# Patient Record
Sex: Female | Born: 1950
Health system: Southern US, Community
[De-identification: ages and names within clinical notes are randomized; demographics above are authoritative.]

## PROBLEM LIST (undated history)

## (undated) ENCOUNTER — Ambulatory Visit: Source: Home / Self Care

## (undated) DIAGNOSIS — E785 Hyperlipidemia, unspecified: Secondary | ICD-10-CM

## (undated) DIAGNOSIS — E119 Type 2 diabetes mellitus without complications: Secondary | ICD-10-CM

## (undated) DIAGNOSIS — Z8619 Personal history of other infectious and parasitic diseases: Secondary | ICD-10-CM

## (undated) DIAGNOSIS — M654 Radial styloid tenosynovitis [de Quervain]: Secondary | ICD-10-CM

## (undated) DIAGNOSIS — Z780 Asymptomatic menopausal state: Secondary | ICD-10-CM

## (undated) DIAGNOSIS — Z0000 Encounter for general adult medical examination without abnormal findings: Secondary | ICD-10-CM

## (undated) DIAGNOSIS — T8859XA Other complications of anesthesia, initial encounter: Secondary | ICD-10-CM

## (undated) DIAGNOSIS — R739 Hyperglycemia, unspecified: Secondary | ICD-10-CM

## (undated) DIAGNOSIS — Z9289 Personal history of other medical treatment: Secondary | ICD-10-CM

## (undated) DIAGNOSIS — M8580 Other specified disorders of bone density and structure, unspecified site: Secondary | ICD-10-CM

## (undated) DIAGNOSIS — C50919 Malignant neoplasm of unspecified site of unspecified female breast: Secondary | ICD-10-CM

## (undated) DIAGNOSIS — H6092 Unspecified otitis externa, left ear: Secondary | ICD-10-CM

## (undated) DIAGNOSIS — E8889 Other specified metabolic disorders: Secondary | ICD-10-CM

## (undated) HISTORY — DX: Personal history of other infectious and parasitic diseases: Z86.19

## (undated) HISTORY — DX: Personal history of other medical treatment: Z92.89

## (undated) HISTORY — DX: Other specified disorders of bone density and structure, unspecified site: M85.80

## (undated) HISTORY — DX: Encounter for general adult medical examination without abnormal findings: Z00.00

## (undated) HISTORY — DX: Unspecified otitis externa, left ear: H60.92

## (undated) HISTORY — DX: Hyperglycemia, unspecified: R73.9

## (undated) HISTORY — DX: Asymptomatic menopausal state: Z78.0

## (undated) HISTORY — DX: Hyperlipidemia, unspecified: E78.5

## (undated) HISTORY — DX: Radial styloid tenosynovitis (de quervain): M65.4

## (undated) HISTORY — DX: Other specified metabolic disorders: E88.89

## (undated) HISTORY — DX: Type 2 diabetes mellitus without complications: E11.9

## (undated) HISTORY — PX: SKIN SURGERY: SHX2413

## (undated) HISTORY — DX: Malignant neoplasm of unspecified site of unspecified female breast: C50.919

---

## 1970-11-01 HISTORY — PX: WISDOM TOOTH EXTRACTION: SHX21

## 2000-06-16 ENCOUNTER — Other Ambulatory Visit: Admission: RE | Admit: 2000-06-16 | Discharge: 2000-06-16 | Payer: Self-pay | Admitting: Obstetrics and Gynecology

## 2008-01-31 DIAGNOSIS — C50919 Malignant neoplasm of unspecified site of unspecified female breast: Secondary | ICD-10-CM

## 2008-01-31 DIAGNOSIS — Z853 Personal history of malignant neoplasm of breast: Secondary | ICD-10-CM | POA: Insufficient documentation

## 2008-01-31 HISTORY — DX: Malignant neoplasm of unspecified site of unspecified female breast: C50.919

## 2008-02-14 ENCOUNTER — Other Ambulatory Visit: Admission: RE | Admit: 2008-02-14 | Discharge: 2008-02-14 | Payer: Self-pay | Admitting: *Deleted

## 2008-03-01 HISTORY — PX: BREAST LUMPECTOMY: SHX2

## 2008-03-05 ENCOUNTER — Ambulatory Visit: Admission: RE | Admit: 2008-03-05 | Discharge: 2008-04-08 | Payer: Self-pay | Admitting: Radiation Oncology

## 2008-03-11 ENCOUNTER — Ambulatory Visit (HOSPITAL_COMMUNITY): Admission: RE | Admit: 2008-03-11 | Discharge: 2008-03-11 | Payer: Self-pay | Admitting: Surgery

## 2008-03-11 ENCOUNTER — Encounter (INDEPENDENT_AMBULATORY_CARE_PROVIDER_SITE_OTHER): Payer: Self-pay | Admitting: Surgery

## 2008-03-21 ENCOUNTER — Ambulatory Visit: Payer: Self-pay | Admitting: Oncology

## 2008-04-17 LAB — CBC WITH DIFFERENTIAL/PLATELET
BASO%: 0.2 % (ref 0.0–2.0)
EOS%: 2.9 % (ref 0.0–7.0)
HCT: 40.8 % (ref 34.8–46.6)
LYMPH%: 29.4 % (ref 14.0–48.0)
MCH: 29.1 pg (ref 26.0–34.0)
MCHC: 34.4 g/dL (ref 32.0–36.0)
NEUT%: 57.7 % (ref 39.6–76.8)
RBC: 4.82 10*6/uL (ref 3.70–5.32)
lymph#: 1.5 10*3/uL (ref 0.9–3.3)

## 2008-04-18 LAB — COMPREHENSIVE METABOLIC PANEL
ALT: 26 U/L (ref 0–35)
AST: 19 U/L (ref 0–37)
Chloride: 104 mEq/L (ref 96–112)
Creatinine, Ser: 0.88 mg/dL (ref 0.40–1.20)
Total Bilirubin: 0.6 mg/dL (ref 0.3–1.2)

## 2008-04-23 ENCOUNTER — Ambulatory Visit (HOSPITAL_COMMUNITY): Admission: RE | Admit: 2008-04-23 | Discharge: 2008-04-23 | Payer: Self-pay | Admitting: Oncology

## 2008-05-02 ENCOUNTER — Ambulatory Visit: Payer: Self-pay | Admitting: Oncology

## 2008-08-22 ENCOUNTER — Ambulatory Visit: Payer: Self-pay | Admitting: Oncology

## 2008-08-26 LAB — CBC WITH DIFFERENTIAL/PLATELET
Basophils Absolute: 0 10*3/uL (ref 0.0–0.1)
HCT: 39 % (ref 34.8–46.6)
HGB: 13.4 g/dL (ref 11.6–15.9)
LYMPH%: 34.8 % (ref 14.0–48.0)
MCHC: 34.4 g/dL (ref 32.0–36.0)
MONO#: 0.6 10*3/uL (ref 0.1–0.9)
NEUT%: 51 % (ref 39.6–76.8)
Platelets: 351 10*3/uL (ref 145–400)
WBC: 5.7 10*3/uL (ref 3.9–10.0)
lymph#: 2 10*3/uL (ref 0.9–3.3)

## 2008-08-27 LAB — COMPREHENSIVE METABOLIC PANEL
ALT: 24 U/L (ref 0–35)
AST: 17 U/L (ref 0–37)
Albumin: 4.9 g/dL (ref 3.5–5.2)
Alkaline Phosphatase: 65 U/L (ref 39–117)
Potassium: 4 mEq/L (ref 3.5–5.3)
Total Protein: 7.4 g/dL (ref 6.0–8.3)

## 2008-09-21 ENCOUNTER — Encounter: Admission: RE | Admit: 2008-09-21 | Discharge: 2008-09-21 | Payer: Self-pay | Admitting: Family Medicine

## 2009-01-22 ENCOUNTER — Ambulatory Visit: Payer: Self-pay | Admitting: Oncology

## 2009-01-24 LAB — CBC WITH DIFFERENTIAL/PLATELET
Basophils Absolute: 0 10*3/uL (ref 0.0–0.1)
EOS%: 2.4 % (ref 0.0–7.0)
HCT: 39 % (ref 34.8–46.6)
HGB: 13.2 g/dL (ref 11.6–15.9)
MCH: 29 pg (ref 25.1–34.0)
MCV: 85.8 fL (ref 79.5–101.0)
MONO%: 10.5 % (ref 0.0–14.0)
NEUT%: 47.5 % (ref 38.4–76.8)

## 2009-01-27 LAB — COMPREHENSIVE METABOLIC PANEL
AST: 19 U/L (ref 0–37)
Alkaline Phosphatase: 68 U/L (ref 39–117)
BUN: 22 mg/dL (ref 6–23)
Calcium: 10.1 mg/dL (ref 8.4–10.5)
Chloride: 104 mEq/L (ref 96–112)
Creatinine, Ser: 1 mg/dL (ref 0.40–1.20)

## 2009-08-06 ENCOUNTER — Ambulatory Visit: Payer: Self-pay | Admitting: Oncology

## 2009-08-11 LAB — CBC WITH DIFFERENTIAL/PLATELET
Eosinophils Absolute: 0.1 10*3/uL (ref 0.0–0.5)
HCT: 39.2 % (ref 34.8–46.6)
HGB: 13.4 g/dL (ref 11.6–15.9)
LYMPH%: 33 % (ref 14.0–49.7)
MONO#: 0.5 10*3/uL (ref 0.1–0.9)
NEUT#: 3.5 10*3/uL (ref 1.5–6.5)
NEUT%: 55.9 % (ref 38.4–76.8)
Platelets: 250 10*3/uL (ref 145–400)
WBC: 6.3 10*3/uL (ref 3.9–10.3)

## 2009-08-12 LAB — COMPREHENSIVE METABOLIC PANEL
CO2: 23 mEq/L (ref 19–32)
Calcium: 10.1 mg/dL (ref 8.4–10.5)
Chloride: 104 mEq/L (ref 96–112)
Creatinine, Ser: 0.87 mg/dL (ref 0.40–1.20)
Glucose, Bld: 106 mg/dL — ABNORMAL HIGH (ref 70–99)
Sodium: 139 mEq/L (ref 135–145)
Total Bilirubin: 0.7 mg/dL (ref 0.3–1.2)
Total Protein: 7.1 g/dL (ref 6.0–8.3)

## 2009-08-12 LAB — LACTATE DEHYDROGENASE: LDH: 172 U/L (ref 94–250)

## 2010-02-16 ENCOUNTER — Ambulatory Visit: Payer: Self-pay | Admitting: Oncology

## 2010-02-18 LAB — LACTATE DEHYDROGENASE: LDH: 156 U/L (ref 94–250)

## 2010-02-18 LAB — COMPREHENSIVE METABOLIC PANEL
ALT: 28 U/L (ref 0–35)
Albumin: 4.6 g/dL (ref 3.5–5.2)
BUN: 18 mg/dL (ref 6–23)
CO2: 25 mEq/L (ref 19–32)
Calcium: 9.6 mg/dL (ref 8.4–10.5)
Chloride: 102 mEq/L (ref 96–112)
Glucose, Bld: 124 mg/dL — ABNORMAL HIGH (ref 70–99)
Potassium: 4.2 mEq/L (ref 3.5–5.3)
Total Bilirubin: 0.8 mg/dL (ref 0.3–1.2)
Total Protein: 7.1 g/dL (ref 6.0–8.3)

## 2010-02-18 LAB — CBC WITH DIFFERENTIAL/PLATELET
HCT: 40.9 % (ref 34.8–46.6)
LYMPH%: 30.7 % (ref 14.0–49.7)
MCH: 29.4 pg (ref 25.1–34.0)
MONO%: 9.6 % (ref 0.0–14.0)
NEUT#: 3.6 10*3/uL (ref 1.5–6.5)
NEUT%: 54.6 % (ref 38.4–76.8)
WBC: 6.5 10*3/uL (ref 3.9–10.3)
lymph#: 2 10*3/uL (ref 0.9–3.3)

## 2010-02-18 LAB — VITAMIN D 25 HYDROXY (VIT D DEFICIENCY, FRACTURES): Vit D, 25-Hydroxy: 50 ng/mL (ref 30–89)

## 2010-02-18 LAB — CANCER ANTIGEN 27.29: CA 27.29: 21 U/mL (ref 0–39)

## 2010-08-25 ENCOUNTER — Ambulatory Visit: Payer: Self-pay | Admitting: Oncology

## 2010-08-27 LAB — CBC WITH DIFFERENTIAL/PLATELET
BASO%: 0.6 % (ref 0.0–2.0)
Eosinophils Absolute: 0.2 10*3/uL (ref 0.0–0.5)
HCT: 40.2 % (ref 34.8–46.6)
MCH: 30 pg (ref 25.1–34.0)
MCHC: 34.5 g/dL (ref 31.5–36.0)
MONO%: 10.3 % (ref 0.0–14.0)
NEUT#: 2.9 10*3/uL (ref 1.5–6.5)
RDW: 12.8 % (ref 11.2–14.5)
WBC: 5.5 10*3/uL (ref 3.9–10.3)

## 2010-08-28 LAB — COMPREHENSIVE METABOLIC PANEL
ALT: 20 U/L (ref 0–35)
AST: 20 U/L (ref 0–37)
Albumin: 4.7 g/dL (ref 3.5–5.2)
Alkaline Phosphatase: 69 U/L (ref 39–117)
CO2: 25 mEq/L (ref 19–32)
Calcium: 10.8 mg/dL — ABNORMAL HIGH (ref 8.4–10.5)
Chloride: 101 mEq/L (ref 96–112)
Creatinine, Ser: 0.96 mg/dL (ref 0.40–1.20)
Potassium: 4.1 mEq/L (ref 3.5–5.3)
Total Protein: 7.2 g/dL (ref 6.0–8.3)

## 2010-11-05 ENCOUNTER — Ambulatory Visit: Payer: Self-pay | Admitting: Oncology

## 2011-03-11 ENCOUNTER — Ambulatory Visit (INDEPENDENT_AMBULATORY_CARE_PROVIDER_SITE_OTHER): Payer: BC Managed Care – PPO | Admitting: Internal Medicine

## 2011-03-11 DIAGNOSIS — N951 Menopausal and female climacteric states: Secondary | ICD-10-CM

## 2011-03-11 DIAGNOSIS — E789 Disorder of lipoprotein metabolism, unspecified: Secondary | ICD-10-CM

## 2011-03-11 DIAGNOSIS — M899 Disorder of bone, unspecified: Secondary | ICD-10-CM

## 2011-03-15 ENCOUNTER — Encounter: Payer: Self-pay | Admitting: Internal Medicine

## 2011-03-16 NOTE — Op Note (Signed)
NAMEROSALINE, EZEKIEL                  ACCOUNT NO.:  [id number]   MEDICAL RECORD NO.:  [id number]          PATIENT TYPE:  AMB   LOCATION:  SDS                          FACILITY:  [facility]   PHYSICIAN:  Wilmon Arms. Corliss Skains, M.D. DATE OF BIRTH:  1951/03/04   DATE OF PROCEDURE:  03/11/2008  DATE OF DISCHARGE:  03/11/2008                               OPERATIVE REPORT   PREOPERATIVE DIAGNOSIS:  Right breast cancer.   POSTOPERATIVE DIAGNOSIS:  Right breast cancer.   PROCEDURE PERFORMED:  1. Blue dye injection.  2. Right axillary sentinel lymph node biopsy.  3. Right partial mastectomy.  4. MammoSite cavity evaluation device placement.  5. Intraoperative ultrasound.   SURGEON:  Wynona Luna, MD   ASSISTANT:  Kelle Darting. Rennis Harding, NP and Dr. Cicero Duck assisted with the  placement of a MammoSite balloon.   ANESTHESIA:  General endotracheal.   INDICATIONS:  The patient is a 60 year old female who presented with a  palpable mass in right breast.  She underwent extensive diagnostic  workup with several attempts at needle biopsy.  She finally had a  stereotactic core biopsy which returned a diagnosis of invasive mammary  cancer.  A breast-specific gamma imaging at Dr. Shirlee More office showed  only a single lesion with no other areas of activity.  She presents  today for partial mastectomy and sentinel lymph node biopsy MammoSite  balloon placement.  She was given preoperative antibiotics with Ancef.  She also had an injection of the radionuclide tracer in the holding  area.   DESCRIPTION OF PROCEDURE:  The patient was brought to the operating  room, placed in the supine position on operating table.  After an  adequate level of general anesthesia was obtained, the patient's right  breast was prepped with ChloraPrep.  A 5 mL of methylene blue were  injected subdermally under the nipple.  The breast was then massaged for  5 minutes.  The breast, axilla, and upper arm were then prepped with  Betadine and draped in a sterile fashion.  A time-out was taken to  assure proper patient and proper procedure.  The gamma probe was then  used to interrogate the axilla.  We made an incision over the area of  the most activity.  Dissection was carried down into the axillary  fascia.  Deep in the axilla, we encountered a blue dye with activity of  1450.  This was carefully excised.  This was marked as sentinel lymph  node #1.  We then interrogated the axilla again with the gamma probe.  There seemed to be more activity.  We spent some time searching for  another hot blue node.  However, we could not visualize another blue  node.  We did locate another lymph node and this had activity up to 981.  This was marked as sentinel lymph node #2.  Please note that this did  not seem to contain blue dye.  We then irrigated the axilla and packed  it with sponge.  We turned our attention to the partial mastectomy.  We  made a transverse incision  over the palpable mass.  Skin flaps were  raised in all directions.  We left about 1 centimeter of subcutaneous  tissue and breast tissue under the dermis.  We then went out laterally  in all directions from around the palpable mass.  We took this down to  just above the pectoralis fascia.  The specimen was removed in its  entirety.  The blue dye from the previous subareolar injection had  stained the inferior margin of the specimen.  The long suture was then  used to mark the lateral margin and the short suture superior.  The  specimen was then sent for pathologic examination.  At this point, we  received a verbal report that both sentinel lymph nodes were negative.  The wound was then thoroughly irrigated.  Hemostasis was obtained with  cautery.  We inserted the MammoSite cavity evaluation device through a  lateral stab incision into the wound.  We inflated to 35 cm and this  seemed to fill the wound fairly well.  There was a little undermining  laterally  which was closed with a single 3-0 Vicryl suture.  The balloon  was then deflated and the wound was closed with a deep layer of 3-0  Vicryl, maintaining about a 1-cm tissue margin anteriorly.  A 4-0  Monocryl was used to close the skin incisions.  Intraoperative  ultrasound was then used to interrogate the balloon.  There seemed to be  a 11-mm of tissue margin anteriorly.  There seemed to be several  millimeters of posterior margin on the chest wall.  The balloon had been  filled with 35 mL of saline mixed with radiopaque dye.  The axillary  incision was then closed with a deep layer of 3-0 Vicryl and  subcuticular 4-0 Monocryl.  Steri-Strips and clean dressings were  applied.  The patient was then extubated and brought to recovery room in  stable condition.  All sponge, instrument, and needle counts were  correct.      Wilmon Arms. Tsuei, M.D.  Electronically Signed     MKT/MEDQ  D:  03/11/2008  T:  03/12/2008  Job:  [id number]

## 2011-03-23 ENCOUNTER — Ambulatory Visit (INDEPENDENT_AMBULATORY_CARE_PROVIDER_SITE_OTHER): Payer: BC Managed Care – PPO | Admitting: Internal Medicine

## 2011-03-23 DIAGNOSIS — E785 Hyperlipidemia, unspecified: Secondary | ICD-10-CM

## 2011-03-23 DIAGNOSIS — N951 Menopausal and female climacteric states: Secondary | ICD-10-CM

## 2011-03-23 DIAGNOSIS — M899 Disorder of bone, unspecified: Secondary | ICD-10-CM

## 2011-03-23 DIAGNOSIS — R7309 Other abnormal glucose: Secondary | ICD-10-CM

## 2011-04-08 ENCOUNTER — Encounter (INDEPENDENT_AMBULATORY_CARE_PROVIDER_SITE_OTHER): Payer: Self-pay | Admitting: Surgery

## 2011-04-26 ENCOUNTER — Ambulatory Visit: Payer: BC Managed Care – PPO | Admitting: *Deleted

## 2011-05-04 ENCOUNTER — Encounter: Payer: BC Managed Care – PPO | Attending: Internal Medicine | Admitting: *Deleted

## 2011-05-04 DIAGNOSIS — E785 Hyperlipidemia, unspecified: Secondary | ICD-10-CM | POA: Insufficient documentation

## 2011-05-04 DIAGNOSIS — Z713 Dietary counseling and surveillance: Secondary | ICD-10-CM | POA: Insufficient documentation

## 2011-05-04 DIAGNOSIS — R7309 Other abnormal glucose: Secondary | ICD-10-CM | POA: Insufficient documentation

## 2011-05-14 ENCOUNTER — Encounter (HOSPITAL_BASED_OUTPATIENT_CLINIC_OR_DEPARTMENT_OTHER): Payer: BC Managed Care – PPO | Admitting: Oncology

## 2011-05-14 ENCOUNTER — Other Ambulatory Visit: Payer: Self-pay | Admitting: Oncology

## 2011-05-14 DIAGNOSIS — Z170 Estrogen receptor positive status [ER+]: Secondary | ICD-10-CM

## 2011-05-14 DIAGNOSIS — C50419 Malignant neoplasm of upper-outer quadrant of unspecified female breast: Secondary | ICD-10-CM

## 2011-05-14 LAB — COMPREHENSIVE METABOLIC PANEL
ALT: 35 U/L (ref 0–35)
AST: 30 U/L (ref 0–37)
Albumin: 4.9 g/dL (ref 3.5–5.2)
BUN: 18 mg/dL (ref 6–23)
Chloride: 102 mEq/L (ref 96–112)
Creatinine, Ser: 0.93 mg/dL (ref 0.50–1.10)
Glucose, Bld: 123 mg/dL — ABNORMAL HIGH (ref 70–99)
Sodium: 140 mEq/L (ref 135–145)
Total Protein: 7.2 g/dL (ref 6.0–8.3)

## 2011-05-14 LAB — CBC WITH DIFFERENTIAL/PLATELET
Basophils Absolute: 0 10*3/uL (ref 0.0–0.1)
Eosinophils Absolute: 0.2 10*3/uL (ref 0.0–0.5)
HCT: 40.1 % (ref 34.8–46.6)
MCH: 29.8 pg (ref 25.1–34.0)
NEUT#: 5 10*3/uL (ref 1.5–6.5)
NEUT%: 61.9 % (ref 38.4–76.8)
Platelets: 254 10*3/uL (ref 145–400)

## 2011-05-24 ENCOUNTER — Encounter (INDEPENDENT_AMBULATORY_CARE_PROVIDER_SITE_OTHER): Payer: Self-pay | Admitting: Surgery

## 2011-05-26 ENCOUNTER — Encounter (INDEPENDENT_AMBULATORY_CARE_PROVIDER_SITE_OTHER): Payer: Self-pay | Admitting: Surgery

## 2011-05-26 ENCOUNTER — Ambulatory Visit (INDEPENDENT_AMBULATORY_CARE_PROVIDER_SITE_OTHER): Payer: BC Managed Care – PPO | Admitting: Surgery

## 2011-05-26 ENCOUNTER — Ambulatory Visit: Payer: BC Managed Care – PPO | Admitting: Internal Medicine

## 2011-05-26 VITALS — BP 134/86 | HR 84 | Temp 97.4°F | Ht 62.0 in | Wt 171.8 lb

## 2011-05-26 DIAGNOSIS — Z853 Personal history of malignant neoplasm of breast: Secondary | ICD-10-CM | POA: Insufficient documentation

## 2011-05-26 NOTE — Patient Instructions (Addendum)
Follow-up with Dr. Donnie Coffin as instructed.

## 2011-05-26 NOTE — Progress Notes (Signed)
Followup of her right breast cancer, T1 C., N0, ER/PR positive, status post lumpectomy and MammoSite. She is in year 3 of Femara. Her last mammogram in May of this year was unremarkable. She continues to have firm scar tissue at her right lumpectomy site which seems to be slightly smaller in size. This is felt to be residual effect of the MammoSite treatment.  On examination her right lumpectomy incision is well healed. She has approximately 3-4 cm firm scar tissue under her incision. No other palpable masses in either breast. No lymphadenopathy. She has slight nipple retraction on the right due to the scar tissue but this is unchanged.  The patient follows with Dr. Donnie Coffin every 6 months. We will see her back on a p.r.n. basis.

## 2011-06-01 ENCOUNTER — Encounter (HOSPITAL_BASED_OUTPATIENT_CLINIC_OR_DEPARTMENT_OTHER): Payer: BC Managed Care – PPO | Admitting: Oncology

## 2011-06-03 ENCOUNTER — Ambulatory Visit: Payer: BC Managed Care – PPO | Admitting: Internal Medicine

## 2011-06-10 ENCOUNTER — Encounter: Payer: Self-pay | Admitting: Emergency Medicine

## 2011-06-10 ENCOUNTER — Other Ambulatory Visit (INDEPENDENT_AMBULATORY_CARE_PROVIDER_SITE_OTHER): Payer: BC Managed Care – PPO

## 2011-06-10 DIAGNOSIS — E785 Hyperlipidemia, unspecified: Secondary | ICD-10-CM

## 2011-06-10 DIAGNOSIS — R739 Hyperglycemia, unspecified: Secondary | ICD-10-CM

## 2011-06-10 DIAGNOSIS — N951 Menopausal and female climacteric states: Secondary | ICD-10-CM

## 2011-06-10 DIAGNOSIS — R7309 Other abnormal glucose: Secondary | ICD-10-CM

## 2011-06-10 LAB — CBC WITH DIFFERENTIAL/PLATELET
Basophils Absolute: 0.1 10*3/uL (ref 0.0–0.1)
Basophils Relative: 1 % (ref 0–1)
Eosinophils Absolute: 0.2 10*3/uL (ref 0.0–0.7)
MCH: 29.4 pg (ref 26.0–34.0)
MCHC: 33.3 g/dL (ref 30.0–36.0)
Neutro Abs: 2.6 10*3/uL (ref 1.7–7.7)
Neutrophils Relative %: 52 % (ref 43–77)
Platelets: 286 10*3/uL (ref 150–400)
RDW: 12.9 % (ref 11.5–15.5)

## 2011-06-10 LAB — LIPID PANEL
HDL: 52 mg/dL (ref >39)
LDL Cholesterol: 110 mg/dL — ABNORMAL HIGH (ref 0–99)
Total CHOL/HDL Ratio: 3.6 Ratio
Triglycerides: 134 mg/dL (ref <150)

## 2011-06-10 LAB — COMPREHENSIVE METABOLIC PANEL
ALT: 26 U/L (ref 0–35)
AST: 21 U/L (ref 0–37)
Alkaline Phosphatase: 67 U/L (ref 39–117)
Glucose, Bld: 118 mg/dL — ABNORMAL HIGH (ref 70–99)
Sodium: 138 mEq/L (ref 135–145)
Total Bilirubin: 1.2 mg/dL (ref 0.3–1.2)
Total Protein: 7.4 g/dL (ref 6.0–8.3)

## 2011-06-10 NOTE — Progress Notes (Signed)
This encounter was created in error - please disregard.  This encounter was created in error - please disregard.

## 2011-06-11 LAB — HEMOGLOBIN A1C: Mean Plasma Glucose: 151 mg/dL — ABNORMAL HIGH (ref <117)

## 2011-06-17 ENCOUNTER — Ambulatory Visit (INDEPENDENT_AMBULATORY_CARE_PROVIDER_SITE_OTHER): Payer: BC Managed Care – PPO | Admitting: Internal Medicine

## 2011-06-17 ENCOUNTER — Encounter: Payer: Self-pay | Admitting: Internal Medicine

## 2011-06-17 DIAGNOSIS — Z1272 Encounter for screening for malignant neoplasm of vagina: Secondary | ICD-10-CM

## 2011-06-17 DIAGNOSIS — Z01419 Encounter for gynecological examination (general) (routine) without abnormal findings: Secondary | ICD-10-CM

## 2011-06-17 DIAGNOSIS — R6882 Decreased libido: Secondary | ICD-10-CM

## 2011-06-17 DIAGNOSIS — M8580 Other specified disorders of bone density and structure, unspecified site: Secondary | ICD-10-CM

## 2011-06-17 DIAGNOSIS — E785 Hyperlipidemia, unspecified: Secondary | ICD-10-CM

## 2011-06-17 DIAGNOSIS — Z113 Encounter for screening for infections with a predominantly sexual mode of transmission: Secondary | ICD-10-CM

## 2011-06-17 DIAGNOSIS — R7309 Other abnormal glucose: Secondary | ICD-10-CM

## 2011-06-17 DIAGNOSIS — N951 Menopausal and female climacteric states: Secondary | ICD-10-CM

## 2011-06-17 DIAGNOSIS — M949 Disorder of cartilage, unspecified: Secondary | ICD-10-CM

## 2011-06-17 DIAGNOSIS — Z780 Asymptomatic menopausal state: Secondary | ICD-10-CM | POA: Insufficient documentation

## 2011-06-17 DIAGNOSIS — R7302 Impaired glucose tolerance (oral): Secondary | ICD-10-CM

## 2011-06-17 DIAGNOSIS — M654 Radial styloid tenosynovitis [de Quervain]: Secondary | ICD-10-CM | POA: Insufficient documentation

## 2011-06-17 DIAGNOSIS — E119 Type 2 diabetes mellitus without complications: Secondary | ICD-10-CM | POA: Insufficient documentation

## 2011-06-17 NOTE — Progress Notes (Signed)
Subjective:    Patient ID: Priscilla Butler, female    DOB: 05/07/1951, 60 y.o.   MRN: [id number]  HPI  Priscilla Butler is here for comprehensive evaluation.  Overall doing welll, she is exercising but admits to dietary indiscretion over the last 3 months.  She states FBS at home mostly in 120's occasionally 130's at home.  See AIC  6.9%.  She has no symptoms of polyuria, polyphagia, polydipsia.  Not sure if father had diabetes, no other pos. FH of diabetes.  She has low libido and would like testosterone checked.  No c/o's excessive vaginal dryness.  She discussed a drug holiday with Dr. Donnie Coffin for her osteopenia and he is in agreement  No Known Allergies Past Medical History  Diagnosis Date  . Hyperglycemia   . Hyperlipidemia   . Menopause   . Osteopenia   . DeQuervain's disease (tenosynovitis)   . Breast cancer 01/2008    Stage I, right breast   Past Surgical History  Procedure Date  . Wisdom tooth extraction 1972  . Breast lumpectomy 03/2008    Right breast   History   Social History  . Marital Status: Married    Spouse Name: Claris Gladden    Number of Children: 1  . Years of Education: BA   Occupational History  . Retired    Social History Main Topics  . Smoking status: Former Smoker    Quit date: 11/02/1999  . Smokeless tobacco: Never Used  . Alcohol Use: 1.8 oz/week    3 Cans of beer per week  . Drug Use: No  . Sexually Active: Yes -- Female partner(s)    Birth Control/ Protection: Post-menopausal   Other Topics Concern  . Not on file   Social History Narrative  . No narrative on file   Family History  Problem Relation Age of Onset  . Breast cancer Maternal Grandmother   . Cancer Maternal Grandfather     lung   Patient Active Problem List  Diagnoses  . HX: breast cancer   Current Outpatient Prescriptions on File Prior to Visit  Medication Sig Dispense Refill  . alendronate (FOSAMAX) 70 MG tablet Take 70 mg by mouth every 7 (seven) days. Take with a full glass of water on  an empty stomach.       Marland Kitchen aspirin 81 MG tablet Take 81 mg by mouth daily.        . calcium-vitamin D (OSCAL WITH D) 500-200 MG-UNIT per tablet Take 1 tablet by mouth daily.        . Cholecalciferol (VITAMIN D3) 1000 UNITS CAPS Take 1 capsule by mouth daily.        Marland Kitchen letrozole (FEMARA) 2.5 MG tablet Take 2.5 mg by mouth daily.        . Multiple Vitamin (MULTIVITAMIN) tablet Take 1 tablet by mouth daily.        . Omega-3 Fatty Acids (FISH OIL) 1000 MG CAPS Take 1 capsule by mouth 2 (two) times daily.        . simvastatin (ZOCOR) 40 MG tablet Take 40 mg by mouth at bedtime.             Review of Systems No Chest pain no SOB, no LE edema    Objective:   Physical Exam Physical Exam  Vital signs and nursing note reviewed  Constitutional: She is oriented to person, place, and time. She appears well-developed and well-nourished. She is cooperative.  HENT:  Head: Normocephalic and atraumatic.  Right Ear:  Tympanic membrane normal.  Left Ear: Tympanic membrane normal.  Nose: Nose normal.  Mouth/Throat: Oropharynx is clear and moist and mucous membranes are normal. No oropharyngeal exudate or posterior oropharyngeal erythema.  Eyes: Conjunctivae and EOM are normal. Pupils are equal, round, and reactive to light.  Neck: Neck supple. No JVD present. Carotid bruit is not present. No mass and no thyromegaly present.  Cardiovascular: Regular rhythm, normal heart sounds, intact distal pulses and normal pulses.  Exam reveals no gallop and no friction rub.   No murmur heard. Pulses:      Dorsalis pedis pulses are 2+ on the right side, and 2+ on the left side.  Pulmonary/Chest: Breath sounds normal. She has no wheezes. She has no rhonchi. She has no rales. Right breast exhibits no mass, no nipple discharge and no skin change. Left breast exhibits no mass, no nipple discharge and no skin change.  Abdominal: Soft. Bowel sounds are normal. She exhibits no distension and no mass. There is no  hepatosplenomegaly. There is no tenderness. There is no CVA tenderness.  Genitourinary: Rectum normal, vagina normal and uterus normal. Rectal exam shows no mass. Guaiac negative stool. No labial fusion. There is no lesion on the right labia. There is no lesion on the left labia. Cervix exhibits no motion tenderness. Right adnexum displays no mass, no tenderness and no fullness. Left adnexum displays no mass, no tenderness and no fullness. No erythema around the vagina.  Musculoskeletal:       No active synovitis to any joint.    Lymphadenopathy:       Right cervical: No superficial cervical adenopathy present.      Left cervical: No superficial cervical adenopathy present.       Right axillary: No pectoral and no lateral adenopathy present.       Left axillary: No pectoral and no lateral adenopathy present.      Right: No inguinal adenopathy present.       Left: No inguinal adenopathy present.  Neurological: She is alert and oriented to person, place, and time. She has normal strength and normal reflexes. No cranial nerve deficit or sensory deficit. She displays a negative Romberg sign. Coordination and gait normal.  Skin: Skin is warm and dry. No abrasion, no bruising, no ecchymosis and no rash noted. No cyanosis. Nails show no clubbing.  Psychiatric: She has a normal mood and affect. Her speech is normal and behavior is normal.          Assessment & Plan:   1)  HM See scanned HM sheet.  Bone density due in October.  OK to stop allendronate for now after having been on for 6 years.  UTD on Tdap and advised flu vaccine in fall.  UTD on mammogram, due for colonoscopy next year.  Pap today 2)  Impaired glucose with elevated AIC.  Will refer to Dr. Talmage Nap for possible GTT 3) Hyperlipidemis  Adequate control 4)  Stage I breast CA  Last mammogram neg.  Followed by Dr. Donnie Coffin 5)  Low libido  Will check testosterone today 6)  Menopause        Assessment & Plan:

## 2011-06-17 NOTE — Patient Instructions (Signed)
Call office for doctor order when due for bone density  Will refer to Dr. Talmage Nap for evaluation of  Impaired glucose tolerance  Take medicine as prescribed.  Office visit in 3-6 months

## 2011-06-22 ENCOUNTER — Encounter: Payer: Self-pay | Admitting: Emergency Medicine

## 2011-06-24 ENCOUNTER — Encounter: Payer: Self-pay | Admitting: Emergency Medicine

## 2011-08-06 ENCOUNTER — Inpatient Hospital Stay (INDEPENDENT_AMBULATORY_CARE_PROVIDER_SITE_OTHER)
Admission: RE | Admit: 2011-08-06 | Discharge: 2011-08-06 | Disposition: A | Discharge status: Home / Self Care | Payer: BC Managed Care – PPO | Source: Ambulatory Visit | Attending: Emergency Medicine | Admitting: Emergency Medicine

## 2011-08-06 DIAGNOSIS — N390 Urinary tract infection, site not specified: Secondary | ICD-10-CM

## 2011-08-06 LAB — POCT URINALYSIS DIP (DEVICE)
Bilirubin Urine: NEGATIVE
Glucose, UA: NEGATIVE mg/dL
Specific Gravity, Urine: 1.01 (ref 1.005–1.030)

## 2011-08-26 ENCOUNTER — Telehealth: Payer: Self-pay | Admitting: Internal Medicine

## 2011-08-26 NOTE — Telephone Encounter (Signed)
Please call pt and let her know that her bone density still shows osteopenia (thinning) but has improved from last check.  Continue calciuma nd vit d

## 2011-08-27 NOTE — Telephone Encounter (Signed)
Pt aware of results, will continue calcium and vitamin D supplement

## 2011-09-06 ENCOUNTER — Encounter: Payer: Self-pay | Admitting: Internal Medicine

## 2011-10-04 ENCOUNTER — Encounter: Payer: Self-pay | Admitting: Internal Medicine

## 2011-10-04 ENCOUNTER — Ambulatory Visit (INDEPENDENT_AMBULATORY_CARE_PROVIDER_SITE_OTHER): Payer: BC Managed Care – PPO | Admitting: Internal Medicine

## 2011-10-04 VITALS — BP 153/78 | HR 103 | Temp 98.5°F | Ht 62.5 in | Wt 172.0 lb

## 2011-10-04 DIAGNOSIS — Z23 Encounter for immunization: Secondary | ICD-10-CM

## 2011-10-04 DIAGNOSIS — IMO0001 Reserved for inherently not codable concepts without codable children: Secondary | ICD-10-CM

## 2011-10-04 DIAGNOSIS — T148XXA Other injury of unspecified body region, initial encounter: Secondary | ICD-10-CM

## 2011-10-04 DIAGNOSIS — IMO0002 Reserved for concepts with insufficient information to code with codable children: Secondary | ICD-10-CM

## 2011-10-04 MED ORDER — AMOXICILLIN-POT CLAVULANATE 500-125 MG PO TABS: 1.0000 | ORAL_TABLET | Freq: Three times a day (TID) | ORAL | Status: AC

## 2011-10-04 NOTE — Progress Notes (Signed)
Subjective:    Patient ID: Priscilla Butler, female    DOB: 1951-09-28, 60 y.o.   MRN: [id number]  HPI  Priscilla Butler is here for acute visit.  She reports she was bitten by her housecat yesteday.  She has a new dog and was picking up cat while dog was trying to chase the cat and cat panicked and scratched and bit her in R forearm.  Cat last rabies vaccine was 03/09/2010.  She cleaned wound with peroxide and OTC antibiotic creme.  Last tetanus 5 years ago - she reports she had Tdap  No Known Allergies Past Medical History  Diagnosis Date  . Hyperglycemia   . Menopause   . Osteopenia   . DeQuervain's disease (tenosynovitis)   . Breast cancer 01/2008    Stage I, right breast  . Hyperlipidemia    Past Surgical History  Procedure Date  . Wisdom tooth extraction 1972  . Breast lumpectomy 03/2008    Right breast   History   Social History  . Marital Status: Married    Spouse Name: Claris Gladden    Number of Children: 1  . Years of Education: BA   Occupational History  . Retired    Social History Main Topics  . Smoking status: Former Smoker    Quit date: 11/02/1999  . Smokeless tobacco: Never Used  . Alcohol Use: 1.8 oz/week    3 Cans of beer per week  . Drug Use: No  . Sexually Active: Yes -- Female partner(s)    Birth Control/ Protection: Post-menopausal   Other Topics Concern  . Not on file   Social History Narrative  . No narrative on file   Family History  Problem Relation Age of Onset  . Breast cancer Maternal Grandmother   . Cancer Maternal Grandfather     lung   Patient Active Problem List  Diagnoses  . HX: breast cancer  . Breast cancer  . Hyperlipidemia  . Hyperglycemia  . DeQuervain's disease (tenosynovitis)  . Menopause  . Osteopenia   Current Outpatient Prescriptions on File Prior to Visit  Medication Sig Dispense Refill  . aspirin 81 MG tablet Take 81 mg by mouth daily.        . calcium-vitamin D (OSCAL WITH D) 500-200 MG-UNIT per tablet Take 1 tablet by mouth daily.         . Cholecalciferol (VITAMIN D3) 1000 UNITS CAPS Take 1 capsule by mouth daily.        Marland Kitchen letrozole (FEMARA) 2.5 MG tablet Take 2.5 mg by mouth daily.        . Multiple Vitamin (MULTIVITAMIN) tablet Take 1 tablet by mouth daily.        . Omega-3 Fatty Acids (FISH OIL) 1000 MG CAPS Take 1 capsule by mouth 2 (two) times daily.        . simvastatin (ZOCOR) 40 MG tablet Take 40 mg by mouth at bedtime.        Marland Kitchen alendronate (FOSAMAX) 70 MG tablet Take 70 mg by mouth every 7 (seven) days. Take with a full glass of water on an empty stomach.           Review of Systems See HPI    Objective:   Physical Exam Physical Exam  Nursing note and vitals reviewed.  Constitutional: She is oriented to person, place, and time. She appears well-developed and well-nourished.  HENT:  Head: Normocephalic and atraumatic.  Cardiovascular: Normal rate and regular rhythm. Exam reveals no gallop and no friction  rub.  No murmur heard.  Pulmonary/Chest: Breath sounds normal. She has no wheezes. She has no rales.  Neurological: She is alert and oriented to person, place, and time.  Skin: Skin is warm and dry.   R forearm  Linear excoriations from cat scratches .  Two puncture wounds near lateral wrist.  Surrounding cellulitis from puncture wounds.  N-V status in intact .  Tendon testing of median, radial and ulnar nerve intact  Movements.  Good thumb apposition to all finger Psychiatric: She has a normal mood and affect. Her behavior is normal.        Assessment & Plan:  1) Cat bite:  Will give Augmentin 500/125 tid for 10 days.  Tetanus today  OTC neosporin for next 3 days.  Pt counseled if any increasing redness or pain to calloffice as we will need to get Xray looking for bony involement.  She voices understanding

## 2011-10-04 NOTE — Patient Instructions (Signed)
Call office  in one week and report to nurse   If any worsening redness or pain in area of cat bite,  Make appt to be seen

## 2011-10-11 ENCOUNTER — Telehealth: Payer: Self-pay | Admitting: Emergency Medicine

## 2011-10-11 NOTE — Telephone Encounter (Signed)
Priscilla Butler called, left message on voice mail at 315pm today.  She states that she has no redness at the site of her cat bite, healing well.  She states you can barely see where the bite was at all.

## 2011-10-13 ENCOUNTER — Other Ambulatory Visit: Payer: Self-pay | Admitting: *Deleted

## 2011-10-13 DIAGNOSIS — C50419 Malignant neoplasm of upper-outer quadrant of unspecified female breast: Secondary | ICD-10-CM

## 2011-10-13 MED ORDER — LETROZOLE 2.5 MG PO TABS: 2.5000 mg | ORAL_TABLET | Freq: Every day | ORAL | Status: DC

## 2011-11-02 HISTORY — PX: MOHS SURGERY: SUR867

## 2011-11-26 ENCOUNTER — Other Ambulatory Visit: Payer: Self-pay | Admitting: Physician Assistant

## 2011-11-26 ENCOUNTER — Other Ambulatory Visit: Payer: BC Managed Care – PPO | Admitting: Lab

## 2011-11-26 ENCOUNTER — Ambulatory Visit: Payer: BC Managed Care – PPO

## 2011-11-26 ENCOUNTER — Other Ambulatory Visit (HOSPITAL_BASED_OUTPATIENT_CLINIC_OR_DEPARTMENT_OTHER): Payer: BC Managed Care – PPO | Admitting: Lab

## 2011-11-26 DIAGNOSIS — C50919 Malignant neoplasm of unspecified site of unspecified female breast: Secondary | ICD-10-CM

## 2011-11-26 LAB — CBC WITH DIFFERENTIAL/PLATELET
Basophils Absolute: 0 10*3/uL (ref 0.0–0.1)
EOS%: 3.3 % (ref 0.0–7.0)
Eosinophils Absolute: 0.2 10*3/uL (ref 0.0–0.5)
HCT: 40.9 % (ref 34.8–46.6)
HGB: 13.9 g/dL (ref 11.6–15.9)
LYMPH%: 31.6 % (ref 14.0–49.7)
MCH: 29.8 pg (ref 25.1–34.0)
MCV: 87.2 fL (ref 79.5–101.0)
MONO%: 9.8 % (ref 0.0–14.0)
NEUT#: 3.5 10*3/uL (ref 1.5–6.5)
NEUT%: 54.7 % (ref 38.4–76.8)
Platelets: 272 10*3/uL (ref 145–400)

## 2011-11-26 LAB — COMPREHENSIVE METABOLIC PANEL
ALT: 21 U/L (ref 0–35)
AST: 21 U/L (ref 0–37)
CO2: 28 mEq/L (ref 19–32)
Creatinine, Ser: 0.89 mg/dL (ref 0.50–1.10)
Sodium: 141 mEq/L (ref 135–145)
Total Bilirubin: 0.8 mg/dL (ref 0.3–1.2)
Total Protein: 7 g/dL (ref 6.0–8.3)

## 2011-12-03 ENCOUNTER — Ambulatory Visit: Payer: BC Managed Care – PPO | Admitting: Oncology

## 2011-12-08 ENCOUNTER — Telehealth: Payer: Self-pay | Admitting: Oncology

## 2011-12-08 ENCOUNTER — Ambulatory Visit (HOSPITAL_BASED_OUTPATIENT_CLINIC_OR_DEPARTMENT_OTHER): Payer: BC Managed Care – PPO | Admitting: Oncology

## 2011-12-08 DIAGNOSIS — C50919 Malignant neoplasm of unspecified site of unspecified female breast: Secondary | ICD-10-CM

## 2011-12-08 DIAGNOSIS — E559 Vitamin D deficiency, unspecified: Secondary | ICD-10-CM

## 2011-12-08 NOTE — Progress Notes (Signed)
Hematology and Oncology Follow Up Visit  Priscilla Butler [id number] 1951-10-15 61 y.o. 12/08/2011 2:28 PM PCP Levon Hedger Principle Diagnosis: T1 C. N0 ER/PR positive breast cancer status post lumpectomy 03/11/2008, status post radiation therapy completed 03/23/2008, status post MammoSite therapy currently on Femara.  Interim History:  There have been no intercurrent illness, hospitalizations or medication changes. She has discontinued Fosamax Medications: I have reviewed the patient's current medications.  Allergies: No Known Allergies  Past Medical History, Surgical history, Social history, and Family History were reviewed and updated.  Review of Systems: Constitutional:  Negative for fever, chills, night sweats, anorexia, weight loss, pain. Cardiovascular: no chest pain or dyspnea on exertion Respiratory: no cough, shortness of breath, or wheezing Neurological: no TIA or stroke symptoms Dermatological: negative ENT: negative Skin Gastrointestinal: no abdominal pain, change in bowel habits, or black or bloody stools Genito-Urinary: negative Hematological and Lymphatic: negative Breast: negative Musculoskeletal: negative Remaining ROS negative.  Physical Exam: Blood pressure 136/79, pulse 102, temperature 98.9 F (37.2 C), temperature source Oral, height 5' 2.5" (1.588 m), weight 172 lb (78.019 kg). ECOG: 0 General appearance: alert, cooperative and appears stated age Head: Normocephalic, without obvious abnormality, atraumatic Neck: no adenopathy, no carotid bruit, no JVD, supple, symmetrical, trachea midline and thyroid not enlarged, symmetric, no tenderness/mass/nodules Lymph nodes: Cervical, supraclavicular, and axillary nodes normal. Cardiac : regular rate and rhythm Pulmonary:clear to auscultation bilaterally and normal percussion bilaterally Breasts: inspection negative, no nipple discharge or bleeding, no masses or nodularity palpable, that is post lumpectomy right  breast with associated seroma secondary to MammoSite catheter. Abdomen:soft, non-tender; bowel sounds normal; no masses,  no organomegaly Extremities negative Neuro: alert, oriented, normal speech, no focal findings or movement disorder noted  Lab Results: Lab Results  Component Value Date   WBC 6.5 11/26/2011   HGB 13.9 11/26/2011   HCT 40.9 11/26/2011   MCV 87.2 11/26/2011   PLT 272 11/26/2011     Chemistry      Component Value Date/Time   NA 141 11/26/2011 1026   K 3.9 11/26/2011 1026   CL 103 11/26/2011 1026   CO2 28 11/26/2011 1026   BUN 22 11/26/2011 1026   CREATININE 0.89 11/26/2011 1026   CREATININE 0.91 06/10/2011 0905      Component Value Date/Time   CALCIUM 10.4 11/26/2011 1026   ALKPHOS 80 11/26/2011 1026   AST 21 11/26/2011 1026   ALT 21 11/26/2011 1026   BILITOT 0.8 11/26/2011 1026      .pathology. Radiological Studies: chest X-ray NA Mammogram Due in May 2013 Bone density Last study within normal limits  Impression and Plan: Melanee Left is doing well. I will see her in 6 months with appropriate imaging studies. She will continue Femara which she tolerates well. She is expecting a grandchild which will be adopted from [place].  More than 50% of the visit was spent in patient-related counselling   Pierce Crane, MD 2/6/20132:28 PM

## 2011-12-08 NOTE — Telephone Encounter (Signed)
gve the pt her aug 2013 appts

## 2012-01-04 ENCOUNTER — Other Ambulatory Visit: Payer: Self-pay | Admitting: Oncology

## 2012-01-04 DIAGNOSIS — C50919 Malignant neoplasm of unspecified site of unspecified female breast: Secondary | ICD-10-CM

## 2012-03-28 ENCOUNTER — Other Ambulatory Visit: Payer: Self-pay | Admitting: Internal Medicine

## 2012-03-30 ENCOUNTER — Encounter: Payer: Self-pay | Admitting: *Deleted

## 2012-06-07 ENCOUNTER — Other Ambulatory Visit (HOSPITAL_BASED_OUTPATIENT_CLINIC_OR_DEPARTMENT_OTHER): Payer: BC Managed Care – PPO

## 2012-06-07 DIAGNOSIS — C50919 Malignant neoplasm of unspecified site of unspecified female breast: Secondary | ICD-10-CM

## 2012-06-07 DIAGNOSIS — C50419 Malignant neoplasm of upper-outer quadrant of unspecified female breast: Secondary | ICD-10-CM

## 2012-06-07 DIAGNOSIS — E559 Vitamin D deficiency, unspecified: Secondary | ICD-10-CM

## 2012-06-07 LAB — CBC WITH DIFFERENTIAL/PLATELET
EOS%: 2.2 % (ref 0.0–7.0)
Eosinophils Absolute: 0.2 10*3/uL (ref 0.0–0.5)
LYMPH%: 27.6 % (ref 14.0–49.7)
MCH: 29.1 pg (ref 25.1–34.0)
MCHC: 33.2 g/dL (ref 31.5–36.0)
MCV: 87.6 fL (ref 79.5–101.0)
MONO%: 7.1 % (ref 0.0–14.0)
NEUT#: 4.6 10*3/uL (ref 1.5–6.5)
Platelets: 245 10*3/uL (ref 145–400)
RBC: 4.7 10*6/uL (ref 3.70–5.45)

## 2012-06-08 LAB — COMPREHENSIVE METABOLIC PANEL
AST: 24 U/L (ref 0–37)
Alkaline Phosphatase: 77 U/L (ref 39–117)
Glucose, Bld: 120 mg/dL — ABNORMAL HIGH (ref 70–99)
Sodium: 138 mEq/L (ref 135–145)
Total Bilirubin: 1.1 mg/dL (ref 0.3–1.2)
Total Protein: 6.9 g/dL (ref 6.0–8.3)

## 2012-06-15 ENCOUNTER — Telehealth: Payer: Self-pay | Admitting: *Deleted

## 2012-06-15 ENCOUNTER — Encounter: Payer: Self-pay | Admitting: Family

## 2012-06-15 ENCOUNTER — Ambulatory Visit (HOSPITAL_BASED_OUTPATIENT_CLINIC_OR_DEPARTMENT_OTHER): Payer: BC Managed Care – PPO | Admitting: Family

## 2012-06-15 VITALS — BP 124/76 | HR 89 | Temp 98.6°F | Resp 20 | Ht 62.5 in | Wt 165.0 lb

## 2012-06-15 DIAGNOSIS — M899 Disorder of bone, unspecified: Secondary | ICD-10-CM

## 2012-06-15 DIAGNOSIS — C50419 Malignant neoplasm of upper-outer quadrant of unspecified female breast: Secondary | ICD-10-CM

## 2012-06-15 DIAGNOSIS — M949 Disorder of cartilage, unspecified: Secondary | ICD-10-CM

## 2012-06-15 DIAGNOSIS — Z853 Personal history of malignant neoplasm of breast: Secondary | ICD-10-CM

## 2012-06-15 NOTE — Progress Notes (Signed)
Hematology and Oncology Follow Up Visit  Priscilla Butler [id number] 1951-08-08 61 y.o. 06/15/2012 3:17 PM PCP Levon Hedger Principle Diagnosis: T1 C. N0 ER/PR positive breast cancer status post lumpectomy 03/11/2008, status post radiation therapy completed 03/23/2008, status post MammoSite therapy currently on Femara.  Interim History:  There have been no intercurrent illness, hospitalizations or medication changes. She has discontinued Fosamax. Last bone density showed osteopenia. Has occasional hot flash on Femara, no arthralgias, no vaginal dryness.  No headache or blurred vision. No cough or shortness of breath. No abdominal pain or new bone pain. Bowel and bladder function are normal. Appetite is good, with adequate fluid intake.No self detected breast problems or complaints.  Remainder of the 10 point  review of systems is negative.  Medications: I have reviewed the patient's current medications.  Allergies: No Known Allergies  Past Medical History, Surgical history, Social history, and Family History were reviewed and updated.  Physical Exam: Blood pressure 124/76, pulse 89, temperature 98.6 F (37 C), temperature source Oral, resp. rate 20, height 5' 2.5" (1.588 m), weight 165 lb (74.844 kg). ECOG: 0 General appearance: alert, cooperative and appears stated age Head: Normocephalic, without obvious abnormality, atraumatic Neck: no adenopathy, no carotid bruit, no JVD, supple, symmetrical, trachea midline and thyroid not enlarged, symmetric, no tenderness/mass/nodules Lymph nodes: Cervical, supraclavicular, and axillary nodes normal. Cardiac : regular rate and rhythm Pulmonary:clear to auscultation bilaterally and normal percussion bilaterally Abdomen:soft, non-tender; bowel sounds normal; no masses,  no organomegaly Extremities negative Neuro: alert, oriented, normal speech, no focal findings or movement disorder noted BREAST EXAM: In the supine position, with the right arm over  the head, the right nipple is everted. No periareolar edema or nipple discharge. No mass in any quadrant or subareolar region. No redness of the skin. No right axillary adenopathy. With the left arm over the head, the left nipple is flattened and slightly raised upward. At the 10-2 o'clock position, a well healed circumareolar scar. No periareolar edema or nipple discharge. No mass in any quadrant or subareolar region. No redness of the skin. No left axillary adenopathy.   Lab Results: Lab Results  Component Value Date   WBC 7.3 06/07/2012   HGB 13.7 06/07/2012   HCT 41.2 06/07/2012   MCV 87.6 06/07/2012   PLT 245 06/07/2012     Chemistry      Component Value Date/Time   NA 138 06/07/2012 1225   K 3.9 06/07/2012 1225   CL 103 06/07/2012 1225   CO2 29 06/07/2012 1225   BUN 23 06/07/2012 1225   CREATININE 0.88 06/07/2012 1225   CREATININE 0.91 06/10/2011 0905      Component Value Date/Time   CALCIUM 10.3 06/07/2012 1225   ALKPHOS 77 06/07/2012 1225   AST 24 06/07/2012 1225   ALT 30 06/07/2012 1225   BILITOT 1.1 06/07/2012 1225       Impression:  1. History of right breast cancer, no evidence of recurrence mammographically or clinically.  2. On Femara with good tolerance.  3. Mammo 03/21/12, no evidence of malignancy.  4. Bone density Oct. 2012, osteopenia.   Plan: 1. Remain on Femara, 5 years planned.  2. Return in 6 months to see Dr. Donnie Coffin.    Colman Cater, FNP 8/15/20133:17 PM

## 2012-06-15 NOTE — Patient Instructions (Addendum)
Return in 6 months with Dr. Rubin 

## 2012-06-15 NOTE — Telephone Encounter (Signed)
Gave patient appointment for six months with dr.rubin no labs needed per the orders from 06-15-2012

## 2012-06-27 ENCOUNTER — Other Ambulatory Visit: Payer: Self-pay | Admitting: Oncology

## 2012-11-27 ENCOUNTER — Telehealth: Payer: Self-pay | Admitting: Internal Medicine

## 2012-11-27 DIAGNOSIS — R739 Hyperglycemia, unspecified: Secondary | ICD-10-CM

## 2012-11-27 DIAGNOSIS — E785 Hyperlipidemia, unspecified: Secondary | ICD-10-CM

## 2012-11-27 DIAGNOSIS — C50919 Malignant neoplasm of unspecified site of unspecified female breast: Secondary | ICD-10-CM

## 2012-11-27 DIAGNOSIS — Z780 Asymptomatic menopausal state: Secondary | ICD-10-CM

## 2012-11-27 NOTE — Telephone Encounter (Signed)
Pt states she would like to have her blood work done before her physical done on 02.03.14... Ad please call her at [phone]

## 2012-11-28 NOTE — Telephone Encounter (Signed)
[name]  Call pt and let her know that she can come in for fasting labs.  She is to see you for requisition at the front desk before going upstairs  Thanks

## 2012-11-30 ENCOUNTER — Encounter: Payer: Self-pay | Admitting: *Deleted

## 2012-11-30 NOTE — Progress Notes (Signed)
Called and spoke with patient about rescheduling her appt. Confirmed appt for 12/08/12 at 1100 with Magda Bernheim, NP. Then will become Dr. Darnelle Catalan.

## 2012-12-01 ENCOUNTER — Encounter: Payer: Self-pay | Admitting: Oncology

## 2012-12-04 ENCOUNTER — Ambulatory Visit (HOSPITAL_BASED_OUTPATIENT_CLINIC_OR_DEPARTMENT_OTHER)
Admission: RE | Admit: 2012-12-04 | Discharge: 2012-12-04 | Disposition: A | Discharge status: Home / Self Care | Payer: BC Managed Care – PPO | Source: Ambulatory Visit | Attending: Internal Medicine | Admitting: Internal Medicine

## 2012-12-04 ENCOUNTER — Encounter: Payer: Self-pay | Admitting: Internal Medicine

## 2012-12-04 ENCOUNTER — Ambulatory Visit (INDEPENDENT_AMBULATORY_CARE_PROVIDER_SITE_OTHER): Payer: BC Managed Care – PPO | Admitting: Internal Medicine

## 2012-12-04 VITALS — BP 129/85 | HR 86 | Temp 97.1°F | Resp 18 | Ht 62.0 in | Wt 164.0 lb

## 2012-12-04 DIAGNOSIS — E785 Hyperlipidemia, unspecified: Secondary | ICD-10-CM

## 2012-12-04 DIAGNOSIS — M899 Disorder of bone, unspecified: Secondary | ICD-10-CM

## 2012-12-04 DIAGNOSIS — C4491 Basal cell carcinoma of skin, unspecified: Secondary | ICD-10-CM | POA: Insufficient documentation

## 2012-12-04 DIAGNOSIS — M8580 Other specified disorders of bone density and structure, unspecified site: Secondary | ICD-10-CM

## 2012-12-04 DIAGNOSIS — Z0000 Encounter for general adult medical examination without abnormal findings: Secondary | ICD-10-CM

## 2012-12-04 DIAGNOSIS — R739 Hyperglycemia, unspecified: Secondary | ICD-10-CM

## 2012-12-04 DIAGNOSIS — Z1389 Encounter for screening for other disorder: Secondary | ICD-10-CM | POA: Insufficient documentation

## 2012-12-04 DIAGNOSIS — C50919 Malignant neoplasm of unspecified site of unspecified female breast: Secondary | ICD-10-CM

## 2012-12-04 DIAGNOSIS — Z139 Encounter for screening, unspecified: Secondary | ICD-10-CM

## 2012-12-04 DIAGNOSIS — R7309 Other abnormal glucose: Secondary | ICD-10-CM

## 2012-12-04 DIAGNOSIS — M79609 Pain in unspecified limb: Secondary | ICD-10-CM | POA: Insufficient documentation

## 2012-12-04 DIAGNOSIS — Z23 Encounter for immunization: Secondary | ICD-10-CM

## 2012-12-04 LAB — CBC WITH DIFFERENTIAL/PLATELET
Eosinophils Relative: 2 % (ref 0–5)
HCT: 42.6 % (ref 36.0–46.0)
Lymphocytes Relative: 39 % (ref 12–46)
Lymphs Abs: 2.3 10*3/uL (ref 0.7–4.0)
MCV: 84.5 fL (ref 78.0–100.0)
Monocytes Absolute: 0.6 10*3/uL (ref 0.1–1.0)
RBC: 5.04 MIL/uL (ref 3.87–5.11)
RDW: 13.6 % (ref 11.5–15.5)
WBC: 5.9 10*3/uL (ref 4.0–10.5)

## 2012-12-04 LAB — COMPREHENSIVE METABOLIC PANEL
BUN: 19 mg/dL (ref 6–23)
CO2: 28 mEq/L (ref 19–32)
Calcium: 9.9 mg/dL (ref 8.4–10.5)
Chloride: 102 mEq/L (ref 96–112)
Creat: 0.79 mg/dL (ref 0.50–1.10)
Glucose, Bld: 128 mg/dL — ABNORMAL HIGH (ref 70–99)

## 2012-12-04 LAB — POCT URINALYSIS DIPSTICK
Bilirubin, UA: NEGATIVE
Blood, UA: NEGATIVE
Glucose, UA: NEGATIVE
Nitrite, UA: NEGATIVE
Spec Grav, UA: <=1.005

## 2012-12-04 LAB — LIPID PANEL
Cholesterol: 197 mg/dL (ref 0–200)
HDL: 58 mg/dL (ref >39)
Total CHOL/HDL Ratio: 3.4 Ratio

## 2012-12-04 MED ORDER — GLUCOSE BLOOD VI STRP: ORAL_STRIP | Status: DC

## 2012-12-04 NOTE — Progress Notes (Signed)
Subjective:    Patient ID: Priscilla Butler, female    DOB: [birth year], 62 y.o.   MRN: [id number]  HPI Priscilla Butler is here for CPE    Hyperglycemia  At home she states sugars running 120's.  She is very happy that she has been exercising regularly and has lost 8 lbs since last visit.  She is also watching diet.    She has pain in L foot .  Injury several months ago when she kicked a piece of granite and when she works out she has pain along bottom and lateral side of L foot.    Stage I breast cancer.  She is on her 5th year of Femara.  She will make appt with Dr. Darnelle Catalan soon  She has also noted a separation in the bones of her scalp.  NOt sure how long this has been there. No injury or trauma  Recent basal cell skin cancer removed from face upper lip.    Rosary states she will be going on  A trek to [place] with her husband in April   Review of Systems  Musculoskeletal: Negative for joint swelling.  All other systems reviewed and are negative.       Objective:   Physical Exam Physical Exam  Nursing note and vitals reviewed.  Constitutional: She is oriented to person, place, and time. She appears well-developed and well-nourished.  HENT:  Head: Normocephalic and atraumatic.  She does appear to have a separation of ??? Midline anastomsis of skull on exam Right Ear: Tympanic membrane and ear canal normal. No drainage. Tympanic membrane is not injected and not erythematous.  Left Ear: Tympanic membrane and ear canal normal. No drainage. Tympanic membrane is not injected and not erythematous.  Nose: Nose normal. Right sinus exhibits no maxillary sinus tenderness and no frontal sinus tenderness. Left sinus exhibits no maxillary sinus tenderness and no frontal sinus tenderness.  Mouth/Throat: Oropharynx is clear and moist. No oral lesions. No oropharyngeal exudate.  Eyes: Conjunctivae and EOM are normal. Pupils are equal, round, and reactive to light.  Neck: Normal range of motion. Neck supple. No  JVD present. Carotid bruit is not present. No mass and no thyromegaly present.  Cardiovascular: Normal rate, regular rhythm, S1 normal, S2 normal and intact distal pulses. Exam reveals no gallop and no friction rub.  No murmur heard.  Pulses:  Carotid pulses are 2+ on the right side, and 2+ on the left side.  Dorsalis pedis pulses are 2+ on the right side, and 2+ on the left side.  No carotid bruit. No LE edema  Pulmonary/Chest: Breath sounds normal. She has no wheezes. She has no rales. She exhibits no tenderness.  Breast exam.  R breast thickening above R lumpectomy scar.  No discrete masses no nipple discharge no axillary adenopathy.  L breeast  No discrete masses no nipple discharge no axillary adenopathy Abdominal: Soft. Bowel sounds are normal. She exhibits no distension and no mass. There is no hepatosplenomegaly. There is no tenderness. There is no CVA tenderness.   Guaiac neg Musculoskeletal: Normal range of motion.  No active synovitis to joints.  Lymphadenopathy:  She has no cervical adenopathy.  She has no axillary adenopathy.  Right: No inguinal and no supraclavicular adenopathy present.  Left: No inguinal and no supraclavicular adenopathy present.  Neurological: She is alert and oriented to person, place, and time. She has normal strength and normal reflexes. She displays no tremor. No cranial nerve deficit or sensory deficit. Coordination and gait  normal.  Skin: Skin is warm and dry. No rash noted. No cyanosis. Nails show no clubbing.  Psychiatric: She has a normal mood and affect. Her speech is normal and behavior is normal. Cognition and memory are normal.           Assessment & Plan:  Heatlh Maintenance:  UTD with vaccines  Will give Pneumovax today.   MM due in May,  Dexa in October  Hyperglycemia :  Will check today with HGB aic  Hyperlipidemia   Check today  L foot pain  Will get xray today  ?? Skeletal defect of skull will xray today  Osteopenia  Due for Dexa  in October.  Off Actonel now  Stage I breast CA  Will make appt with Dr. Darnelle Catalan soon  Basal cell skin CA  Advised to check with [facility] Travel clinic for appropriate vaccine prior to trip to [place]

## 2012-12-04 NOTE — Patient Instructions (Signed)
Check with [employer] about "low dose radiation screening CT of chest"  For 40 year smoking  Labs will be mailed to you

## 2012-12-05 ENCOUNTER — Encounter: Payer: Self-pay | Admitting: *Deleted

## 2012-12-05 ENCOUNTER — Telehealth: Payer: Self-pay | Admitting: Internal Medicine

## 2012-12-05 DIAGNOSIS — M79673 Pain in unspecified foot: Secondary | ICD-10-CM

## 2012-12-05 LAB — HEMOGLOBIN A1C: Mean Plasma Glucose: 137 mg/dL — ABNORMAL HIGH (ref <117)

## 2012-12-05 LAB — VITAMIN D 25 HYDROXY (VIT D DEFICIENCY, FRACTURES): Vit D, 25-Hydroxy: 71 ng/mL (ref 30–89)

## 2012-12-05 NOTE — Telephone Encounter (Signed)
Spoke with pt and informed of foot and skull xray results  Will refer to orthopedics Dr. Charlett Blake.  Pt voices understanding

## 2012-12-06 ENCOUNTER — Encounter: Payer: Self-pay | Admitting: *Deleted

## 2012-12-08 ENCOUNTER — Ambulatory Visit (HOSPITAL_BASED_OUTPATIENT_CLINIC_OR_DEPARTMENT_OTHER): Payer: BC Managed Care – PPO | Admitting: Family

## 2012-12-08 ENCOUNTER — Telehealth: Payer: Self-pay | Admitting: Oncology

## 2012-12-08 ENCOUNTER — Encounter: Payer: Self-pay | Admitting: Family

## 2012-12-08 VITALS — BP 130/86 | HR 86 | Temp 98.4°F | Resp 20 | Ht 62.0 in | Wt 166.0 lb

## 2012-12-08 DIAGNOSIS — C50419 Malignant neoplasm of upper-outer quadrant of unspecified female breast: Secondary | ICD-10-CM

## 2012-12-08 DIAGNOSIS — Z853 Personal history of malignant neoplasm of breast: Secondary | ICD-10-CM

## 2012-12-08 DIAGNOSIS — Z170 Estrogen receptor positive status [ER+]: Secondary | ICD-10-CM

## 2012-12-08 DIAGNOSIS — C50919 Malignant neoplasm of unspecified site of unspecified female breast: Secondary | ICD-10-CM

## 2012-12-08 MED ORDER — LETROZOLE 2.5 MG PO TABS: 2.5000 mg | ORAL_TABLET | Freq: Every day | ORAL | Status: DC

## 2012-12-08 NOTE — Patient Instructions (Addendum)
Please contact us at [phone] if you have any questions or concerns. 

## 2012-12-08 NOTE — Progress Notes (Signed)
ID: Priscilla Butler   DOB: [birth year]  MR#: [id number]  JXB#:[id number]  PCP: Levon Hedger, MD GYN:  SUWilmon Arms. Corliss Skains, MD OTHER MD:  Maryln Gottron, MD   HISTORY OF PRESENT ILLNESS: From Dr. Renelda Loma 04/17/2008 note: "Ms. Nishida has been in good health.  Her husband actually detected a mass in the right breast and she was subsequently seen and sent for a mammogram at [facility].  A mammogram performed at that time showed potential abnormality in the right breast.  Further views were recommended.  Diagnostic right mammogram and right breast ultrasound performed 02/06/08 showed a 3.3 x 1.7 x 2.8 cm mass confirmed by ultrasound.  This measured 1.2 x 1 x 1. 3 cm.  A BSGI scan performed on 02/13/08 showed centrally located intense activity measuring about 9 mm.  Biopsy 02/20/08 showed intermediate grade ductal cancer, lymphovascular invasion was not identified.  This was ER and PR positive at 100% and 52% respectively, proliferative index was 20%, HER-2 was 2+, negative by FISH.  A preoperative MRI scan showed the abnormality in the right breast.  No other abnormalities seen in the left breast.  The mass measured 1.7 cm.  The patient underwent a lumpectomy and sentinel lymph node evaluation 03/11/08.  Final pathology showed a 1.8 cm grade 1 of 3 invasive ductal cancer.  Surgical margins were clear.  Three sentinel lymph nodes were negative for tumor.  Patient has had an unremarkable postoperative course.  She has been seen by Dr. Kathrynn Running and in fact had completed a course of radiation via MammoSite 03/23/08.  This was unremarkable with no real complications."  Her subsequent history is detailed below.  INTERVAL HISTORY: Dr. Darnelle Catalan and I saw Priscilla Butler for follow up of well-differentiated invasive ductal carcinoma of the right breast. Since her last office visit on 06/15/2012 with Dr. Donnie Coffin, the patient had a basal cell carcinoma removed from her right upper lip one week ago. She is establishing  herself with Dr. Darrall Dears service today.    REVIEW OF SYSTEMS: A 10 point review of systems was completed and is negative except for occasional hot flashes and left foot pain that is followed by an orthopedist.   PAST MEDICAL HISTORY: Past Medical History  Diagnosis Date  . Hyperglycemia   . Menopause   . Osteopenia   . DeQuervain's disease (tenosynovitis)   . Breast cancer 01/2008    Stage I, right breast  . Hyperlipidemia     PAST SURGICAL HISTORY: Past Surgical History  Procedure Date  . [place] tooth extraction 1972  . Breast lumpectomy 03/2008    Right breast  . Skin surgery     facial  skin cancer    FAMILY HISTORY Family History  Problem Relation Age of Onset  . Breast cancer Maternal Grandmother   . Cancer Maternal Grandfather     lung    GYNECOLOGIC HISTORY: Gravida 1, para 1. Menarche age 52,. Age 36, menopause age 75.  She used birth control pills for 2 years.  SOCIAL HISTORY: Priscilla Butler is a 62 year old somewhat for a woman who is married and resides with her husband. She is originally from [place]. She is retired and enjoys spending her time exercising and traveling.  She has one adult son who is a Teacher, early years/pre.   ADVANCED DIRECTIVES: Not on file.  HEALTH MAINTENANCE: History  Substance Use Topics  . Smoking status: Former Smoker    Quit date: 11/02/1999  . Smokeless tobacco: Never Used  . Alcohol Use: 1.8  oz/week    3 Cans of beer per week    Colonoscopy:  2004 - she is due for another colonoscopy this year. PAP:  2012 Bone density: 08/06/11  T-score -1.6  (osteopenia) Lipid panel: 12/04/2012  No Known Allergies  Current Outpatient Prescriptions  Medication Sig Dispense Refill  . aspirin 81 MG tablet Take 81 mg by mouth daily.        . calcium-vitamin D (OSCAL WITH D) 500-200 MG-UNIT per tablet Take 1 tablet by mouth daily.        . Cholecalciferol (VITAMIN D3) 1000 UNITS CAPS Take 1 capsule by mouth daily.        Marland Kitchen glucose blood test  strip Use as instructed  100 each  12  . letrozole (FEMARA) 2.5 MG tablet TAKE 1 TABLET (2.5 MG TOTAL) BY MOUTH DAILY.  30 tablet  5  . Multiple Vitamin (MULTIVITAMIN) tablet Take 1 tablet by mouth daily.        . Omega-3 Fatty Acids (FISH OIL) 1000 MG CAPS Take 1 capsule by mouth 2 (two) times daily.        . simvastatin (ZOCOR) 40 MG tablet TAKE 1 TABLET BY MOUTH ONCE DAILY  90 tablet  3    OBJECTIVE: Filed Vitals:   12/08/12 1126  BP: 130/86  Pulse: 86  Temp: 98.4 F (36.9 C)  Resp: 20     Body mass index is 30.36 kg/(m^2).      ECOG FS:  Grade 0 - Fully active  General appearance: Alert, cooperative, well nourished, no apparent distress Head: Normocephalic, without obvious abnormality, atraumatic, hyperpigmentation on lips Eyes: Conjunctivae/corneas clear, PERRLA, EOMI Nose: Nares, septum and mucosa are normal, no drainage or sinus tenderness Neck: No adenopathy, supple, symmetrical, trachea midline, thyroid not enlarged, no tenderness Resp: Clear to auscultation bilaterally Cardio: Regular rate and rhythm, S1, S2 normal, no murmur, click, rub or gallop Breasts: Soft bilaterally, well-healed surgical scar on right breast, firmness by scar area, no lymphadenopathy, no nipple inversion, no axilla fullness, benign breast exam GI: Soft, distended, non-tender, hypoactive bowel sounds, no organomegaly Extremities: Extremities normal, atraumatic, no cyanosis or edema Lymph nodes: Cervical, supraclavicular, and axillary nodes normal Neurologic: Grossly normal    LAB RESULTS: Lab Results  Component Value Date   WBC 5.9 12/04/2012   NEUTROABS 2.8 12/04/2012   HGB 14.6 12/04/2012   HCT 42.6 12/04/2012   MCV 84.5 12/04/2012   PLT 285 12/04/2012      Chemistry      Component Value Date/Time   NA 139 12/04/2012 0854   K 4.3 12/04/2012 0854   CL 102 12/04/2012 0854   CO2 28 12/04/2012 0854   BUN 19 12/04/2012 0854   CREATININE 0.79 12/04/2012 0854   CREATININE 0.88 06/07/2012 1225      Component  Value Date/Time   CALCIUM 9.9 12/04/2012 0854   ALKPHOS 75 12/04/2012 0854   AST 23 12/04/2012 0854   ALT 25 12/04/2012 0854   BILITOT 0.8 12/04/2012 0854       Lab Results  Component Value Date   LABCA2 26 11/26/2011    Urinalysis    Component Value Date/Time   LABSPEC 1.010 08/06/2011 1428   PHURINE 5.5 08/06/2011 1428   GLUCOSEU NEGATIVE 08/06/2011 1428   HGBUR MODERATE* 08/06/2011 1428   BILIRUBINUR neg 12/04/2012 1150   BILIRUBINUR NEGATIVE 08/06/2011 1428   KETONESUR NEGATIVE 08/06/2011 1428   PROTEINUR NEGATIVE 08/06/2011 1428   UROBILINOGEN negative 12/04/2012 1150   UROBILINOGEN 0.2 08/06/2011  1428   NITRITE neg 12/04/2012 1150   NITRITE POSITIVE* 08/06/2011 1428   LEUKOCYTESUR Trace 12/04/2012 1150    STUDIES: Dg Skull Complete 12/04/2012  *RADIOLOGY REPORT*  Clinical Data: Patient feels a dent in the left side of her skull  SKULL - COMPLETE 4 + VIEW  Comparison: None  Findings: Osseous mineralization grossly normal. Visualized paranasal sinuses and mastoid air cells clear. No calvarial fracture or bone destruction identified. Sutures unremarkable. Minimal hyperostosis frontalis interna. Sella turcica grossly normal appearance.  IMPRESSION: Unremarkable skull.   Original Report Authenticated By: Ulyses Southward, M.D.     Dg Foot Complete Left 12/04/2012  *RADIOLOGY REPORT*  Clinical Data: Lateral foot pain, injury 8 months ago  LEFT FOOT - COMPLETE 3+ VIEW  Comparison: None  Findings: Osseous demineralization. Mild lucency and articular irregularity at the lateral aspect, head of proximal phalanx left fifth toe, could represent sequela of a remote injury. No additional fracture, dislocation or bone destruction. Mild calcaneal spurring at Achilles tendon insertion.  IMPRESSION: Irregularity and lucency at the lateral aspect of the head of the proximal phalanx of the left 5th toe, question sequela of prior injury. No additional focal bony abnormalities identified.   Original Report Authenticated By: Ulyses Southward, M.D.     ASSESSMENT: 62 y.o. Priscilla Butler woman:  1.  Status post lumpectomy with right axillary sentinel node biopsy on 03/11/2008 for a T1c N0 well-differentiated invasive ductal carcinoma of the right breast, grade 1, ER 100%, PR 52%, Ki-67 20%, HER-2 neu 2+ equivocal, FISH showed no amplification, 1.68 ratio.  2. She received high-dose brachytherapy with MammoSite completed on 03/23/2008.  3. Oncotype DX score of 16 with average rate of recurrence of 10%.  4. She started therapy with Femara in 05/2008.  5. Last mammogram was 03/21/2012 at [facility] which showed scattered parenchymal densities are present. The breast parenchymal pattern is stable with no new or worrisome findings in either breast. Surgical changes are noted on the right. Benign findings.  PLAN: Mrs. Rashia Mckesson is doing well from a breast cancer point of view. We plan to see her again in 4 months at which time we will check a CBC and a CMP. A digital diagnostic bilateral mammogram has been ordered for 03/2013. The patient was also given a prescription for Femara #90 with 1 refill as she is approaching 5 years of therapy in 05/2013.  All questions were answered.  The patient was encouraged to contact us in the interim with any problems, questions or concerns.    Larina Bras, NP-C 12/08/2012, 11:27 AM

## 2012-12-14 ENCOUNTER — Ambulatory Visit: Payer: BC Managed Care – PPO | Admitting: Oncology

## 2012-12-29 ENCOUNTER — Ambulatory Visit (INDEPENDENT_AMBULATORY_CARE_PROVIDER_SITE_OTHER): Payer: BC Managed Care – PPO | Admitting: Internal Medicine

## 2012-12-29 DIAGNOSIS — Z789 Other specified health status: Secondary | ICD-10-CM

## 2012-12-29 DIAGNOSIS — Z0000 Encounter for general adult medical examination without abnormal findings: Secondary | ICD-10-CM

## 2012-12-29 MED ORDER — TYPHOID VACCINE PO CPDR: 1.0000 | DELAYED_RELEASE_CAPSULE | ORAL | Status: DC

## 2012-12-29 MED ORDER — AZITHROMYCIN 500 MG PO TABS: 500.0000 mg | ORAL_TABLET | Freq: Every day | ORAL | Status: DC

## 2012-12-29 NOTE — Progress Notes (Signed)
[facility] TRAVEL [facility]IC NOTE  RFV: going to [place] for 10 day trip including 5 day [place] hike Subjective:    Patient ID: Priscilla Butler, female    DOB: [birth year], 62 y.o.   MRN: [id number]  HPI  Going to [place] for 10 day trip, leaving on April 22nd, with her husband going on 5 day trek to [place]. She is originally from [place] No difficulty with altitude. Their itinerary suggests 3 day of resting near [place]  Pmhx: breast ca 2009 Previous travel hx: [place], [place], [place], [place], [place], [place] via cruise  Previous vax: flu in 2013, pneumococcal 2/14, tdap 12/12  Meds: letrozole, simvastatin, mvi, cal-vit d, asa, fish oil   All: NKMA Review of Systems     Objective:   Physical Exam        Assessment & Plan:   pre travel vaccination = will offer hep A #1, and also give rx for oral typhoid vac  Traveler's diarrhea = gave rx for azithromycin and tips sheet  Malaria proph = not needed for this itinerary; gave precautions and recs for insect repellant

## 2013-03-29 ENCOUNTER — Ambulatory Visit (INDEPENDENT_AMBULATORY_CARE_PROVIDER_SITE_OTHER): Payer: BC Managed Care – PPO | Admitting: Internal Medicine

## 2013-03-29 ENCOUNTER — Encounter: Payer: Self-pay | Admitting: Internal Medicine

## 2013-03-29 VITALS — BP 124/75 | HR 90 | Temp 98.5°F | Resp 16 | Wt 164.0 lb

## 2013-03-29 DIAGNOSIS — H6120 Impacted cerumen, unspecified ear: Secondary | ICD-10-CM

## 2013-03-29 DIAGNOSIS — J309 Allergic rhinitis, unspecified: Secondary | ICD-10-CM

## 2013-03-29 DIAGNOSIS — R05 Cough: Secondary | ICD-10-CM

## 2013-03-29 DIAGNOSIS — J329 Chronic sinusitis, unspecified: Secondary | ICD-10-CM

## 2013-03-29 DIAGNOSIS — H6122 Impacted cerumen, left ear: Secondary | ICD-10-CM

## 2013-03-29 MED ORDER — AZITHROMYCIN 250 MG PO TABS: ORAL_TABLET | ORAL | Status: DC

## 2013-03-29 MED ORDER — METHYLPREDNISOLONE ACETATE 80 MG/ML IJ SUSP: 120.0000 mg | Freq: Once | INTRAMUSCULAR | Status: DC

## 2013-03-29 NOTE — Patient Instructions (Addendum)
See me in 10 days  

## 2013-03-29 NOTE — Progress Notes (Signed)
Subjective:    Patient ID: Priscilla Butler, female    DOB: 21-Mar-1951, 62 y.o.   MRN: [id number]  HPI  [name] is here for acute visit.  She has returned 3 weeks ago from a hiking trip to [place].  She report she has had a dry cough and sore throat since return.  Her throat has improved but is having fullness and discomfort in her L ear  She has been taking a codeine cough syrup but cough persists  No fever,  Nonproductive cough,   No chest pain No SOb  No Known Allergies Past Medical History  Diagnosis Date  . Hyperglycemia   . Menopause   . Osteopenia   . DeQuervain's disease (tenosynovitis)   . Breast cancer 01/2008    Stage I, right breast  . Hyperlipidemia    Past Surgical History  Procedure Laterality Date  . Wisdom tooth extraction  1972  . Breast lumpectomy  03/2008    Right breast  . Skin surgery      facial  skin cancer   History   Social History  . Marital Status: Married    Spouse Name: Claris Gladden    Number of Children: 1  . Years of Education: BA   Occupational History  . Retired    Social History Main Topics  . Smoking status: Former Smoker    Quit date: 11/02/1999  . Smokeless tobacco: Never Used  . Alcohol Use: 1.8 oz/week    3 Cans of beer per week  . Drug Use: No  . Sexually Active: Yes -- Female partner(s)    Birth Control/ Protection: Post-menopausal   Other Topics Concern  . Not on file   Social History Narrative  . No narrative on file   Family History  Problem Relation Age of Onset  . Breast cancer Maternal Grandmother   . Cancer Maternal Grandfather     lung  . Hypertension Mother   . Hyperlipidemia Mother    Patient Active Problem List   Diagnosis Date Noted  . Skin cancer, basal cell 12/04/2012  . Hyperlipidemia   . Hyperglycemia   . DeQuervain's disease (tenosynovitis)   . Menopause   . Osteopenia   . HX: breast cancer 05/26/2011  . Breast cancer 01/31/2008   Current Outpatient Prescriptions on File Prior to Visit  Medication  Sig Dispense Refill  . aspirin 81 MG tablet Take 81 mg by mouth daily.        . calcium-vitamin D (OSCAL WITH D) 500-200 MG-UNIT per tablet Take 1 tablet by mouth daily.        . Cholecalciferol (VITAMIN D3) 1000 UNITS CAPS Take 1 capsule by mouth daily.        Marland Kitchen glucose blood test strip Use as instructed  100 each  12  . letrozole (FEMARA) 2.5 MG tablet Take 1 tablet (2.5 mg total) by mouth daily.  90 tablet  1  . Multiple Vitamin (MULTIVITAMIN) tablet Take 1 tablet by mouth daily.        . Omega-3 Fatty Acids (FISH OIL) 1000 MG CAPS Take 1 capsule by mouth 2 (two) times daily.        . simvastatin (ZOCOR) 40 MG tablet TAKE 1 TABLET BY MOUTH ONCE DAILY  90 tablet  3   No current facility-administered medications on file prior to visit.      Review of Systems See HPI    Objective:   Physical Exam Physical Exam  Nursing note and vitals reviewed.  Constitutional: She is oriented to person, place, and time. She appears well-developed and well-nourished.  HENT: Tm's  L cerumen impaction,  R serous effusion O/P  Non injected no exudates No cervical adenopathy Head: Normocephalic and atraumatic.  Cardiovascular: Normal rate and regular rhythm. Exam reveals no gallop and no friction rub.  No murmur heard.  Pulmonary/Chest: Breath sounds normal. She has no wheezes. She has no rales.  Neurological: She is alert and oriented to person, place, and time.  Skin: Skin is warm and dry.  Psychiatric: She has a normal mood and affect. Her behavior is normal.             Assessment & Plan:  Sinsusitis  Will give Z-pak  Cough May be allergy related.  Will give Depomedrol 120 mg Im in office.   OK to continue codeiene cough syrup  L cerumen impaction:   Will irrigate in office today  Allergic  Rhinitis:  OTC Zyrtec D  See me in 10 days  See me if not better

## 2013-03-29 NOTE — Addendum Note (Signed)
Addended by: Mathews Robinsons on: 03/29/2013 03:53 PM   Modules accepted: Orders

## 2013-04-09 ENCOUNTER — Ambulatory Visit: Payer: BC Managed Care – PPO | Admitting: Internal Medicine

## 2013-04-10 ENCOUNTER — Ambulatory Visit (HOSPITAL_BASED_OUTPATIENT_CLINIC_OR_DEPARTMENT_OTHER): Payer: BC Managed Care – PPO | Admitting: Oncology

## 2013-04-10 ENCOUNTER — Other Ambulatory Visit (HOSPITAL_BASED_OUTPATIENT_CLINIC_OR_DEPARTMENT_OTHER): Payer: BC Managed Care – PPO | Admitting: Lab

## 2013-04-10 VITALS — BP 132/87 | HR 87 | Temp 98.4°F | Resp 20 | Ht 62.0 in | Wt 163.0 lb

## 2013-04-10 DIAGNOSIS — Z853 Personal history of malignant neoplasm of breast: Secondary | ICD-10-CM

## 2013-04-10 DIAGNOSIS — C50919 Malignant neoplasm of unspecified site of unspecified female breast: Secondary | ICD-10-CM

## 2013-04-10 LAB — CBC WITH DIFFERENTIAL/PLATELET
BASO%: 0.7 % (ref 0.0–2.0)
EOS%: 3 % (ref 0.0–7.0)
MCH: 29.4 pg (ref 25.1–34.0)
MCHC: 34 g/dL (ref 31.5–36.0)
NEUT%: 54.7 % (ref 38.4–76.8)
RBC: 4.52 10*6/uL (ref 3.70–5.45)
RDW: 13.4 % (ref 11.2–14.5)
lymph#: 2.6 10*3/uL (ref 0.9–3.3)

## 2013-04-10 LAB — COMPREHENSIVE METABOLIC PANEL (CC13)
ALT: 21 U/L (ref 0–55)
AST: 16 U/L (ref 5–34)
Calcium: 9.4 mg/dL (ref 8.4–10.4)
Chloride: 105 mEq/L (ref 98–107)
Creatinine: 0.9 mg/dL (ref 0.6–1.1)
Potassium: 3.7 mEq/L (ref 3.5–5.1)
Sodium: 141 mEq/L (ref 136–145)

## 2013-04-10 NOTE — Progress Notes (Signed)
ID: Priscilla Butler   DOB: 1951-06-18  MR#: [id number]  WUJ#:[id number]  PCP: Levon Hedger, MD GYN:  SUWilmon Arms. Corliss Skains, MD OTHER MD:  Maryln Gottron, MD   HISTORY OF PRESENT ILLNESS: From Dr. Renelda Loma 04/17/2008 note: "Priscilla Butler has been in good health.  Her husband actually detected a mass in the right breast and she was subsequently seen and sent for a mammogram at [facility].  A mammogram performed at that time showed potential abnormality in the right breast.  Further views were recommended.  Diagnostic right mammogram and right breast ultrasound performed 02/06/08 showed a 3.3 x 1.7 x 2.8 cm mass confirmed by ultrasound.  This measured 1.2 x 1 x 1. 3 cm.  A BSGI scan performed on 02/13/08 showed centrally located intense activity measuring about 9 mm.  Biopsy 02/20/08 showed intermediate grade ductal cancer, lymphovascular invasion was not identified.  This was ER and PR positive at 100% and 52% respectively, proliferative index was 20%, HER-2 was 2+, negative by FISH.  A preoperative MRI scan showed the abnormality in the right breast.  No other abnormalities seen in the left breast.  The mass measured 1.7 cm.  The patient underwent a lumpectomy and sentinel lymph node evaluation 03/11/08.  Final pathology showed a 1.8 cm grade 1 of 3 invasive ductal cancer.  Surgical margins were clear.  Three sentinel lymph nodes were negative for tumor.  Patient has had an unremarkable postoperative course.  She has been seen by Dr. Kathrynn Running and in fact had completed a course of radiation via MammoSite 03/23/08.  This was unremarkable with no real complications."  Her subsequent history is detailed below.  INTERVAL HISTORY: Dr. Darnelle Catalan and I saw Priscilla Butler for follow up of well-differentiated invasive ductal carcinoma of the right breast. Since her last office visit on 06/15/2012 with Dr. Donnie Coffin, the patient had a basal cell carcinoma removed from her right upper lip one week ago. She is establishing  herself with Dr. Darrall Dears service today.    REVIEW OF SYSTEMS: A 10 point review of systems was completed and is negative except for occasional hot flashes and left foot pain that is followed by an orthopedist.   PAST MEDICAL HISTORY: Past Medical History  Diagnosis Date  . Hyperglycemia   . Menopause   . Osteopenia   . DeQuervain's disease (tenosynovitis)   . Breast cancer 01/2008    Stage I, right breast  . Hyperlipidemia     PAST SURGICAL HISTORY: Past Surgical History  Procedure Laterality Date  . Wisdom tooth extraction  1972  . Breast lumpectomy  03/2008    Right breast  . Skin surgery      facial  skin cancer    FAMILY HISTORY Family History  Problem Relation Age of Onset  . Breast cancer Maternal Grandmother   . Cancer Maternal Grandfather     lung  . Hypertension Mother   . Hyperlipidemia Mother     GYNECOLOGIC HISTORY: Gravida 1, para 1. Menarche age 20,. Age 37, menopause age 89.  She used birth control pills for 2 years.  SOCIAL HISTORY: Priscilla Butler is a 62 year old  woman who is married and resides with her husband. She is originally from [place]. She is retired and enjoys spending her time exercising and traveling.  She has one adult son who is a Teacher, early years/pre.   ADVANCED DIRECTIVES: Not on file.  HEALTH MAINTENANCE: History  Substance Use Topics  . Smoking status: Former Smoker    Quit date:  11/02/1999  . Smokeless tobacco: Never Used  . Alcohol Use: 1.8 oz/week    3 Cans of beer per week    Colonoscopy:  2004 - she is due for another colonoscopy this year. PAP:  2012 Bone density: 08/06/11  T-score -1.6  (osteopenia) Lipid panel: 12/04/2012  No Known Allergies  Current Outpatient Prescriptions  Medication Sig Dispense Refill  . aspirin 81 MG tablet Take 81 mg by mouth daily.        Marland Kitchen azithromycin (ZITHROMAX) 250 MG tablet Take as directed  6 tablet  0  . calcium-vitamin D (OSCAL WITH D) 500-200 MG-UNIT per tablet Take 1 tablet by mouth  daily.        . Cholecalciferol (VITAMIN D3) 1000 UNITS CAPS Take 1 capsule by mouth daily.        Marland Kitchen glucose blood test strip Use as instructed  100 each  12  . letrozole (FEMARA) 2.5 MG tablet Take 1 tablet (2.5 mg total) by mouth daily.  90 tablet  1  . Multiple Vitamin (MULTIVITAMIN) tablet Take 1 tablet by mouth daily.        . Omega-3 Fatty Acids (FISH OIL) 1000 MG CAPS Take 1 capsule by mouth 2 (two) times daily.        . simvastatin (ZOCOR) 40 MG tablet TAKE 1 TABLET BY MOUTH ONCE DAILY  90 tablet  3   No current facility-administered medications for this visit.    OBJECTIVE: Filed Vitals:   04/10/13 1554  BP: 132/87  Pulse: 87  Temp: 98.4 F (36.9 C)  Resp: 20     Body mass index is 29.81 kg/(m^2).      ECOG FS:  Grade 0 - Fully active  General appearance: Alert, cooperative, well nourished, no apparent distress Head: Normocephalic, without obvious abnormality, atraumatic, hyperpigmentation on lips Eyes: Conjunctivae/corneas clear, PERRLA, EOMI Nose: Nares, septum and mucosa are normal, no drainage or sinus tenderness Neck: No adenopathy, supple, symmetrical, trachea midline, thyroid not enlarged, no tenderness Resp: Clear to auscultation bilaterally Cardio: Regular rate and rhythm, S1, S2 normal, no murmur, click, rub or gallop Breasts: Soft bilaterally, well-healed surgical scar on right breast, firmness by scar area, no lymphadenopathy, no nipple inversion, no axilla fullness, benign breast exam GI: Soft, distended, non-tender, hypoactive bowel sounds, no organomegaly Extremities: Extremities normal, atraumatic, no cyanosis or edema Lymph nodes: Cervical, supraclavicular, and axillary nodes normal Neurologic: Grossly normal    LAB RESULTS: Lab Results  Component Value Date   WBC 8.1 04/10/2013   NEUTROABS 4.4 04/10/2013   HGB 13.3 04/10/2013   HCT 39.1 04/10/2013   MCV 86.5 04/10/2013   PLT 252 04/10/2013      Chemistry      Component Value Date/Time   NA 141  04/10/2013 1529   NA 139 12/04/2012 0854   K 3.7 04/10/2013 1529   K 4.3 12/04/2012 0854   CL 105 04/10/2013 1529   CL 102 12/04/2012 0854   CO2 28 04/10/2013 1529   CO2 28 12/04/2012 0854   BUN 25.7 04/10/2013 1529   BUN 19 12/04/2012 0854   CREATININE 0.9 04/10/2013 1529   CREATININE 0.79 12/04/2012 0854   CREATININE 0.88 06/07/2012 1225      Component Value Date/Time   CALCIUM 9.4 04/10/2013 1529   CALCIUM 9.9 12/04/2012 0854   ALKPHOS 79 04/10/2013 1529   ALKPHOS 75 12/04/2012 0854   AST 16 04/10/2013 1529   AST 23 12/04/2012 0854   ALT 21 04/10/2013 1529  ALT 25 12/04/2012 0854   BILITOT 0.81 04/10/2013 1529   BILITOT 0.8 12/04/2012 0854       Lab Results  Component Value Date   LABCA2 26 11/26/2011    Urinalysis    Component Value Date/Time   LABSPEC 1.010 08/06/2011 1428   PHURINE 5.5 08/06/2011 1428   GLUCOSEU NEGATIVE 08/06/2011 1428   HGBUR MODERATE* 08/06/2011 1428   BILIRUBINUR neg 12/04/2012 1150   BILIRUBINUR NEGATIVE 08/06/2011 1428   KETONESUR NEGATIVE 08/06/2011 1428   PROTEINUR NEGATIVE 08/06/2011 1428   UROBILINOGEN negative 12/04/2012 1150   UROBILINOGEN 0.2 08/06/2011 1428   NITRITE neg 12/04/2012 1150   NITRITE POSITIVE* 08/06/2011 1428   LEUKOCYTESUR Trace 12/04/2012 1150    STUDIES: Last mammogram was 03/21/2012 at [facility] which showed scattered parenchymal densities  present. The breast parenchymal pattern is stable with no new or worrisome findings in either breast. Surgical changes are noted on the right. Benign findings.   Dg Skull Complete 12/04/2012  *RADIOLOGY REPORT*  Clinical Data: Patient feels a dent in the left side of her skull  SKULL - COMPLETE 4 + VIEW  Comparison: None  Findings: Osseous mineralization grossly normal. Visualized paranasal sinuses and mastoid air cells clear. No calvarial fracture or bone destruction identified. Sutures unremarkable. Minimal hyperostosis frontalis interna. Sella turcica grossly normal appearance.  IMPRESSION: Unremarkable skull.    Original Report Authenticated By: Ulyses Southward, M.D.     Dg Foot Complete Left 12/04/2012  *RADIOLOGY REPORT*  Clinical Data: Lateral foot pain, injury 8 months ago  LEFT FOOT - COMPLETE 3+ VIEW  Comparison: None  Findings: Osseous demineralization. Mild lucency and articular irregularity at the lateral aspect, head of proximal phalanx left fifth toe, could represent sequela of a remote injury. No additional fracture, dislocation or bone destruction. Mild calcaneal spurring at Achilles tendon insertion.  IMPRESSION: Irregularity and lucency at the lateral aspect of the head of the proximal phalanx of the left 5th toe, question sequela of prior injury. No additional focal bony abnormalities identified.   Original Report Authenticated By: Ulyses Southward, M.D.     ASSESSMENT: 62 y.o. [place] woman:  1.  Status post right lumpectomy with right axillary sentinel node biopsy on 03/11/2008 for a T1c N0 well-differentiated invasive ductal carcinoma of the right breast, grade 1, ER 100%, PR 52%, Ki-67 20%, HER-2 neu 2+ equivocal, FISH showed no amplification, 1.68 ratio.  2. She received high-dose brachytherapy with MammoSite completed on 03/23/2008.  3. Oncotype DX score of 16 with average rate of recurrence of 10%.  4. She receeived letrozole/ Femara from July 2009 to July 2014.   PLAN: Dr. Darnelle Catalan went over the patient's history in detail and addressed or questions, as noted below.  Larina Bras, NP-C 04/10/2013, 4:11 PM   ADDENDUM: I gave Ms. Butler a written account of her breast cancer history and discuss the fact that we do not have data for continuing aromatase inhibitors beyond 5 years. Perhaps it would be helpful to continue about perhaps it would be harmful and perhaps it would make no difference. I am practice in this circumstance is to discontinue the therapy at the five-year mark and I generally release the patient back to their primary care physician at that time as well.  Ms.  Butler is very comfortable with this plan. She understands that we will keep her records here an additional 10 years and that during that time for any  questions she has or any problem that requires a visit we  will be available. As of now ever no further appointments have been made for her. A letter will be sent to her primary care physician discussing appropriate followup.  I personally saw this patient and performed a substantive portion of this encounter with the listed APP documented above.   Lowella Dell, MD

## 2013-04-12 ENCOUNTER — Telehealth: Payer: Self-pay | Admitting: Oncology

## 2013-04-12 NOTE — Telephone Encounter (Signed)
Letter sent to Dr. Constance Goltz office from Dr. Darnelle Catalan

## 2013-05-01 ENCOUNTER — Other Ambulatory Visit: Payer: Self-pay | Admitting: Internal Medicine

## 2013-05-02 NOTE — Telephone Encounter (Signed)
Refill request

## 2013-05-03 ENCOUNTER — Telehealth: Payer: Self-pay | Admitting: Internal Medicine

## 2013-05-03 NOTE — Telephone Encounter (Signed)
Priscilla Butler  Call pt and tell her that I noted her glucose was a little high when she saw Dr. Darnelle Catalan in June.    Have her come in fasting and get a fasting BMP and a Hgb A1C

## 2013-05-03 NOTE — Telephone Encounter (Signed)
LVM for pt to return call

## 2013-05-15 ENCOUNTER — Telehealth: Payer: Self-pay | Admitting: *Deleted

## 2013-05-15 NOTE — Telephone Encounter (Signed)
Notified pt of need for fasting labs will come in this week for labs

## 2013-05-23 ENCOUNTER — Other Ambulatory Visit: Payer: Self-pay | Admitting: *Deleted

## 2013-05-23 DIAGNOSIS — R739 Hyperglycemia, unspecified: Secondary | ICD-10-CM

## 2013-05-23 LAB — BASIC METABOLIC PANEL
CO2: 27 mEq/L (ref 19–32)
Calcium: 9.4 mg/dL (ref 8.4–10.5)
Creat: 0.73 mg/dL (ref 0.50–1.10)
Glucose, Bld: 110 mg/dL — ABNORMAL HIGH (ref 70–99)

## 2013-05-24 ENCOUNTER — Telehealth: Payer: Self-pay | Admitting: *Deleted

## 2013-05-24 NOTE — Telephone Encounter (Signed)
Message copied by Mathews Robinsons on Thu May 24, 2013  1:11 PM ------      Message from: Raechel Chute D      Created: Thu May 24, 2013 11:33 AM       Karen Kitchens            Call pt and let her know that her glucose is better but she is on the borderline of diabetes.  She really needs to watch the sweets and sugar in her diet.  ASk her if she want a nutrition referral to help with diet            Message back with response ------

## 2013-05-24 NOTE — Telephone Encounter (Signed)
Notified pt of lab results she does not wish to have  Nutrition referral at this time

## 2013-07-25 ENCOUNTER — Encounter: Payer: Self-pay | Admitting: Internal Medicine

## 2013-07-25 ENCOUNTER — Ambulatory Visit (INDEPENDENT_AMBULATORY_CARE_PROVIDER_SITE_OTHER): Payer: BC Managed Care – PPO | Admitting: Internal Medicine

## 2013-07-25 VITALS — BP 136/80 | HR 88 | Temp 97.2°F | Resp 18 | Wt 163.0 lb

## 2013-07-25 DIAGNOSIS — R739 Hyperglycemia, unspecified: Secondary | ICD-10-CM

## 2013-07-25 DIAGNOSIS — H9202 Otalgia, left ear: Secondary | ICD-10-CM

## 2013-07-25 DIAGNOSIS — R7309 Other abnormal glucose: Secondary | ICD-10-CM

## 2013-07-25 DIAGNOSIS — Z23 Encounter for immunization: Secondary | ICD-10-CM

## 2013-07-25 DIAGNOSIS — H9209 Otalgia, unspecified ear: Secondary | ICD-10-CM

## 2013-07-25 NOTE — Patient Instructions (Addendum)
Will set up referral to ENT with Dr. Pollyann Kennedy  Flu vaccine today and second Hepatitis A vaccine today

## 2013-07-25 NOTE — Progress Notes (Signed)
Subjective:    Patient ID: Priscilla Butler, female    DOB: 1951/06/04, 62 y.o.   MRN: [id number]  HPI  Priscilla Butler is here for acute visit.  She has been having drainage from her L ear .  Some discomfort at tragus of Left ear.  No pain during night  No fever   No Known Allergies Past Medical History  Diagnosis Date  . Hyperglycemia   . Menopause   . Osteopenia   . DeQuervain's disease (tenosynovitis)   . Breast cancer 01/2008    Stage I, right breast  . Hyperlipidemia    Past Surgical History  Procedure Laterality Date  . Wisdom tooth extraction  1972  . Breast lumpectomy  03/2008    Right breast  . Skin surgery      facial  skin cancer   History   Social History  . Marital Status: Married    Spouse Name: Claris Gladden    Number of Children: 1  . Years of Education: BA   Occupational History  . Retired    Social History Main Topics  . Smoking status: Former Smoker    Quit date: 11/02/1999  . Smokeless tobacco: Never Used  . Alcohol Use: 1.8 oz/week    3 Cans of beer per week  . Drug Use: No  . Sexual Activity: Yes    Partners: Male    Birth Control/ Protection: Post-menopausal   Other Topics Concern  . Not on file   Social History Narrative  . No narrative on file   Family History  Problem Relation Age of Onset  . Breast cancer Maternal Grandmother   . Cancer Maternal Grandfather     lung  . Hypertension Mother   . Hyperlipidemia Mother    Patient Active Problem List   Diagnosis Date Noted  . Skin cancer, basal cell 12/04/2012  . Hyperlipidemia   . Hyperglycemia   . DeQuervain's disease (tenosynovitis)   . Menopause   . Osteopenia   . HX: breast cancer 05/26/2011  . Breast cancer 01/31/2008   Current Outpatient Prescriptions on File Prior to Visit  Medication Sig Dispense Refill  . aspirin 81 MG tablet Take 81 mg by mouth daily.        . calcium-vitamin D (OSCAL WITH D) 500-200 MG-UNIT per tablet Take 1 tablet by mouth daily.        . Cholecalciferol (VITAMIN  D3) 1000 UNITS CAPS Take 1 capsule by mouth daily.        . Multiple Vitamin (MULTIVITAMIN) tablet Take 1 tablet by mouth daily.        . Omega-3 Fatty Acids (FISH OIL) 1000 MG CAPS Take 1 capsule by mouth 2 (two) times daily.        . simvastatin (ZOCOR) 40 MG tablet TAKE 1 TABLET BY MOUTH ONCE DAILY  90 tablet  3  . glucose blood test strip Use as instructed  100 each  12   No current facility-administered medications on file prior to visit.      Review of Systems    see HPI Objective:   Physical Exam Physical Exam  Constitutional: She is oriented to person, place, and time. She appears well-developed and well-nourished. She is cooperative.  HENT:  Head: Normocephalic and atraumatic.  Right Ear: A middle ear effusion is present.  Left Ear:  Unable  To visualize drum due to cerumen.  No mastoid pain Nose: Mucosal edema present. Right sinus exhibits maxillary sinus tenderness. Left sinus exhibits maxillary sinus  tenderness.  Mouth/Throat: Posterior oropharyngeal erythema present.  Serous effusion bilaterally  Eyes: Conjunctivae and EOM are normal. Pupils are equal, round, and reactive to light.  Neck: Neck supple. Carotid bruit is not present. No mass present.  Cardiovascular: Regular rhythm, normal heart sounds, intact distal pulses and normal pulses. Exam reveals no gallop and no friction rub.  No murmur heard.  Pulmonary/Chest: Breath sounds normal. She has no wheezes. She has no rhonchi. She has no rales.  Neurological: She is alert and oriented to person, place, and time.  Skin: Skin is warm and dry. No abrasion, no bruising, no ecchymosis and no rash noted. No cyanosis. Nails show no clubbing.  Psychiatric: She has a normal mood and affect. Her speech is normal and behavior is normal.             Assessment & Plan:  L  Ear drainage   Since I cannot see drum  I will have pt evaluated by ENT who can visuallize better  Will give influenza and second Hepatitis A today

## 2013-07-30 ENCOUNTER — Telehealth: Payer: Self-pay | Admitting: *Deleted

## 2013-07-30 DIAGNOSIS — M8580 Other specified disorders of bone density and structure, unspecified site: Secondary | ICD-10-CM

## 2013-07-30 NOTE — Telephone Encounter (Signed)
[facility] needs an order faxed for Priscilla Butler  to [phone] for a dexa scan. Her last dexa was 08/06/11, and it was recommended she have follow up in 2 years.

## 2013-08-19 ENCOUNTER — Encounter: Payer: Self-pay | Admitting: Internal Medicine

## 2013-08-19 ENCOUNTER — Telehealth: Payer: Self-pay | Admitting: Internal Medicine

## 2013-08-19 NOTE — Telephone Encounter (Signed)
Priscilla Butler  Call pt and let her know that I have seen her bone density test.  She still has osteopenia  No osteoporosis  Advise her to continue her calcium, vitamin D and exercise

## 2013-08-20 NOTE — Telephone Encounter (Signed)
Notified pt of bone density results  

## 2013-08-24 ENCOUNTER — Encounter: Payer: Self-pay | Admitting: *Deleted

## 2013-12-11 ENCOUNTER — Other Ambulatory Visit: Payer: Self-pay | Admitting: Internal Medicine

## 2014-01-16 ENCOUNTER — Ambulatory Visit (INDEPENDENT_AMBULATORY_CARE_PROVIDER_SITE_OTHER): Payer: 59 | Admitting: Internal Medicine

## 2014-01-16 ENCOUNTER — Encounter: Payer: Self-pay | Admitting: Internal Medicine

## 2014-01-16 VITALS — BP 145/78 | HR 87 | Temp 98.1°F | Resp 18 | Wt 169.0 lb

## 2014-01-16 DIAGNOSIS — R399 Unspecified symptoms and signs involving the genitourinary system: Secondary | ICD-10-CM

## 2014-01-16 DIAGNOSIS — N390 Urinary tract infection, site not specified: Secondary | ICD-10-CM

## 2014-01-16 DIAGNOSIS — R3989 Other symptoms and signs involving the genitourinary system: Secondary | ICD-10-CM

## 2014-01-16 LAB — POCT URINALYSIS DIPSTICK
BILIRUBIN UA: NEGATIVE
GLUCOSE UA: NEGATIVE
KETONES UA: NEGATIVE
NITRITE UA: POSITIVE
Protein, UA: NEGATIVE
SPEC GRAV UA: 1.01
Urobilinogen, UA: NEGATIVE
pH, UA: 6.5

## 2014-01-16 MED ORDER — CIPROFLOXACIN HCL 250 MG PO TABS: 250.0000 mg | ORAL_TABLET | Freq: Two times a day (BID) | ORAL | Status: DC

## 2014-01-16 NOTE — Patient Instructions (Signed)
Call office MOnday

## 2014-01-16 NOTE — Addendum Note (Signed)
Addended by: Conley Rolls on: 01/16/2014 03:02 PM   Modules accepted: Orders

## 2014-01-16 NOTE — Progress Notes (Signed)
Subjective:    Patient ID: Priscilla Butler, female    DOB: [birth year], 63 y.o.   MRN: [id number]  HPI  Several days of malodorous urine and intermittant dysuria  No flank pain no fever, no N/V  No Known Allergies Past Medical History  Diagnosis Date  . Hyperglycemia   . Menopause   . Osteopenia   . DeQuervain's disease (tenosynovitis)   . Breast cancer 01/2008    Stage I, right breast  . Hyperlipidemia    Past Surgical History  Procedure Laterality Date  . Wisdom tooth extraction  1972  . Breast lumpectomy  03/2008    Right breast  . Skin surgery      facial  skin cancer   History   Social History  . Marital Status: Married    Spouse Name: [name]    Number of Children: 1  . Years of Education: BA   Occupational History  . Retired    Social History Main Topics  . Smoking status: Former Smoker    Quit date: 11/02/1999  . Smokeless tobacco: Never Used  . Alcohol Use: 1.8 oz/week    3 Cans of beer per week  . Drug Use: No  . Sexual Activity: Yes    Partners: Male    Birth Control/ Protection: Post-menopausal   Other Topics Concern  . Not on file   Social History Narrative  . No narrative on file   Family History  Problem Relation Age of Onset  . Breast cancer Maternal Grandmother   . Cancer Maternal Grandfather     lung  . Hypertension Mother   . Hyperlipidemia Mother    Patient Active Problem List   Diagnosis Date Noted  . Skin cancer, basal cell 12/04/2012  . Hyperlipidemia   . Hyperglycemia   . DeQuervain's disease (tenosynovitis)   . Menopause   . Osteopenia   . HX: breast cancer 05/26/2011  . Breast cancer 01/31/2008   Current Outpatient Prescriptions on File Prior to Visit  Medication Sig Dispense Refill  . calcium-vitamin D (OSCAL WITH D) 500-200 MG-UNIT per tablet Take 1 tablet by mouth daily.        . Cholecalciferol (VITAMIN D3) 1000 UNITS CAPS Take 1 capsule by mouth daily.        . Multiple Vitamin (MULTIVITAMIN) tablet Take 1 tablet by  mouth daily.        . Omega-3 Fatty Acids (FISH OIL) 1000 MG CAPS Take 1 capsule by mouth 2 (two) times daily.        . simvastatin (ZOCOR) 40 MG tablet TAKE 1 TABLET BY MOUTH ONCE DAILY  90 tablet  3  . aspirin 81 MG tablet Take 81 mg by mouth daily.        . ONE TOUCH ULTRA TEST test strip TEST BLOOD SUGAR TWICE A DAY AS DIRECTED  100 each  12   No current facility-administered medications on file prior to visit.     Review of Systems    see HPI Objective:   Physical Exam Physical Exam  Nursing note and vitals reviewed.  Constitutional: She is oriented to person, place, and time. She appears well-developed and well-nourished.  HENT:  Head: Normocephalic and atraumatic.  Cardiovascular: Normal rate and regular rhythm. Exam reveals no gallop and no friction rub.  No murmur heard.  Pulmonary/Chest: Breath sounds normal. She has no wheezes. She has no rales. Abd:  No CVA tenderness . Pos suprapubic tenderness  Neurological: She is alert and oriented  to person, place, and time.  Skin: Skin is warm and dry.  Psychiatric: She has a normal mood and affect. Her behavior is normal.         Assessment & Plan:  UTI  U/A pos  Le and traace blood  Will give Cipro 250 mg bid 5 days  Send culture

## 2014-01-19 ENCOUNTER — Encounter: Payer: Self-pay | Admitting: Internal Medicine

## 2014-01-19 LAB — URINE CULTURE

## 2014-04-17 ENCOUNTER — Telehealth: Payer: Self-pay

## 2014-04-17 NOTE — Telephone Encounter (Signed)
Order faxed to [facility] for mammogram.  Sent to scan.   

## 2014-05-02 ENCOUNTER — Encounter: Payer: Self-pay | Admitting: *Deleted

## 2014-05-21 ENCOUNTER — Other Ambulatory Visit: Payer: Self-pay | Admitting: Internal Medicine

## 2014-05-21 NOTE — Telephone Encounter (Signed)
Refill request

## 2014-05-21 NOTE — Telephone Encounter (Signed)
Called pt to adv need labs drawn for further refills of simvastatin - pt will make appt

## 2014-06-05 ENCOUNTER — Encounter: Payer: Self-pay | Admitting: Internal Medicine

## 2014-06-05 ENCOUNTER — Ambulatory Visit (INDEPENDENT_AMBULATORY_CARE_PROVIDER_SITE_OTHER): Payer: 59 | Admitting: Internal Medicine

## 2014-06-05 VITALS — BP 121/75 | HR 81 | Temp 97.9°F | Resp 16 | Ht 62.0 in | Wt 158.0 lb

## 2014-06-05 DIAGNOSIS — E785 Hyperlipidemia, unspecified: Secondary | ICD-10-CM

## 2014-06-05 DIAGNOSIS — H6120 Impacted cerumen, unspecified ear: Secondary | ICD-10-CM

## 2014-06-05 DIAGNOSIS — H6122 Impacted cerumen, left ear: Secondary | ICD-10-CM

## 2014-06-05 DIAGNOSIS — R739 Hyperglycemia, unspecified: Secondary | ICD-10-CM

## 2014-06-05 DIAGNOSIS — R7309 Other abnormal glucose: Secondary | ICD-10-CM

## 2014-06-05 LAB — COMPREHENSIVE METABOLIC PANEL
ALT: 33 U/L (ref 0–35)
AST: 26 U/L (ref 0–37)
Albumin: 4.6 g/dL (ref 3.5–5.2)
Alkaline Phosphatase: 66 U/L (ref 39–117)
BUN: 19 mg/dL (ref 6–23)
CO2: 27 mEq/L (ref 19–32)
CREATININE: 0.8 mg/dL (ref 0.50–1.10)
Calcium: 9.4 mg/dL (ref 8.4–10.5)
Chloride: 104 mEq/L (ref 96–112)
GLUCOSE: 124 mg/dL — AB (ref 70–99)
Potassium: 4.5 mEq/L (ref 3.5–5.3)
Sodium: 140 mEq/L (ref 135–145)
Total Bilirubin: 1.3 mg/dL — ABNORMAL HIGH (ref 0.2–1.2)
Total Protein: 7 g/dL (ref 6.0–8.3)

## 2014-06-05 LAB — LIPID PANEL
CHOL/HDL RATIO: 3.4 ratio
CHOLESTEROL: 211 mg/dL — AB (ref 0–200)
HDL: 62 mg/dL (ref >39)
LDL Cholesterol: 129 mg/dL — ABNORMAL HIGH (ref 0–99)
TRIGLYCERIDES: 102 mg/dL (ref <150)
VLDL: 20 mg/dL (ref 0–40)

## 2014-06-05 LAB — CBC WITH DIFFERENTIAL/PLATELET
Basophils Absolute: 0.1 10*3/uL (ref 0.0–0.1)
Basophils Relative: 1 % (ref 0–1)
Eosinophils Absolute: 0.2 10*3/uL (ref 0.0–0.7)
Eosinophils Relative: 3 % (ref 0–5)
HCT: 41.5 % (ref 36.0–46.0)
HEMOGLOBIN: 14.5 g/dL (ref 12.0–15.0)
LYMPHS PCT: 38 % (ref 12–46)
Lymphs Abs: 1.9 10*3/uL (ref 0.7–4.0)
MCH: 29.5 pg (ref 26.0–34.0)
MCHC: 34.9 g/dL (ref 30.0–36.0)
MCV: 84.3 fL (ref 78.0–100.0)
MONOS PCT: 11 % (ref 3–12)
Monocytes Absolute: 0.6 10*3/uL (ref 0.1–1.0)
NEUTROS ABS: 2.4 10*3/uL (ref 1.7–7.7)
Neutrophils Relative %: 47 % (ref 43–77)
Platelets: 277 10*3/uL (ref 150–400)
RBC: 4.92 MIL/uL (ref 3.87–5.11)
RDW: 13.9 % (ref 11.5–15.5)
WBC: 5 10*3/uL (ref 4.0–10.5)

## 2014-06-05 LAB — HEMOGLOBIN A1C
Hgb A1c MFr Bld: 6.4 % — ABNORMAL HIGH (ref <5.7)
Mean Plasma Glucose: 137 mg/dL — ABNORMAL HIGH (ref <117)

## 2014-06-05 LAB — TSH: TSH: 1.638 u[IU]/mL (ref 0.350–4.500)

## 2014-06-05 NOTE — Progress Notes (Signed)
Subjective:    Patient ID: Priscilla Butler, female    DOB: January 11, 1951, 63 y.o.   MRN: [id number]  HPI Priscilla Butler is here for follow up  Doing well . She has lost 11 lbs since October intentionally to help with her blood sugar.     Her mother has moved here from [facility] and Priscilla Butler is having a good summer with her 3 grand-children  Tolerating  Zocor well no myalgias.   She has decreased hearing in left ear.  No Known Allergies Past Medical History  Diagnosis Date  . Hyperglycemia   . Menopause   . Osteopenia   . DeQuervain's disease (tenosynovitis)   . Breast cancer 01/2008    Stage I, right breast  . Hyperlipidemia    Past Surgical History  Procedure Laterality Date  . Wisdom tooth extraction  1972  . Breast lumpectomy  03/2008    Right breast  . Skin surgery      facial  skin cancer   History   Social History  . Marital Status: Married    Spouse Name: [name]    Number of Children: 1  . Years of Education: BA   Occupational History  . Retired    Social History Main Topics  . Smoking status: Former Smoker    Quit date: 11/02/1999  . Smokeless tobacco: Never Used  . Alcohol Use: 1.8 oz/week    3 Cans of beer per week  . Drug Use: No  . Sexual Activity: Yes    Partners: Male    Birth Control/ Protection: Post-menopausal   Other Topics Concern  . Not on file   Social History Narrative  . No narrative on file   Family History  Problem Relation Age of Onset  . Breast cancer Maternal Grandmother   . Cancer Maternal Grandfather     lung  . Hypertension Mother   . Hyperlipidemia Mother    Patient Active Problem List   Diagnosis Date Noted  . Skin cancer, basal cell 12/04/2012  . Hyperlipidemia   . Hyperglycemia   . DeQuervain's disease (tenosynovitis)   . Menopause   . Osteopenia   . HX: breast cancer 05/26/2011  . Breast cancer 01/31/2008   Current Outpatient Prescriptions on File Prior to Visit  Medication Sig Dispense Refill  . aspirin 81 MG tablet Take  81 mg by mouth daily.        . calcium-vitamin D (OSCAL WITH D) 500-200 MG-UNIT per tablet Take 1 tablet by mouth daily.        . Cholecalciferol (VITAMIN D3) 1000 UNITS CAPS Take 1 capsule by mouth daily.        . Multiple Vitamin (MULTIVITAMIN) tablet Take 1 tablet by mouth daily.        . Omega-3 Fatty Acids (FISH OIL) 1000 MG CAPS Take 1 capsule by mouth 2 (two) times daily.        . ONE TOUCH ULTRA TEST test strip TEST BLOOD SUGAR TWICE A DAY AS DIRECTED  100 each  12  . simvastatin (ZOCOR) 40 MG tablet TAKE 1 TABLET BY MOUTH ONCE DAILY  90 tablet  3   No current facility-administered medications on file prior to visit.        Review of Systems See HPI    Objective:   Physical Exam  Physical Exam  Nursing note and vitals reviewed.  Constitutional: She is oriented to person, place, and time. She appears well-developed and well-nourished.  HENT:  Head: Normocephalic and  atraumatic.  TM:  Left cerumen impaction  Right  TM normal Cardiovascular: Normal rate and regular rhythm. Exam reveals no gallop and no friction rub.  No murmur heard.  Pulmonary/Chest: Breath sounds normal. She has no wheezes. She has no rales.  Neurological: She is alert and oriented to person, place, and time.  Skin: Skin is warm and dry.  Psychiatric: She has a normal mood and affect. Her behavior is normal.            Assessment & Plan:  Hyperlipidemia  Continue Zocor  Will check all labs today.    Cerumen plug left ear  Successful irrigation today    Hyperglycemia  With intentional wt loss likely will help with glucoses.  Check today with [facility]  Schedule CPE

## 2014-06-05 NOTE — Patient Instructions (Signed)
To lab today      Schedule CPE

## 2014-06-06 ENCOUNTER — Encounter: Payer: Self-pay | Admitting: Internal Medicine

## 2014-06-06 LAB — VITAMIN D 25 HYDROXY (VIT D DEFICIENCY, FRACTURES): Vit D, 25-Hydroxy: 57 ng/mL (ref 30–89)

## 2014-06-19 ENCOUNTER — Other Ambulatory Visit: Payer: Self-pay | Admitting: Internal Medicine

## 2014-06-19 NOTE — Telephone Encounter (Signed)
Requested Medications     Medication name:  Name from pharmacy:  simvastatin (ZOCOR) 40 MG tablet  SIMVASTATIN 40 MG TABLET 40 MG TAB    Sig: TAKE 1 TABLET BY MOUTH DAILY    Dispense: 30 tablet Refills: 0 Start: 06/19/2014  Class: Normal    Requested on: 05/21/2014    Originally ordered on: 03/15/2011 Last refill: 05/21/2014 Order History and Details

## 2014-08-26 ENCOUNTER — Ambulatory Visit (INDEPENDENT_AMBULATORY_CARE_PROVIDER_SITE_OTHER): Payer: 59 | Admitting: Internal Medicine

## 2014-08-26 ENCOUNTER — Encounter: Payer: Self-pay | Admitting: Internal Medicine

## 2014-08-26 ENCOUNTER — Ambulatory Visit (HOSPITAL_BASED_OUTPATIENT_CLINIC_OR_DEPARTMENT_OTHER)
Admission: RE | Admit: 2014-08-26 | Discharge: 2014-08-26 | Disposition: A | Discharge status: Home / Self Care | Payer: 59 | Source: Ambulatory Visit | Attending: Diagnostic Radiology | Admitting: Diagnostic Radiology

## 2014-08-26 VITALS — BP 135/80 | HR 69 | Temp 100.5°F | Resp 20 | Ht 62.0 in | Wt 161.0 lb

## 2014-08-26 DIAGNOSIS — Z87891 Personal history of nicotine dependence: Secondary | ICD-10-CM | POA: Insufficient documentation

## 2014-08-26 DIAGNOSIS — R062 Wheezing: Secondary | ICD-10-CM

## 2014-08-26 DIAGNOSIS — R5081 Fever presenting with conditions classified elsewhere: Secondary | ICD-10-CM | POA: Diagnosis not present

## 2014-08-26 DIAGNOSIS — R05 Cough: Secondary | ICD-10-CM | POA: Diagnosis not present

## 2014-08-26 DIAGNOSIS — R509 Fever, unspecified: Secondary | ICD-10-CM | POA: Diagnosis not present

## 2014-08-26 DIAGNOSIS — Z853 Personal history of malignant neoplasm of breast: Secondary | ICD-10-CM | POA: Diagnosis not present

## 2014-08-26 DIAGNOSIS — R059 Cough, unspecified: Secondary | ICD-10-CM

## 2014-08-26 DIAGNOSIS — R6889 Other general symptoms and signs: Secondary | ICD-10-CM

## 2014-08-26 DIAGNOSIS — J189 Pneumonia, unspecified organism: Secondary | ICD-10-CM | POA: Insufficient documentation

## 2014-08-26 DIAGNOSIS — J40 Bronchitis, not specified as acute or chronic: Secondary | ICD-10-CM | POA: Diagnosis not present

## 2014-08-26 DIAGNOSIS — Z23 Encounter for immunization: Secondary | ICD-10-CM

## 2014-08-26 LAB — POCT INFLUENZA A/B
INFLUENZA A, POC: NEGATIVE
INFLUENZA B, POC: NEGATIVE

## 2014-08-26 MED ORDER — HYDROCOD POLST-CHLORPHEN POLST 10-8 MG/5ML PO LQCR: 5.0000 mL | Freq: Two times a day (BID) | ORAL | Status: DC | PRN

## 2014-08-26 MED ORDER — CEFTRIAXONE SODIUM 1 G IJ SOLR
1.0000 g | Freq: Once | INTRAMUSCULAR | Status: AC
  Administered 2014-08-26: 1 g via INTRAMUSCULAR

## 2014-08-26 MED ORDER — ALBUTEROL SULFATE HFA 108 (90 BASE) MCG/ACT IN AERS: INHALATION_SPRAY | RESPIRATORY_TRACT | Status: DC

## 2014-08-26 MED ORDER — ALBUTEROL SULFATE (2.5 MG/3ML) 0.083% IN NEBU
2.5000 mg | INHALATION_SOLUTION | Freq: Once | RESPIRATORY_TRACT | Status: AC
  Administered 2014-08-26: 2.5 mg via RESPIRATORY_TRACT

## 2014-08-26 MED ORDER — LEVOFLOXACIN 500 MG PO TABS: 500.0000 mg | ORAL_TABLET | Freq: Every day | ORAL | Status: DC

## 2014-08-26 NOTE — Progress Notes (Signed)
Subjective:    Patient ID: Priscilla Butler, female    DOB: 17-Nov-1950, 63 y.o.   MRN: [id number]  HPI Priscilla Butler is here for acute visit.     Began 9-10 days ago with productive  Cough of Stanko sputim, sore throat and now with fever.  NO chest pain   No Known Allergies Past Medical History  Diagnosis Date  . Hyperglycemia   . Menopause   . Osteopenia   . DeQuervain's disease (tenosynovitis)   . Breast cancer 01/2008    Stage I, right breast  . Hyperlipidemia    Past Surgical History  Procedure Laterality Date  . Wisdom tooth extraction  1972  . Breast lumpectomy  03/2008    Right breast  . Skin surgery      facial  skin cancer   History   Social History  . Marital Status: Married    Spouse Name: Priscilla Butler    Number of Children: 1  . Years of Education: BA   Occupational History  . Retired    Social History Main Topics  . Smoking status: Former Smoker    Quit date: 11/02/1999  . Smokeless tobacco: Never Used  . Alcohol Use: 1.8 oz/week    3 Cans of beer per week  . Drug Use: No  . Sexual Activity: Yes    Partners: Male    Birth Control/ Protection: Post-menopausal   Other Topics Concern  . Not on file   Social History Narrative  . No narrative on file   Family History  Problem Relation Age of Onset  . Breast cancer Maternal Grandmother   . Cancer Maternal Grandfather     lung  . Hypertension Mother   . Hyperlipidemia Mother    Patient Active Problem List   Diagnosis Date Noted  . Skin cancer, basal cell 12/04/2012  . Hyperlipidemia   . Hyperglycemia   . DeQuervain's disease (tenosynovitis)   . Menopause   . Osteopenia   . HX: breast cancer 05/26/2011  . Breast cancer 01/31/2008   Current Outpatient Prescriptions on File Prior to Visit  Medication Sig Dispense Refill  . aspirin 81 MG tablet Take 81 mg by mouth daily.        . calcium-vitamin D (OSCAL WITH D) 500-200 MG-UNIT per tablet Take 1 tablet by mouth daily.        . Cholecalciferol (VITAMIN D3)  1000 UNITS CAPS Take 1 capsule by mouth daily.        . Multiple Vitamin (MULTIVITAMIN) tablet Take 1 tablet by mouth daily.        . Omega-3 Fatty Acids (FISH OIL) 1000 MG CAPS Take 1 capsule by mouth 2 (two) times daily.        . ONE TOUCH ULTRA TEST test strip TEST BLOOD SUGAR TWICE A DAY AS DIRECTED  100 each  12  . simvastatin (ZOCOR) 40 MG tablet TAKE 1 TABLET BY MOUTH DAILY  90 tablet  1   No current facility-administered medications on file prior to visit.       Review of Systems See HPI    Objective:   Physical Exam  Physical Exam  Nursing note and vitals reviewed.  Constitutional: She is oriented to person, place, and time. She appears well-developed and well-nourished. She is cooperative.  HENT:  Head: Normocephalic and atraumatic.  Nose: Mucosal edema present.  Eyes: Conjunctivae and EOM are normal. Pupils are equal, round, and reactive to light.  Neck: Neck supple.  Cardiovascular: Regular rhythm,  normal heart sounds, intact distal pulses and normal pulses. Exam reveals no gallop and no friction rub.  No murmur heard.  Pulmonary/Chest: She has no wheezes. She has rhonchi. She has no rales.   Consolidative changes in LLL  Rare end exp wheezing  lleft base Neurological: She is alert and oriented to person, place, and time.  Skin: Skin is warm and dry. No abrasion, no bruising, no ecchymosis and no rash noted. No cyanosis. Nails show no clubbing.  Psychiatric: She has a normal mood and affect. Her speech is normal and behavior is normal.               Assessment & Plan:   LLL pneumonia  Rocephin 1 gm in office, Levaquin for 10 days,    Cough  Albuterol  MDI  q 8 hours  Tussionex q12h prn    Bronchospasm  Albuterol   See me in 3 days          Assessment & Plan:

## 2014-08-26 NOTE — Patient Instructions (Signed)
To pharmacy   Tale antibiotic once daily   See me Thursday at 11:15

## 2014-08-29 ENCOUNTER — Encounter: Payer: Self-pay | Admitting: Internal Medicine

## 2014-08-29 ENCOUNTER — Ambulatory Visit (INDEPENDENT_AMBULATORY_CARE_PROVIDER_SITE_OTHER): Payer: 59 | Admitting: Internal Medicine

## 2014-08-29 VITALS — BP 148/83 | HR 120 | Temp 99.3°F | Resp 20 | Wt 161.0 lb

## 2014-08-29 DIAGNOSIS — J189 Pneumonia, unspecified organism: Secondary | ICD-10-CM

## 2014-08-29 DIAGNOSIS — R05 Cough: Secondary | ICD-10-CM

## 2014-08-29 DIAGNOSIS — R059 Cough, unspecified: Secondary | ICD-10-CM

## 2014-08-29 DIAGNOSIS — J811 Chronic pulmonary edema: Secondary | ICD-10-CM

## 2014-08-29 MED ORDER — CEFTRIAXONE SODIUM 1 G IJ SOLR
1.0000 g | Freq: Once | INTRAMUSCULAR | Status: AC
  Administered 2014-08-29: 1 g via INTRAMUSCULAR

## 2014-08-29 MED ORDER — PREDNISONE 20 MG PO TABS: ORAL_TABLET | ORAL | Status: DC

## 2014-08-29 MED ORDER — METHYLPREDNISOLONE ACETATE 80 MG/ML IJ SUSP
160.0000 mg | Freq: Once | INTRAMUSCULAR | Status: AC
  Administered 2014-08-29: 160 mg via INTRAMUSCULAR

## 2014-08-29 MED ORDER — ALBUTEROL SULFATE (2.5 MG/3ML) 0.083% IN NEBU: INHALATION_SOLUTION | RESPIRATORY_TRACT | Status: DC

## 2014-08-29 MED ORDER — ALBUTEROL SULFATE (2.5 MG/3ML) 0.083% IN NEBU
2.5000 mg | INHALATION_SOLUTION | Freq: Once | RESPIRATORY_TRACT | Status: AC
  Administered 2014-08-29: 2.5 mg via RESPIRATORY_TRACT

## 2014-08-29 NOTE — Progress Notes (Signed)
Subjective:    Patient ID: Priscilla Butler, female    DOB: [birth year], 63 y.o.   MRN: [id number]  HPI  Priscilla Butler is here for follow up.  She feels somewhat better "not too much"   Reports fever is trending downward but still lots of lung congestion.  Cough productive clear mucous.  Using aluterol MDI and taking levaquin.  Still coughing quite a bit.  No chest pain    No Known Allergies Past Medical History  Diagnosis Date  . Hyperglycemia   . Menopause   . Osteopenia   . DeQuervain's disease (tenosynovitis)   . Breast cancer 01/2008    Stage I, right breast  . Hyperlipidemia    Past Surgical History  Procedure Laterality Date  . Wisdom tooth extraction  1972  . Breast lumpectomy  03/2008    Right breast  . Skin surgery      facial  skin cancer   History   Social History  . Marital Status: Married    Spouse Name: [name]    Number of Children: 1  . Years of Education: BA   Occupational History  . Retired    Social History Main Topics  . Smoking status: Former Smoker    Quit date: 11/02/1999  . Smokeless tobacco: Never Used  . Alcohol Use: 1.8 oz/week    3 Cans of beer per week  . Drug Use: No  . Sexual Activity: Yes    Partners: Male    Birth Control/ Protection: Post-menopausal   Other Topics Concern  . Not on file   Social History Narrative  . No narrative on file   Family History  Problem Relation Age of Onset  . Breast cancer Maternal Grandmother   . Cancer Maternal Grandfather     lung  . Hypertension Mother   . Hyperlipidemia Mother    Patient Active Problem List   Diagnosis Date Noted  . Skin cancer, basal cell 12/04/2012  . Hyperlipidemia   . Hyperglycemia   . DeQuervain's disease (tenosynovitis)   . Menopause   . Osteopenia   . HX: breast cancer 05/26/2011  . Breast cancer 01/31/2008   Current Outpatient Prescriptions on File Prior to Visit  Medication Sig Dispense Refill  . aspirin 81 MG tablet Take 81 mg by mouth daily.        .  calcium-vitamin D (OSCAL WITH D) 500-200 MG-UNIT per tablet Take 1 tablet by mouth daily.        . chlorpheniramine-HYDROcodone (TUSSIONEX PENNKINETIC ER) 10-8 MG/5ML LQCR Take 5 mLs by mouth every 12 (twelve) hours as needed for cough.  240 mL  0  . Cholecalciferol (VITAMIN D3) 1000 UNITS CAPS Take 1 capsule by mouth daily.        Marland Kitchen levofloxacin (LEVAQUIN) 500 MG tablet Take 1 tablet (500 mg total) by mouth daily.  10 tablet  0  . Multiple Vitamin (MULTIVITAMIN) tablet Take 1 tablet by mouth daily.        . Omega-3 Fatty Acids (FISH OIL) 1000 MG CAPS Take 1 capsule by mouth 2 (two) times daily.        . ONE TOUCH ULTRA TEST test strip TEST BLOOD SUGAR TWICE A DAY AS DIRECTED  100 each  12  . simvastatin (ZOCOR) 40 MG tablet TAKE 1 TABLET BY MOUTH DAILY  90 tablet  1   No current facility-administered medications on file prior to visit.      Review of Systems See HPI  Objective:   Physical Exam Physical Exam  Nursing note and vitals reviewed.  Constitutional: She is oriented to person, place, and time. She appears well-developed and well-nourished.  HENT:  Head: Normocephalic and atraumatic.  Cardiovascular: Normal rate and regular rhythm. Exam reveals no gallop and no friction rub.  No murmur heard.  Pulmonary/Chest: Breath sounds normal. Lots of rhonchii in left base but moving air better in left base.  Some rales Neurological: She is alert and oriented to person, place, and time.  Skin: Skin is warm and dry.  Psychiatric: She has a normal mood and affect. Her behavior is normal.          Assessment & Plan:  LLL pneumonia  Will give dep89medrol 160 mg in office with prednisone 60 mg taper.  Rocephin 1 gm in office  Continue levaquin  Check WBC today   Lung congestion  Will give mucinex,  Change to home HHN with albuterol tid  Via nebulizer   See me Monday .  Call on call service  Over week-end if any worsening

## 2014-08-29 NOTE — Patient Instructions (Signed)
See me Monday 30 mins   To pharmacy and lab today

## 2014-08-30 LAB — CBC WITH DIFFERENTIAL/PLATELET
Basophils Absolute: 0 10*3/uL (ref 0.0–0.1)
Basophils Relative: 0 % (ref 0–1)
EOS ABS: 0.3 10*3/uL (ref 0.0–0.7)
Eosinophils Relative: 3 % (ref 0–5)
HCT: 36.6 % (ref 36.0–46.0)
Hemoglobin: 13.5 g/dL (ref 12.0–15.0)
Lymphocytes Relative: 19 % (ref 12–46)
Lymphs Abs: 1.6 10*3/uL (ref 0.7–4.0)
MCH: 31.7 pg (ref 26.0–34.0)
MCHC: 36.9 g/dL — ABNORMAL HIGH (ref 30.0–36.0)
MCV: 85.9 fL (ref 78.0–100.0)
MONOS PCT: 11 % (ref 3–12)
Monocytes Absolute: 0.9 10*3/uL (ref 0.1–1.0)
Neutro Abs: 5.6 10*3/uL (ref 1.7–7.7)
Neutrophils Relative %: 67 % (ref 43–77)
Platelets: 327 10*3/uL (ref 150–400)
RBC: 4.26 MIL/uL (ref 3.87–5.11)
RDW: 13.6 % (ref 11.5–15.5)
WBC: 8.4 10*3/uL (ref 4.0–10.5)

## 2014-08-30 LAB — COMPREHENSIVE METABOLIC PANEL
ALT: 46 U/L — ABNORMAL HIGH (ref 0–35)
AST: 36 U/L (ref 0–37)
Albumin: 4 g/dL (ref 3.5–5.2)
Alkaline Phosphatase: 88 U/L (ref 39–117)
BILIRUBIN TOTAL: 0.8 mg/dL (ref 0.2–1.2)
BUN: 13 mg/dL (ref 6–23)
CALCIUM: 9.4 mg/dL (ref 8.4–10.5)
CHLORIDE: 99 meq/L (ref 96–112)
CO2: 29 meq/L (ref 19–32)
CREATININE: 0.7 mg/dL (ref 0.50–1.10)
Glucose, Bld: 143 mg/dL — ABNORMAL HIGH (ref 70–99)
Potassium: 3.9 mEq/L (ref 3.5–5.3)
SODIUM: 138 meq/L (ref 135–145)
TOTAL PROTEIN: 6.7 g/dL (ref 6.0–8.3)

## 2014-09-01 ENCOUNTER — Encounter: Payer: Self-pay | Admitting: Internal Medicine

## 2014-09-02 ENCOUNTER — Ambulatory Visit (INDEPENDENT_AMBULATORY_CARE_PROVIDER_SITE_OTHER): Payer: 59 | Admitting: Internal Medicine

## 2014-09-02 ENCOUNTER — Encounter: Payer: Self-pay | Admitting: Internal Medicine

## 2014-09-02 VITALS — BP 125/77 | HR 107 | Temp 98.4°F | Resp 16 | Ht 62.0 in | Wt 159.0 lb

## 2014-09-02 DIAGNOSIS — R05 Cough: Secondary | ICD-10-CM

## 2014-09-02 DIAGNOSIS — R059 Cough, unspecified: Secondary | ICD-10-CM

## 2014-09-02 DIAGNOSIS — J189 Pneumonia, unspecified organism: Secondary | ICD-10-CM

## 2014-09-02 DIAGNOSIS — J9801 Acute bronchospasm: Secondary | ICD-10-CM

## 2014-09-02 NOTE — Patient Instructions (Signed)
Keep appt with me in January

## 2014-09-02 NOTE — Progress Notes (Signed)
Subjective:    Patient ID: Priscilla Butler, female    DOB: October 30, 1951, 63 y.o.   MRN: [id number]  HPI  08/29/2014 LLL pneumonia Will give dep12medrol 160 mg in office with prednisone 60 mg taper. Rocephin 1 gm in office Continue levaquin Check WBC today   Lung congestion Will give mucinex, Change to home HHN with albuterol tid Via nebulizer   See me Monday . Call on call service Over week-end if any worsening   Today:  [name] returns for follow up of pneumonia .  Feeling much better  Less coughing no SOB  Fever is gone   No Known Allergies Past Medical History  Diagnosis Date  . Hyperglycemia   . Menopause   . Osteopenia   . DeQuervain's disease (tenosynovitis)   . Breast cancer 01/2008    Stage I, right breast  . Hyperlipidemia    Past Surgical History  Procedure Laterality Date  . Wisdom tooth extraction  1972  . Breast lumpectomy  03/2008    Right breast  . Skin surgery      facial  skin cancer   History   Social History  . Marital Status: Married    Spouse Name: Priscilla Butler    Number of Children: 1  . Years of Education: BA   Occupational History  . Retired    Social History Main Topics  . Smoking status: Former Smoker    Quit date: 11/02/1999  . Smokeless tobacco: Never Used  . Alcohol Use: 1.8 oz/week    3 Cans of beer per week  . Drug Use: No  . Sexual Activity:    Partners: Male    Birth Control/ Protection: Post-menopausal   Other Topics Concern  . Not on file   Social History Narrative   Family History  Problem Relation Age of Onset  . Breast cancer Maternal Grandmother   . Cancer Maternal Grandfather     lung  . Hypertension Mother   . Hyperlipidemia Mother    Patient Active Problem List   Diagnosis Date Noted  . Skin cancer, basal cell 12/04/2012  . Hyperlipidemia   . Hyperglycemia   . DeQuervain's disease (tenosynovitis)   . Menopause   . Osteopenia   . HX: breast cancer 05/26/2011  . Breast cancer 01/31/2008   Current  Outpatient Prescriptions on File Prior to Visit  Medication Sig Dispense Refill  . albuterol (PROVENTIL) (2.5 MG/3ML) 0.083% nebulizer solution Use nebulized solution every 8 hours for the 4 days 150 mL 0  . aspirin 81 MG tablet Take 81 mg by mouth daily.      . calcium-vitamin D (OSCAL WITH D) 500-200 MG-UNIT per tablet Take 1 tablet by mouth daily.      . chlorpheniramine-HYDROcodone (TUSSIONEX PENNKINETIC ER) 10-8 MG/5ML LQCR Take 5 mLs by mouth every 12 (twelve) hours as needed for cough. 240 mL 0  . Cholecalciferol (VITAMIN D3) 1000 UNITS CAPS Take 1 capsule by mouth daily.      Marland Kitchen levofloxacin (LEVAQUIN) 500 MG tablet Take 1 tablet (500 mg total) by mouth daily. 10 tablet 0  . Multiple Vitamin (MULTIVITAMIN) tablet Take 1 tablet by mouth daily.      . Omega-3 Fatty Acids (FISH OIL) 1000 MG CAPS Take 1 capsule by mouth 2 (two) times daily.      . ONE TOUCH ULTRA TEST test strip TEST BLOOD SUGAR TWICE A DAY AS DIRECTED 100 each 12  . predniSONE (DELTASONE) 20 MG tablet Take two tablets each am  for 3 days then 1 tablet each am for 3 days then stop 9 tablet 0  . simvastatin (ZOCOR) 40 MG tablet TAKE 1 TABLET BY MOUTH DAILY 90 tablet 1   No current facility-administered medications on file prior to visit.      Review of Systems See HPI     Objective:   Physical Exam Physical Exam  Nursing note and vitals reviewed. Pulse ox 93% Constitutional: She is oriented to person, place, and time. She appears well-developed and well-nourished.  HENT:  Head: Normocephalic and atraumatic.  Cardiovascular: Normal rate and regular rhythm. Exam reveals no gallop and no friction rub.  No murmur heard.  Pulmonary/Chest: Breath sounds normal. She has no wheezes. She has no rales.  Neurological: She is alert and oriented to person, place, and time.  Skin: Skin is warm and dry.  Psychiatric: She has a normal mood and affect. Her behavior is normal.       Assessment & Plan:

## 2014-11-26 ENCOUNTER — Other Ambulatory Visit: Payer: Self-pay | Admitting: *Deleted

## 2014-11-26 DIAGNOSIS — Z139 Encounter for screening, unspecified: Secondary | ICD-10-CM

## 2014-11-26 DIAGNOSIS — J690 Pneumonitis due to inhalation of food and vomit: Secondary | ICD-10-CM

## 2014-11-27 ENCOUNTER — Telehealth: Payer: Self-pay | Admitting: *Deleted

## 2014-11-27 ENCOUNTER — Encounter: Payer: Self-pay | Admitting: Internal Medicine

## 2014-11-27 ENCOUNTER — Ambulatory Visit (HOSPITAL_BASED_OUTPATIENT_CLINIC_OR_DEPARTMENT_OTHER)
Admission: RE | Admit: 2014-11-27 | Discharge: 2014-11-27 | Disposition: A | Discharge status: Home / Self Care | Payer: 59 | Source: Ambulatory Visit | Attending: Internal Medicine | Admitting: Internal Medicine

## 2014-11-27 DIAGNOSIS — J189 Pneumonia, unspecified organism: Secondary | ICD-10-CM | POA: Diagnosis not present

## 2014-11-27 DIAGNOSIS — J690 Pneumonitis due to inhalation of food and vomit: Secondary | ICD-10-CM

## 2014-11-27 LAB — LIPID PANEL
Cholesterol: 212 mg/dL — ABNORMAL HIGH (ref 0–200)
HDL: 58 mg/dL (ref >39)
LDL Cholesterol: 136 mg/dL — ABNORMAL HIGH (ref 0–99)
TRIGLYCERIDES: 90 mg/dL (ref <150)
Total CHOL/HDL Ratio: 3.7 Ratio
VLDL: 18 mg/dL (ref 0–40)

## 2014-11-27 LAB — CBC
HEMATOCRIT: 40.6 % (ref 36.0–46.0)
Hemoglobin: 13.5 g/dL (ref 12.0–15.0)
MCH: 29 pg (ref 26.0–34.0)
MCHC: 33.3 g/dL (ref 30.0–36.0)
MCV: 87.3 fL (ref 78.0–100.0)
MPV: 9.5 fL (ref 8.6–12.4)
Platelets: 267 10*3/uL (ref 150–400)
RBC: 4.65 MIL/uL (ref 3.87–5.11)
RDW: 14.9 % (ref 11.5–15.5)
WBC: 5.4 10*3/uL (ref 4.0–10.5)

## 2014-11-27 LAB — TSH: TSH: 1.985 u[IU]/mL (ref 0.350–4.500)

## 2014-11-27 NOTE — Telephone Encounter (Signed)
-----   Message from Lanice Shirts, MD sent at 11/27/2014 11:07 AM EST ----- Call pt and let her know that her CXR is all cleared from her pneumonia

## 2014-11-27 NOTE — Telephone Encounter (Signed)
I spoke with Priscilla Butler and let her know that her CXR was normal

## 2014-11-28 LAB — VITAMIN D 25 HYDROXY (VIT D DEFICIENCY, FRACTURES): VIT D 25 HYDROXY: 40 ng/mL (ref 30–100)

## 2014-11-28 NOTE — Progress Notes (Signed)
Mailed a copy to patient

## 2014-12-04 ENCOUNTER — Other Ambulatory Visit: Payer: Self-pay | Admitting: *Deleted

## 2014-12-04 ENCOUNTER — Ambulatory Visit (INDEPENDENT_AMBULATORY_CARE_PROVIDER_SITE_OTHER): Payer: 59 | Admitting: Internal Medicine

## 2014-12-04 ENCOUNTER — Encounter: Payer: Self-pay | Admitting: *Deleted

## 2014-12-04 ENCOUNTER — Encounter: Payer: Self-pay | Admitting: Internal Medicine

## 2014-12-04 VITALS — BP 130/78 | HR 96 | Resp 16 | Ht 62.0 in | Wt 158.0 lb

## 2014-12-04 DIAGNOSIS — Z139 Encounter for screening, unspecified: Secondary | ICD-10-CM

## 2014-12-04 DIAGNOSIS — M8580 Other specified disorders of bone density and structure, unspecified site: Secondary | ICD-10-CM

## 2014-12-04 DIAGNOSIS — J189 Pneumonia, unspecified organism: Secondary | ICD-10-CM

## 2014-12-04 DIAGNOSIS — E785 Hyperlipidemia, unspecified: Secondary | ICD-10-CM

## 2014-12-04 DIAGNOSIS — Z01812 Encounter for preprocedural laboratory examination: Secondary | ICD-10-CM

## 2014-12-04 DIAGNOSIS — Z0000 Encounter for general adult medical examination without abnormal findings: Secondary | ICD-10-CM

## 2014-12-04 DIAGNOSIS — Z1151 Encounter for screening for human papillomavirus (HPV): Secondary | ICD-10-CM

## 2014-12-04 DIAGNOSIS — Z124 Encounter for screening for malignant neoplasm of cervix: Secondary | ICD-10-CM

## 2014-12-04 LAB — COMPREHENSIVE METABOLIC PANEL
ALT: 30 U/L (ref 0–35)
AST: 22 U/L (ref 0–37)
Albumin: 4.2 g/dL (ref 3.5–5.2)
Alkaline Phosphatase: 63 U/L (ref 39–117)
BUN: 19 mg/dL (ref 6–23)
CHLORIDE: 100 meq/L (ref 96–112)
CO2: 27 meq/L (ref 19–32)
Calcium: 9.9 mg/dL (ref 8.4–10.5)
Creat: 0.72 mg/dL (ref 0.50–1.10)
Glucose, Bld: 100 mg/dL — ABNORMAL HIGH (ref 70–99)
Potassium: 4.2 mEq/L (ref 3.5–5.3)
SODIUM: 139 meq/L (ref 135–145)
TOTAL PROTEIN: 6.9 g/dL (ref 6.0–8.3)
Total Bilirubin: 0.9 mg/dL (ref 0.2–1.2)

## 2014-12-04 LAB — POCT URINALYSIS DIPSTICK
Bilirubin, UA: NEGATIVE
GLUCOSE UA: NEGATIVE
Ketones, UA: NEGATIVE
LEUKOCYTES UA: NEGATIVE
Nitrite, UA: POSITIVE
PH UA: 6.5
PROTEIN UA: NEGATIVE
RBC UA: NEGATIVE
Spec Grav, UA: 1.01
Urobilinogen, UA: 0.2

## 2014-12-04 NOTE — Progress Notes (Signed)
Subjective:    Patient ID: Priscilla Butler, female    DOB: 02/04/1951, 64 y.o.   MRN: [id number]  HPI  09/2014 note 08/29/2014 LLL pneumonia Will give dep77medrol 160 mg in office with prednisone 60 mg taper. Rocephin 1 gm in office Continue levaquin Check WBC today   Lung congestion Will give mucinex, Change to home HHN with albuterol tid Via nebulizer   See me Monday . Call on call service Over week-end if any worsening   Today:  Priscilla Butler returns for follow up of pneumonia . Feeling much better Less coughing no   TODAY:  Priscilla Butler is here for CPE.    HM: needs pap today  Declines  Prevnar 13 until checking with insurance,  20 year smoker quit age 32   UTD with mm  Due for Dexa in October  Pneumonia  F/U CXR shows complete clearing  Hyperlipidemia  See labs pt admits that she misses some doses of her Zocor   No Known Allergies Past Medical History  Diagnosis Date  . Hyperglycemia   . Menopause   . Osteopenia   . DeQuervain's disease (tenosynovitis)   . Breast cancer 01/2008    Stage I, right breast  . Hyperlipidemia    Past Surgical History  Procedure Laterality Date  . Wisdom tooth extraction  1972  . Breast lumpectomy  03/2008    Right breast  . Skin surgery      facial  skin cancer   History   Social History  . Marital Status: Married    Spouse Name: [name]    Number of Children: 1  . Years of Education: BA   Occupational History  . Retired    Social History Main Topics  . Smoking status: Former Smoker    Quit date: 11/02/1999  . Smokeless tobacco: Never Used  . Alcohol Use: 1.8 oz/week    3 Cans of beer per week  . Drug Use: No  . Sexual Activity:    Partners: Male    Birth Control/ Protection: Post-menopausal   Other Topics Concern  . Not on file   Social History Narrative   Family History  Problem Relation Age of Onset  . Breast cancer Maternal Grandmother   . Cancer Maternal Grandfather     lung  . Hypertension Mother   .  Hyperlipidemia Mother    Patient Active Problem List   Diagnosis Date Noted  . Skin cancer, basal cell 12/04/2012  . Hyperlipidemia   . Hyperglycemia   . DeQuervain's disease (tenosynovitis)   . Menopause   . Osteopenia   . HX: breast cancer 05/26/2011  . Breast cancer 01/31/2008   Current Outpatient Prescriptions on File Prior to Visit  Medication Sig Dispense Refill  . albuterol (PROVENTIL) (2.5 MG/3ML) 0.083% nebulizer solution Use nebulized solution every 8 hours for the 4 days 150 mL 0  . aspirin 81 MG tablet Take 81 mg by mouth daily.      . calcium-vitamin D (OSCAL WITH D) 500-200 MG-UNIT per tablet Take 1 tablet by mouth daily.      . chlorpheniramine-HYDROcodone (TUSSIONEX PENNKINETIC ER) 10-8 MG/5ML LQCR Take 5 mLs by mouth every 12 (twelve) hours as needed for cough. 240 mL 0  . Cholecalciferol (VITAMIN D3) 1000 UNITS CAPS Take 1 capsule by mouth daily.      Marland Kitchen levofloxacin (LEVAQUIN) 500 MG tablet Take 1 tablet (500 mg total) by mouth daily. 10 tablet 0  . Multiple Vitamin (MULTIVITAMIN) tablet Take 1 tablet  by mouth daily.      . Omega-3 Fatty Acids (FISH OIL) 1000 MG CAPS Take 1 capsule by mouth 2 (two) times daily.      . ONE TOUCH ULTRA TEST test strip TEST BLOOD SUGAR TWICE A DAY AS DIRECTED 100 each 12  . predniSONE (DELTASONE) 20 MG tablet Take two tablets each am for 3 days then 1 tablet each am for 3 days then stop 9 tablet 0  . simvastatin (ZOCOR) 40 MG tablet TAKE 1 TABLET BY MOUTH DAILY 90 tablet 1   No current facility-administered medications on file prior to visit.      Review of Systems  Respiratory: Negative for cough, chest tightness, shortness of breath and wheezing.   Cardiovascular: Negative for chest pain, palpitations and leg swelling.       Objective:   Physical Exam  Physical Exam  Vital signs and nursing note reviewed  Constitutional: She is oriented to person, place, and time. She appears well-developed and well-nourished. She is  cooperative.  HENT:  Head: Normocephalic and atraumatic.  Right Ear: Tympanic membrane normal.  Left Ear: Tympanic membrane normal.  Nose: Nose normal.  Mouth/Throat: Oropharynx is clear and moist and mucous membranes are normal. No oropharyngeal exudate or posterior oropharyngeal erythema.  Eyes: Conjunctivae and EOM are normal. Pupils are equal, round, and reactive to light.  Neck: Neck supple. No JVD present. Carotid bruit is not present. No mass and no thyromegaly present.  Cardiovascular: Regular rhythm, normal heart sounds, intact distal pulses and normal pulses.  Exam reveals no gallop and no friction rub.   No murmur heard. Pulses:      Dorsalis pedis pulses are 2+ on the right side, and 2+ on the left side.  Pulmonary/Chest: Breath sounds normal. She has no wheezes. She has no rhonchi. She has no rales. Right breast exhibits no mass, no nipple discharge and no skin change. Left breast exhibits no mass, no nipple discharge and no skin change.  Abdominal: Soft. Bowel sounds are normal. She exhibits no distension and no mass. There is no hepatosplenomegaly. There is no tenderness. There is no CVA tenderness.  Genitourinary: Rectum normal, vagina normal and uterus normal. Rectal exam shows no mass. Guaiac negative stool. No labial fusion. There is no lesion on the right labia. There is no lesion on the left labia. Cervix exhibits no motion tenderness. Right adnexum displays no mass, no tenderness and no fullness. Left adnexum displays no mass, no tenderness and no fullness. No erythema around the vagina.  Musculoskeletal:       No active synovitis to any joint.    Lymphadenopathy:       Right cervical: No superficial cervical adenopathy present.      Left cervical: No superficial cervical adenopathy present.       Right axillary: No pectoral and no lateral adenopathy present.       Left axillary: No pectoral and no lateral adenopathy present.      Right: No inguinal adenopathy present.        Left: No inguinal adenopathy present.  Neurological: She is alert and oriented to person, place, and time. She has normal strength and normal reflexes. No cranial nerve deficit or sensory deficit. She displays a negative Romberg sign. Coordination and gait normal.  Skin: Skin is warm and dry. No abrasion, no bruising, no ecchymosis and no rash noted. No cyanosis. Nails show no clubbing.  Psychiatric: She has a normal mood and affect. Her speech is normal and  behavior is normal.          Assessment & Plan:   HM   Pap today  See above   Dexa in October    Hyperlipidemia  Continue Zocor  Advised to be sure to take daily  Breast CA  Managed oncology  Last mm neg   Osteopenia wt bearing exercise  Calcium vit d  CAP  Resolved   Will check with insurance reg Prevnar 13   Skin CA  Advised yearly skin surveillance    See me as needed       Assessment & Plan:  HM:

## 2014-12-05 ENCOUNTER — Encounter: Payer: Self-pay | Admitting: Internal Medicine

## 2014-12-06 LAB — CYTOLOGY - PAP

## 2014-12-25 ENCOUNTER — Other Ambulatory Visit: Payer: Self-pay | Admitting: *Deleted

## 2014-12-25 NOTE — Telephone Encounter (Signed)
Refill request

## 2014-12-26 MED ORDER — SIMVASTATIN 40 MG PO TABS: 40.0000 mg | ORAL_TABLET | Freq: Every day | ORAL | Status: DC

## 2015-04-25 ENCOUNTER — Telehealth: Payer: Self-pay

## 2015-04-25 NOTE — Telephone Encounter (Signed)
Mammogram results dtd 04/24/15 rcvd from [facility].  Reviewed by Dr. Jana Hakim.  Sent to scan.

## 2015-05-27 ENCOUNTER — Ambulatory Visit (INDEPENDENT_AMBULATORY_CARE_PROVIDER_SITE_OTHER): Payer: 59 | Admitting: Physician Assistant

## 2015-05-27 ENCOUNTER — Encounter: Payer: Self-pay | Admitting: Physician Assistant

## 2015-05-27 ENCOUNTER — Telehealth: Payer: Self-pay | Admitting: Internal Medicine

## 2015-05-27 VITALS — BP 134/80 | HR 85 | Temp 97.5°F | Ht 62.0 in | Wt 168.6 lb

## 2015-05-27 DIAGNOSIS — E785 Hyperlipidemia, unspecified: Secondary | ICD-10-CM

## 2015-05-27 DIAGNOSIS — R300 Dysuria: Secondary | ICD-10-CM | POA: Insufficient documentation

## 2015-05-27 LAB — POCT URINALYSIS DIPSTICK
Bilirubin, UA: NEGATIVE
Blood, UA: NEGATIVE
Glucose, UA: NEGATIVE
Ketones, UA: NEGATIVE
Nitrite, UA: NEGATIVE
PROTEIN UA: NEGATIVE
Spec Grav, UA: 1.01
UROBILINOGEN UA: 0.2
pH, UA: 6

## 2015-05-27 MED ORDER — CIPROFLOXACIN HCL 500 MG PO TABS: 500.0000 mg | ORAL_TABLET | Freq: Two times a day (BID) | ORAL | Status: DC

## 2015-05-27 MED ORDER — SIMVASTATIN 40 MG PO TABS: 40.0000 mg | ORAL_TABLET | Freq: Every day | ORAL | Status: DC

## 2015-05-27 NOTE — Progress Notes (Signed)
Pre visit review using our clinic review tool, if applicable. No additional management support is needed unless otherwise documented below in the visit note. 

## 2015-05-27 NOTE — Patient Instructions (Addendum)
Please continue your Simvastatin and Aspirin as directed. Please follow-up with Dr. Charlett Blake as scheduled for your "New Patient" appointment.  Your symptoms are consistent with a bladder infection, also called acute cystitis. Please take your antibiotic (Cipro) as directed until all pills are gone.  Stay very well hydrated.  Consider a daily probiotic (Align, Culturelle, or Activia) to help prevent stomach upset caused by the antibiotic.  Taking a probiotic daily may also help prevent recurrent UTIs.  Also consider taking AZO (Phenazopyridine) tablets to help decrease pain with urination.  I will call you with your urine testing results.  We will change antibiotics if indicated.  Call or return to clinic if symptoms are not resolved by completion of antibiotic.   Urinary Tract Infection A urinary tract infection (UTI) can occur any place along the urinary tract. The tract includes the kidneys, ureters, bladder, and urethra. A type of germ called bacteria often causes a UTI. UTIs are often helped with antibiotic medicine.  HOME CARE   If given, take antibiotics as told by your doctor. Finish them even if you start to feel better.  Drink enough fluids to keep your pee (urine) clear or pale yellow.  Avoid tea, drinks with caffeine, and bubbly (carbonated) drinks.  Pee often. Avoid holding your pee in for a long time.  Pee before and after having sex (intercourse).  Wipe from front to back after you poop (bowel movement) if you are a woman. Use each tissue only once. GET HELP RIGHT AWAY IF:   You have back pain.  You have lower belly (abdominal) pain.  You have chills.  You feel sick to your stomach (nauseous).  You throw up (vomit).  Your burning or discomfort with peeing does not go away.  You have a fever.  Your symptoms are not better in 3 days. MAKE SURE YOU:   Understand these instructions.  Will watch your condition.  Will get help right away if you are not doing well or  get worse. Document Released: 04/05/2008 Document Revised: 07/12/2012 Document Reviewed: 05/18/2012 [facility] Patient Information 2015 [place], [place]. This information is not intended to replace advice given to you by your health care provider. Make sure you discuss any questions you have with your health care provider.

## 2015-05-27 NOTE — Telephone Encounter (Signed)
I hate it but I really should not write a prescription for someone our group has never seen there are medicolegal issues. Please find somewhere to squeeze this patient in for a 15 minute meet and greet and we will still do the full visit in the fall. Then I can prescribe the Simvastatin

## 2015-05-27 NOTE — Progress Notes (Signed)
Patient presents to clinic today as a new patient to discuss medications and for acute complaint. Will formally establish care with Dr. Charlett Blake in October.  Patient currently on simvastatin for hyperlipidemia. Endorses being on 40 mg for several years with good control of cholesterol. Denies myalgias with medication. Is currently out of medication.  Patient also endorses 1 week of dysuria and cloudy urine. Denies urgency, nausea, vomiting or back pain. Notes some suprapubic pressure.   Past Medical History  Diagnosis Date  . Hyperglycemia   . Menopause   . Osteopenia   . DeQuervain's disease (tenosynovitis)   . Breast cancer 01/2008    Stage I, right breast  . Hyperlipidemia     Current Outpatient Prescriptions on File Prior to Visit  Medication Sig Dispense Refill  . aspirin 81 MG tablet Take 81 mg by mouth daily.      . calcium-vitamin D (OSCAL WITH D) 500-200 MG-UNIT per tablet Take 1 tablet by mouth daily.      . Cholecalciferol (VITAMIN D3) 1000 UNITS CAPS Take 1 capsule by mouth daily.      . Multiple Vitamin (MULTIVITAMIN) tablet Take 1 tablet by mouth daily.      . Omega-3 Fatty Acids (FISH OIL) 1000 MG CAPS Take 1 capsule by mouth 2 (two) times daily.      . ONE TOUCH ULTRA TEST test strip TEST BLOOD SUGAR TWICE A DAY AS DIRECTED 100 each 12   No current facility-administered medications on file prior to visit.    No Known Allergies  Family History  Problem Relation Age of Onset  . Breast cancer Maternal Grandmother   . Cancer Maternal Grandfather     lung  . Hypertension Mother   . Hyperlipidemia Mother     History   Social History  . Marital Status: Married    Spouse Name: [name]  . Number of Children: 1  . Years of Education: BA   Occupational History  . Retired    Social History Main Topics  . Smoking status: Former Smoker    Quit date: 11/02/1999  . Smokeless tobacco: Never Used  . Alcohol Use: 1.8 oz/week    3 Cans of beer per week  . Drug Use:  No  . Sexual Activity:    Partners: Male    Birth Control/ Protection: Post-menopausal   Other Topics Concern  . None   Social History Narrative   Review of Systems  Constitutional: Negative for fever, chills, weight loss and malaise/fatigue.  Genitourinary: Positive for dysuria. Negative for urgency, frequency and flank pain.  Musculoskeletal: Negative for myalgias.     BP 134/80 mmHg  Pulse 85  Temp(Src) 97.5 F (36.4 C) (Oral)  Ht 5\' 2"  (1.575 m)  Wt 168 lb 9.6 oz (76.476 kg)  BMI 30.83 kg/m2  SpO2 97%  Physical Exam  Constitutional: She is oriented to person, place, and time and well-developed, well-nourished, and in no distress.  HENT:  Head: Normocephalic and atraumatic.  Eyes: Conjunctivae are normal.  Neck: Neck supple.  Cardiovascular: Normal rate, regular rhythm, normal heart sounds and intact distal pulses.   Pulmonary/Chest: Effort normal and breath sounds normal. No respiratory distress. She has no wheezes. She has no rales. She exhibits no tenderness.  Neurological: She is alert and oriented to person, place, and time.  Skin: Skin is warm and dry. No rash noted.  Psychiatric: Affect normal.  Vitals reviewed.   No results found for this or any previous visit (from the  past 2160 hour(s)).  Assessment/Plan: Hyperlipidemia Tolerating statin without myalgias. Lipids previously well-controlled on EMR review. Will grant 90 day supply to pharmacy until she formally establishes with Dr. Charlett Blake.  Continue TLCs.  Dysuria Intermittent dysuria and cloudiness. Exam unremarkable. Urine dip reveals + LE. Will send urine for culture. Rx Cipro 500 mg BID x 3 days while waiting on culture. Supportive measures reviewed. Will alter regimen based on culture results.

## 2015-05-27 NOTE — Telephone Encounter (Signed)
Called the pharmacist Suezanne Jacquet at [facility] informed of PCP instructions.  Will forward note to a scheduler to schedule appointment.

## 2015-05-27 NOTE — Telephone Encounter (Signed)
Caller name: Suezanne Jacquet at [facility]  Reason for call: He sent refill request for simvastatin for pt. She is out of meds. Transferring to Dr. Charlett Blake from Dr. Coralyn Mark. Appt 08/08/15.

## 2015-05-27 NOTE — Telephone Encounter (Signed)
Called pt and scheduled appt for today with Elyn Aquas. (15 min)

## 2015-05-27 NOTE — Telephone Encounter (Signed)
Advise on this refill please.

## 2015-05-27 NOTE — Assessment & Plan Note (Signed)
Tolerating statin without myalgias. Lipids previously well-controlled on EMR review. Will grant 90 day supply to pharmacy until she formally establishes with Dr. Charlett Blake.  Continue TLCs.

## 2015-05-27 NOTE — Assessment & Plan Note (Addendum)
Intermittent dysuria and cloudiness. Exam unremarkable. Urine dip reveals + LE. Will send urine for culture. Rx Cipro 500 mg BID x 3 days while waiting on culture. Supportive measures reviewed. Will alter regimen based on culture results.

## 2015-05-28 LAB — CULTURE, URINE COMPREHENSIVE
Colony Count: NO GROWTH
ORGANISM ID, BACTERIA: NO GROWTH

## 2015-08-07 ENCOUNTER — Telehealth: Payer: Self-pay | Admitting: Behavioral Health

## 2015-08-07 ENCOUNTER — Encounter: Payer: Self-pay | Admitting: Behavioral Health

## 2015-08-07 NOTE — Telephone Encounter (Signed)
Pre-Visit Call completed with patient and chart updated.   Pre-Visit Info documented in Specialty Comments under [employer].    

## 2015-08-08 ENCOUNTER — Ambulatory Visit (HOSPITAL_BASED_OUTPATIENT_CLINIC_OR_DEPARTMENT_OTHER)
Admission: RE | Admit: 2015-08-08 | Discharge: 2015-08-08 | Disposition: A | Discharge status: Home / Self Care | Payer: 59 | Source: Ambulatory Visit | Attending: Family Medicine | Admitting: Family Medicine

## 2015-08-08 ENCOUNTER — Encounter: Payer: Self-pay | Admitting: Family Medicine

## 2015-08-08 ENCOUNTER — Ambulatory Visit (INDEPENDENT_AMBULATORY_CARE_PROVIDER_SITE_OTHER): Payer: 59 | Admitting: Family Medicine

## 2015-08-08 VITALS — BP 130/84 | HR 98 | Temp 97.9°F | Ht 62.0 in | Wt 166.5 lb

## 2015-08-08 DIAGNOSIS — M25511 Pain in right shoulder: Secondary | ICD-10-CM

## 2015-08-08 DIAGNOSIS — M542 Cervicalgia: Secondary | ICD-10-CM | POA: Diagnosis present

## 2015-08-08 DIAGNOSIS — M50322 Other cervical disc degeneration at C5-C6 level: Secondary | ICD-10-CM | POA: Diagnosis not present

## 2015-08-08 DIAGNOSIS — R739 Hyperglycemia, unspecified: Secondary | ICD-10-CM

## 2015-08-08 DIAGNOSIS — Z8619 Personal history of other infectious and parasitic diseases: Secondary | ICD-10-CM | POA: Insufficient documentation

## 2015-08-08 DIAGNOSIS — E785 Hyperlipidemia, unspecified: Secondary | ICD-10-CM

## 2015-08-08 DIAGNOSIS — Z853 Personal history of malignant neoplasm of breast: Secondary | ICD-10-CM

## 2015-08-08 DIAGNOSIS — IMO0001 Reserved for inherently not codable concepts without codable children: Secondary | ICD-10-CM

## 2015-08-08 DIAGNOSIS — Z9289 Personal history of other medical treatment: Secondary | ICD-10-CM | POA: Diagnosis not present

## 2015-08-08 DIAGNOSIS — R030 Elevated blood-pressure reading, without diagnosis of hypertension: Secondary | ICD-10-CM

## 2015-08-08 NOTE — Patient Instructions (Addendum)
Probiotics daily [employer] or online [employer] company has a 10 strain probiotic, on line at [email or web address]  Salon Pas patches or gel or Aspercreme cream or patch with lidocaine  Preventive Care for Adults, Female A healthy lifestyle and preventive care can promote health and wellness. Preventive health guidelines for women include the following key practices.  A routine yearly physical is a good way to check with your health care provider about your health and preventive screening. It is a chance to share any concerns and updates on your health and to receive a thorough exam.  Visit your dentist for a routine exam and preventive care every 6 months. Brush your teeth twice a day and floss once a day. Good oral hygiene prevents tooth decay and gum disease.  The frequency of eye exams is based on your age, health, family medical history, use of contact lenses, and other factors. Follow your health care provider's recommendations for frequency of eye exams.  Eat a healthy diet. Foods like vegetables, fruits, whole grains, low-fat dairy products, and lean protein foods contain the nutrients you need without too many calories. Decrease your intake of foods high in solid fats, added sugars, and salt. Eat the right amount of calories for you.Get information about a proper diet from your health care provider, if necessary.  Regular physical exercise is one of the most important things you can do for your health. Most adults should get at least 150 minutes of moderate-intensity exercise (any activity that increases your heart rate and causes you to sweat) each week. In addition, most adults need muscle-strengthening exercises on 2 or more days a week.  Maintain a healthy weight. The body mass index (BMI) is a screening tool to identify possible weight problems. It provides an estimate of body fat based on height and weight. Your health care provider can find your BMI and can  help you achieve or maintain a healthy weight.For adults 20 years and older:  A BMI below 18.5 is considered underweight.  A BMI of 18.5 to 24.9 is normal.  A BMI of 25 to 29.9 is considered overweight.  A BMI of 30 and above is considered obese.  Maintain normal blood lipids and cholesterol levels by exercising and minimizing your intake of saturated fat. Eat a balanced diet with plenty of fruit and vegetables. Blood tests for lipids and cholesterol should begin at age 71 and be repeated every 5 years. If your lipid or cholesterol levels are high, you are over 50, or you are at high risk for heart disease, you may need your cholesterol levels checked more frequently.Ongoing high lipid and cholesterol levels should be treated with medicines if diet and exercise are not working.  If you smoke, find out from your health care provider how to quit. If you do not use tobacco, do not start.  Lung cancer screening is recommended for adults aged 94-80 years who are at high risk for developing lung cancer because of a history of smoking. A yearly low-dose CT scan of the lungs is recommended for people who have at least a 30-pack-year history of smoking and are a current smoker or have quit within the past 15 years. A pack year of smoking is smoking an average of 1 pack of cigarettes a day for 1 year (for example: 1 pack a day for 30 years or 2 packs a day for 15 years). Yearly screening should continue until the smoker has stopped smoking for at least  15 years. Yearly screening should be stopped for people who develop a health problem that would prevent them from having lung cancer treatment.  If you are pregnant, do not drink alcohol. If you are breastfeeding, be very cautious about drinking alcohol. If you are not pregnant and choose to drink alcohol, do not have more than 1 drink per day. One drink is considered to be 12 ounces (355 mL) of beer, 5 ounces (148 mL) of wine, or 1.5 ounces (44 mL) of  liquor.  Avoid use of street drugs. Do not share needles with anyone. Ask for help if you need support or instructions about stopping the use of drugs.  High blood pressure causes heart disease and increases the risk of stroke. Your blood pressure should be checked at least every 1 to 2 years. Ongoing high blood pressure should be treated with medicines if weight loss and exercise do not work.  If you are 55-79 years old, ask your health care provider if you should take aspirin to prevent strokes.  Diabetes screening is done by taking a blood sample to check your blood glucose level after you have not eaten for a certain period of time (fasting). If you are not overweight and you do not have risk factors for diabetes, you should be screened once every 3 years starting at age 45. If you are overweight or obese and you are 40-70 years of age, you should be screened for diabetes every year as part of your cardiovascular risk assessment.  Breast cancer screening is essential preventive care for women. You should practice "breast self-awareness." This means understanding the normal appearance and feel of your breasts and may include breast self-examination. Any changes detected, no matter how small, should be reported to a health care provider. Women in their 20s and 30s should have a clinical breast exam (CBE) by a health care provider as part of a regular health exam every 1 to 3 years. After age 40, women should have a CBE every year. Starting at age 40, women should consider having a mammogram (breast X-ray test) every year. Women who have a family history of breast cancer should talk to their health care provider about genetic screening. Women at a high risk of breast cancer should talk to their health care providers about having an MRI and a mammogram every year.  Breast cancer gene (BRCA)-related cancer risk assessment is recommended for women who have family members with BRCA-related cancers.  BRCA-related cancers include breast, ovarian, tubal, and peritoneal cancers. Having family members with these cancers may be associated with an increased risk for harmful changes (mutations) in the breast cancer genes BRCA1 and BRCA2. Results of the assessment will determine the need for genetic counseling and BRCA1 and BRCA2 testing.  Your health care provider may recommend that you be screened regularly for cancer of the pelvic organs (ovaries, uterus, and vagina). This screening involves a pelvic examination, including checking for microscopic changes to the surface of your cervix (Pap test). You may be encouraged to have this screening done every 3 years, beginning at age 21.  For women ages 30-65, health care providers may recommend pelvic exams and Pap testing every 3 years, or they may recommend the Pap and pelvic exam, combined with testing for human papilloma virus (HPV), every 5 years. Some types of HPV increase your risk of cervical cancer. Testing for HPV may also be done on women of any age with unclear Pap test results.  Other health care providers   may not recommend any screening for nonpregnant women who are considered low risk for pelvic cancer and who do not have symptoms. Ask your health care provider if a screening pelvic exam is right for you.  If you have had past treatment for cervical cancer or a condition that could lead to cancer, you need Pap tests and screening for cancer for at least 20 years after your treatment. If Pap tests have been discontinued, your risk factors (such as having a new sexual partner) need to be reassessed to determine if screening should resume. Some women have medical problems that increase the chance of getting cervical cancer. In these cases, your health care provider may recommend more frequent screening and Pap tests.  Colorectal cancer can be detected and often prevented. Most routine colorectal cancer screening begins at the age of 50 years and  continues through age 75 years. However, your health care provider may recommend screening at an earlier age if you have risk factors for colon cancer. On a yearly basis, your health care provider may provide home test kits to check for hidden blood in the stool. Use of a small camera at the end of a tube, to directly examine the colon (sigmoidoscopy or colonoscopy), can detect the earliest forms of colorectal cancer. Talk to your health care provider about this at age 50, when routine screening begins. Direct exam of the colon should be repeated every 5-10 years through age 75 years, unless early forms of precancerous polyps or small growths are found.  People who are at an increased risk for hepatitis B should be screened for this virus. You are considered at high risk for hepatitis B if:  You were born in a country where hepatitis B occurs often. Talk with your health care provider about which countries are considered high risk.  Your parents were born in a high-risk country and you have not received a shot to protect against hepatitis B (hepatitis B vaccine).  You have HIV or AIDS.  You use needles to inject street drugs.  You live with, or have sex with, someone who has hepatitis B.  You get hemodialysis treatment.  You take certain medicines for conditions like cancer, organ transplantation, and autoimmune conditions.  Hepatitis C blood testing is recommended for all people born from 1945 through 1965 and any individual with known risks for hepatitis C.  Practice safe sex. Use condoms and avoid high-risk sexual practices to reduce the spread of sexually transmitted infections (STIs). STIs include gonorrhea, chlamydia, syphilis, trichomonas, herpes, HPV, and human immunodeficiency virus (HIV). Herpes, HIV, and HPV are viral illnesses that have no cure. They can result in disability, cancer, and death.  You should be screened for sexually transmitted illnesses (STIs) including gonorrhea  and chlamydia if:  You are sexually active and are younger than 24 years.  You are older than 24 years and your health care provider tells you that you are at risk for this type of infection.  Your sexual activity has changed since you were last screened and you are at an increased risk for chlamydia or gonorrhea. Ask your health care provider if you are at risk.  If you are at risk of being infected with HIV, it is recommended that you take a prescription medicine daily to prevent HIV infection. This is called preexposure prophylaxis (PrEP). You are considered at risk if:  You are sexually active and do not regularly use condoms or know the HIV status of your partner(s).  You   take drugs by injection.  You are sexually active with a partner who has HIV.  Talk with your health care provider about whether you are at high risk of being infected with HIV. If you choose to begin PrEP, you should first be tested for HIV. You should then be tested every 3 months for as long as you are taking PrEP.  Osteoporosis is a disease in which the bones lose minerals and strength with aging. This can result in serious bone fractures or breaks. The risk of osteoporosis can be identified using a bone density scan. Women ages 65 years and over and women at risk for fractures or osteoporosis should discuss screening with their health care providers. Ask your health care provider whether you should take a calcium supplement or vitamin D to reduce the rate of osteoporosis.  Menopause can be associated with physical symptoms and risks. Hormone replacement therapy is available to decrease symptoms and risks. You should talk to your health care provider about whether hormone replacement therapy is right for you.  Use sunscreen. Apply sunscreen liberally and repeatedly throughout the day. You should seek shade when your shadow is shorter than you. Protect yourself by wearing long sleeves, pants, a wide-brimmed hat, and  sunglasses year round, whenever you are outdoors.  Once a month, do a whole body skin exam, using a mirror to look at the skin on your back. Tell your health care provider of new moles, moles that have irregular borders, moles that are larger than a pencil eraser, or moles that have changed in shape or color.  Stay current with required vaccines (immunizations).  Influenza vaccine. All adults should be immunized every year.  Tetanus, diphtheria, and acellular pertussis (Td, Tdap) vaccine. Pregnant women should receive 1 dose of Tdap vaccine during each pregnancy. The dose should be obtained regardless of the length of time since the last dose. Immunization is preferred during the 27th-36th week of gestation. An adult who has not previously received Tdap or who does not know her vaccine status should receive 1 dose of Tdap. This initial dose should be followed by tetanus and diphtheria toxoids (Td) booster doses every 10 years. Adults with an unknown or incomplete history of completing a 3-dose immunization series with Td-containing vaccines should begin or complete a primary immunization series including a Tdap dose. Adults should receive a Td booster every 10 years.  Varicella vaccine. An adult without evidence of immunity to varicella should receive 2 doses or a second dose if she has previously received 1 dose. Pregnant females who do not have evidence of immunity should receive the first dose after pregnancy. This first dose should be obtained before leaving the health care facility. The second dose should be obtained 4-8 weeks after the first dose.  Human papillomavirus (HPV) vaccine. Females aged 13-26 years who have not received the vaccine previously should obtain the 3-dose series. The vaccine is not recommended for use in pregnant females. However, pregnancy testing is not needed before receiving a dose. If a female is found to be pregnant after receiving a dose, no treatment is needed. In that  case, the remaining doses should be delayed until after the pregnancy. Immunization is recommended for any person with an immunocompromised condition through the age of 26 years if she did not get any or all doses earlier. During the 3-dose series, the second dose should be obtained 4-8 weeks after the first dose. The third dose should be obtained 24 weeks after the first dose   and 16 weeks after the second dose.  Zoster vaccine. One dose is recommended for adults aged 60 years or older unless certain conditions are present.  Measles, mumps, and rubella (MMR) vaccine. Adults born before 1957 generally are considered immune to measles and mumps. Adults born in 1957 or later should have 1 or more doses of MMR vaccine unless there is a contraindication to the vaccine or there is laboratory evidence of immunity to each of the three diseases. A routine second dose of MMR vaccine should be obtained at least 28 days after the first dose for students attending postsecondary schools, health care workers, or international travelers. People who received inactivated measles vaccine or an unknown type of measles vaccine during 1963-1967 should receive 2 doses of MMR vaccine. People who received inactivated mumps vaccine or an unknown type of mumps vaccine before 1979 and are at high risk for mumps infection should consider immunization with 2 doses of MMR vaccine. For females of childbearing age, rubella immunity should be determined. If there is no evidence of immunity, females who are not pregnant should be vaccinated. If there is no evidence of immunity, females who are pregnant should delay immunization until after pregnancy. Unvaccinated health care workers born before 1957 who lack laboratory evidence of measles, mumps, or rubella immunity or laboratory confirmation of disease should consider measles and mumps immunization with 2 doses of MMR vaccine or rubella immunization with 1 dose of MMR vaccine.  Pneumococcal  13-valent conjugate (PCV13) vaccine. When indicated, a person who is uncertain of his immunization history and has no record of immunization should receive the PCV13 vaccine. All adults 65 years of age and older should receive this vaccine. An adult aged 19 years or older who has certain medical conditions and has not been previously immunized should receive 1 dose of PCV13 vaccine. This PCV13 should be followed with a dose of pneumococcal polysaccharide (PPSV23) vaccine. Adults who are at high risk for pneumococcal disease should obtain the PPSV23 vaccine at least 8 weeks after the dose of PCV13 vaccine. Adults older than 65 years of age who have normal immune system function should obtain the PPSV23 vaccine dose at least 1 year after the dose of PCV13 vaccine.  Pneumococcal polysaccharide (PPSV23) vaccine. When PCV13 is also indicated, PCV13 should be obtained first. All adults aged 65 years and older should be immunized. An adult younger than age 65 years who has certain medical conditions should be immunized. Any person who resides in a nursing home or long-term care facility should be immunized. An adult smoker should be immunized. People with an immunocompromised condition and certain other conditions should receive both PCV13 and PPSV23 vaccines. People with human immunodeficiency virus (HIV) infection should be immunized as soon as possible after diagnosis. Immunization during chemotherapy or radiation therapy should be avoided. Routine use of PPSV23 vaccine is not recommended for American Indians, [place] Natives, or people younger than 65 years unless there are medical conditions that require PPSV23 vaccine. When indicated, people who have unknown immunization and have no record of immunization should receive PPSV23 vaccine. One-time revaccination 5 years after the first dose of PPSV23 is recommended for people aged 19-64 years who have chronic kidney failure, nephrotic syndrome, asplenia, or  immunocompromised conditions. People who received 1-2 doses of PPSV23 before age 65 years should receive another dose of PPSV23 vaccine at age 65 years or later if at least 5 years have passed since the previous dose. Doses of PPSV23 are not needed for people   immunized with PPSV23 at or after age 65 years.  Meningococcal vaccine. Adults with asplenia or persistent complement component deficiencies should receive 2 doses of quadrivalent meningococcal conjugate (MenACWY-D) vaccine. The doses should be obtained at least 2 months apart. Microbiologists working with certain meningococcal bacteria, military recruits, people at risk during an outbreak, and people who travel to or live in countries with a high rate of meningitis should be immunized. A first-year college student up through age 21 years who is living in a residence hall should receive a dose if she did not receive a dose on or after her 16th birthday. Adults who have certain high-risk conditions should receive one or more doses of vaccine.  Hepatitis A vaccine. Adults who wish to be protected from this disease, have certain high-risk conditions, work with hepatitis A-infected animals, work in hepatitis A research labs, or travel to or work in countries with a high rate of hepatitis A should be immunized. Adults who were previously unvaccinated and who anticipate close contact with an international adoptee during the first 60 days after arrival in the [place] from a country with a high rate of hepatitis A should be immunized.  Hepatitis B vaccine. Adults who wish to be protected from this disease, have certain high-risk conditions, may be exposed to blood or other infectious body fluids, are household contacts or sex partners of hepatitis B positive people, are clients or workers in certain care facilities, or travel to or work in countries with a high rate of hepatitis B should be immunized.  Haemophilus influenzae type b (Hib) vaccine. A  previously unvaccinated person with asplenia or sickle cell disease or having a scheduled splenectomy should receive 1 dose of Hib vaccine. Regardless of previous immunization, a recipient of a hematopoietic stem cell transplant should receive a 3-dose series 6-12 months after her successful transplant. Hib vaccine is not recommended for adults with HIV infection. Preventive Services / Frequency Ages 19 to 39 years  Blood pressure check.** / Every 3-5 years.  Lipid and cholesterol check.** / Every 5 years beginning at age 20.  Clinical breast exam.** / Every 3 years for women in their 20s and 30s.  BRCA-related cancer risk assessment.** / For women who have family members with a BRCA-related cancer (breast, ovarian, tubal, or peritoneal cancers).  Pap test.** / Every 2 years from ages 21 through 29. Every 3 years starting at age 30 through age 65 or 70 with a history of 3 consecutive normal Pap tests.  HPV screening.** / Every 3 years from ages 30 through ages 65 to 70 with a history of 3 consecutive normal Pap tests.  Hepatitis C blood test.** / For any individual with known risks for hepatitis C.  Skin self-exam. / Monthly.  Influenza vaccine. / Every year.  Tetanus, diphtheria, and acellular pertussis (Tdap, Td) vaccine.** / Consult your health care provider. Pregnant women should receive 1 dose of Tdap vaccine during each pregnancy. 1 dose of Td every 10 years.  Varicella vaccine.** / Consult your health care provider. Pregnant females who do not have evidence of immunity should receive the first dose after pregnancy.  HPV vaccine. / 3 doses over 6 months, if 26 and younger. The vaccine is not recommended for use in pregnant females. However, pregnancy testing is not needed before receiving a dose.  Measles, mumps, rubella (MMR) vaccine.** / You need at least 1 dose of MMR if you were born in 1957 or later. You may also need a 2nd dose.   For females of childbearing age, rubella  immunity should be determined. If there is no evidence of immunity, females who are not pregnant should be vaccinated. If there is no evidence of immunity, females who are pregnant should delay immunization until after pregnancy.  Pneumococcal 13-valent conjugate (PCV13) vaccine.** / Consult your health care provider.  Pneumococcal polysaccharide (PPSV23) vaccine.** / 1 to 2 doses if you smoke cigarettes or if you have certain conditions.  Meningococcal vaccine.** / 1 dose if you are age 19 to 21 years and a first-year college student living in a residence hall, or have one of several medical conditions, you need to get vaccinated against meningococcal disease. You may also need additional booster doses.  Hepatitis A vaccine.** / Consult your health care provider.  Hepatitis B vaccine.** / Consult your health care provider.  Haemophilus influenzae type b (Hib) vaccine.** / Consult your health care provider. Ages 40 to 64 years  Blood pressure check.** / Every year.  Lipid and cholesterol check.** / Every 5 years beginning at age 20 years.  Lung cancer screening. / Every year if you are aged 55-80 years and have a 30-pack-year history of smoking and currently smoke or have quit within the past 15 years. Yearly screening is stopped once you have quit smoking for at least 15 years or develop a health problem that would prevent you from having lung cancer treatment.  Clinical breast exam.** / Every year after age 40 years.  BRCA-related cancer risk assessment.** / For women who have family members with a BRCA-related cancer (breast, ovarian, tubal, or peritoneal cancers).  Mammogram.** / Every year beginning at age 40 years and continuing for as long as you are in good health. Consult with your health care provider.  Pap test.** / Every 3 years starting at age 30 years through age 65 or 70 years with a history of 3 consecutive normal Pap tests.  HPV screening.** / Every 3 years from ages 30  years through ages 65 to 70 years with a history of 3 consecutive normal Pap tests.  Fecal occult blood test (FOBT) of stool. / Every year beginning at age 50 years and continuing until age 75 years. You may not need to do this test if you get a colonoscopy every 10 years.  Flexible sigmoidoscopy or colonoscopy.** / Every 5 years for a flexible sigmoidoscopy or every 10 years for a colonoscopy beginning at age 50 years and continuing until age 75 years.  Hepatitis C blood test.** / For all people born from 1945 through 1965 and any individual with known risks for hepatitis C.  Skin self-exam. / Monthly.  Influenza vaccine. / Every year.  Tetanus, diphtheria, and acellular pertussis (Tdap/Td) vaccine.** / Consult your health care provider. Pregnant women should receive 1 dose of Tdap vaccine during each pregnancy. 1 dose of Td every 10 years.  Varicella vaccine.** / Consult your health care provider. Pregnant females who do not have evidence of immunity should receive the first dose after pregnancy.  Zoster vaccine.** / 1 dose for adults aged 60 years or older.  Measles, mumps, rubella (MMR) vaccine.** / You need at least 1 dose of MMR if you were born in 1957 or later. You may also need a second dose. For females of childbearing age, rubella immunity should be determined. If there is no evidence of immunity, females who are not pregnant should be vaccinated. If there is no evidence of immunity, females who are pregnant should delay immunization until after pregnancy.    Pneumococcal 13-valent conjugate (PCV13) vaccine.** / Consult your health care provider.  Pneumococcal polysaccharide (PPSV23) vaccine.** / 1 to 2 doses if you smoke cigarettes or if you have certain conditions.  Meningococcal vaccine.** / Consult your health care provider.  Hepatitis A vaccine.** / Consult your health care provider.  Hepatitis B vaccine.** / Consult your health care provider.  Haemophilus influenzae type  b (Hib) vaccine.** / Consult your health care provider. Ages 41 years and over  Blood pressure check.** / Every year.  Lipid and cholesterol check.** / Every 5 years beginning at age 45 years.  Lung cancer screening. / Every year if you are aged 4-80 years and have a 30-pack-year history of smoking and currently smoke or have quit within the past 15 years. Yearly screening is stopped once you have quit smoking for at least 15 years or develop a health problem that would prevent you from having lung cancer treatment.  Clinical breast exam.** / Every year after age 61 years.  BRCA-related cancer risk assessment.** / For women who have family members with a BRCA-related cancer (breast, ovarian, tubal, or peritoneal cancers).  Mammogram.** / Every year beginning at age 42 years and continuing for as long as you are in good health. Consult with your health care provider.  Pap test.** / Every 3 years starting at age 39 years through age 60 or 23 years with 3 consecutive normal Pap tests. Testing can be stopped between 65 and 70 years with 3 consecutive normal Pap tests and no abnormal Pap or HPV tests in the past 10 years.  HPV screening.** / Every 3 years from ages 7 years through ages 23 or 38 years with a history of 3 consecutive normal Pap tests. Testing can be stopped between 65 and 70 years with 3 consecutive normal Pap tests and no abnormal Pap or HPV tests in the past 10 years.  Fecal occult blood test (FOBT) of stool. / Every year beginning at age 29 years and continuing until age 45 years. You may not need to do this test if you get a colonoscopy every 10 years.  Flexible sigmoidoscopy or colonoscopy.** / Every 5 years for a flexible sigmoidoscopy or every 10 years for a colonoscopy beginning at age 74 years and continuing until age 39 years.  Hepatitis C blood test.** / For all people born from [birth year] through 1965 and any individual with known risks for hepatitis C.  Osteoporosis  screening.** / A one-time screening for women ages 31 years and over and women at risk for fractures or osteoporosis.  Skin self-exam. / Monthly.  Influenza vaccine. / Every year.  Tetanus, diphtheria, and acellular pertussis (Tdap/Td) vaccine.** / 1 dose of Td every 10 years.  Varicella vaccine.** / Consult your health care provider.  Zoster vaccine.** / 1 dose for adults aged 71 years or older.  Pneumococcal 13-valent conjugate (PCV13) vaccine.** / Consult your health care provider.  Pneumococcal polysaccharide (PPSV23) vaccine.** / 1 dose for all adults aged 47 years and older.  Meningococcal vaccine.** / Consult your health care provider.  Hepatitis A vaccine.** / Consult your health care provider.  Hepatitis B vaccine.** / Consult your health care provider.  Haemophilus influenzae type b (Hib) vaccine.** / Consult your health care provider. ** Family history and personal history of risk and conditions may change your health care provider's recommendations.   This information is not intended to replace advice given to you by your health care provider. Make sure you discuss any questions you have  with your health care provider.   Document Released: 12/14/2001 Document Revised: 11/08/2014 Document Reviewed: 03/15/2011 Elsevier Interactive Patient Education 2016 [employer] Inc[employer]  

## 2015-08-08 NOTE — Progress Notes (Signed)
Pre visit review using our clinic review tool, if applicable. No additional management support is needed unless otherwise documented below in the visit  Patient ID: Priscilla Butler, female    DOB: 17-Dec-1950  Age: 64 y.o. MRN: [id number]    Subjective:  Subjective HPI Priscilla Butler presents for new patient appointment. She is transferring care from practice of the doctor has left practice. Is complaining of right shoulder and right knee pain which is ongoing. No redness or warmth. No trauma. Also notes stiffness and headache over the last 1-2 weeks. Does report a previous history of falling and injuring her rotator cuff. Has recently been treated for UTI with ciprofloxacin with good results. Denies CP/palp/SOB/HA/congestion/fevers/GI or GU c/o. Taking meds as prescribed  Review of Systems  History Past Medical History  Diagnosis Date  . Hyperglycemia   . Menopause   . Osteopenia   . DeQuervain's disease (tenosynovitis)   . Breast cancer ([place]) 01/2008    Stage I, right breast  . Hyperlipidemia   . History of chicken pox   . H/O measles   . H/O mumps   . Hx of TB skin testing     She has past surgical history that includes Wisdom tooth extraction (1972); Breast lumpectomy (03/2008); Skin surgery; and Mohs surgery (2013).   Her family history includes Arthritis in her mother; Breast cancer in her maternal grandmother; Cancer in her maternal grandfather; Cancer (age of onset: 62) in her maternal grandmother; Hyperlipidemia in her mother; Hypertension in her mother; Other in her paternal grandmother.She reports that she quit smoking about 15 years ago. She has never used smokeless tobacco. She reports that she drinks about 1.8 oz of alcohol per week. She reports that she does not use illicit drugs.  Current Outpatient Prescriptions on File Prior to Visit  Medication Sig Dispense Refill  . aspirin 81 MG tablet Take 81 mg by mouth daily.      . calcium-vitamin D (OSCAL WITH D) 500-200 MG-UNIT per  tablet Take 1 tablet by mouth daily.      . Cholecalciferol (VITAMIN D3) 1000 UNITS CAPS Take 1 capsule by mouth daily.      . Multiple Vitamin (MULTIVITAMIN) tablet Take 1 tablet by mouth daily.      . Omega-3 Fatty Acids (FISH OIL) 1000 MG CAPS Take 1 capsule by mouth 2 (two) times daily.      . ONE TOUCH ULTRA TEST test strip TEST BLOOD SUGAR TWICE A DAY AS DIRECTED 100 each 12  . simvastatin (ZOCOR) 40 MG tablet Take 1 tablet (40 mg total) by mouth daily. 90 tablet 0   No current facility-administered medications on file prior to visit.     Objective:  Objective Physical Exam BP 130/84 mmHg  Pulse 98  Temp(Src) 97.9 F (36.6 C) (Oral)  Ht 5\' 2"  (1.575 m)  Wt 166 lb 8 oz (75.524 kg)  BMI 30.45 kg/m2  SpO2 95% Wt Readings from Last 3 Encounters:  08/14/15 166 lb (75.297 kg)  08/08/15 166 lb 8 oz (75.524 kg)  05/27/15 168 lb 9.6 oz (76.476 kg)     Lab Results  Component Value Date   WBC 5.4 11/27/2014   HGB 13.5 11/27/2014   HCT 40.6 11/27/2014   PLT 267 11/27/2014   GLUCOSE 100* 12/04/2014   CHOL 212* 11/27/2014   TRIG 90 11/27/2014   HDL 58 11/27/2014   LDLCALC 136* 11/27/2014   ALT 30 12/04/2014   AST 22 12/04/2014   NA 139  12/04/2014   K 4.2 12/04/2014   CL 100 12/04/2014   CREATININE 0.72 12/04/2014   BUN 19 12/04/2014   CO2 27 12/04/2014   TSH 1.985 11/27/2014   HGBA1C 6.4* 06/05/2014    Dg Cervical Spine Complete  08/08/2015  CLINICAL DATA:  Right-sided neck and shoulder pain, no injury EXAM: CERVICAL SPINE  4+ VIEWS COMPARISON:  None. FINDINGS: The cervical vertebrae are straightened in alignment. There is degenerative disc disease at C5-6 and C6-7 where there is loss of disc space and sclerosis with spurring. No prevertebral soft tissue swelling is seen. There may be mild degenerative disc disease at C4-5 as well. On oblique views there is some foraminal narrowing at C4-5 and C5-6 levels. The odontoid process is intact. The lung apices are clear.  IMPRESSION: Straightened alignment with degenerative disc disease at C5-6 and C6-7 with minimal degenerative change at C4-5. Electronically Signed   By: Ivar Drape M.D.   On: 08/08/2015 16:18     Assessment & Plan:  Plan I am having [name] maintain her multivitamin, calcium-vitamin D, Vitamin D3, aspirin, Fish Oil, ONE TOUCH ULTRA TEST, and simvastatin.  No orders of the defined types were placed in this encounter.    Problem List Items Addressed This Visit    Pain in joint of right shoulder    With right shoulder pian. Some help with topical treatments. Encouraged moist heat and gentle stretching as tolerated. May try NSAIDs and prescription meds as directed and report if symptoms worsen or seek immediate care      Relevant Orders   DG Cervical Spine Complete (Completed)   Ambulatory referral to Sports Medicine   TSH   CBC   Comprehensive metabolic panel   Lipid panel   Neck pain on right side   Relevant Orders   DG Cervical Spine Complete (Completed)   Ambulatory referral to Sports Medicine   TSH   CBC   Comprehensive metabolic panel   Lipid panel   Hyperlipidemia    Tolerating statin, encouraged heart healthy diet, avoid trans fats, minimize simple carbs and saturated fats. Increase exercise as tolerated      Relevant Orders   TSH   CBC   Comprehensive metabolic panel   Lipid panel   Hyperglycemia    hgba1c acceptable, minimize simple carbs. Increase exercise as tolerated.      HX: breast cancer - Primary   Relevant Orders   TSH   CBC   Comprehensive metabolic panel   Lipid panel   Hx of TB skin testing    H/o TB as child, hospitalized at age 83       Relevant Orders   TSH   CBC   Comprehensive metabolic panel   Lipid panel   Elevated BP    Well controlled Encouraged heart healthy diet such as the DASH diet and exercise as tolerated.       Relevant Orders   TSH   CBC   Comprehensive metabolic panel   Lipid panel     Referred to sports med to  manage painful hip. Follow-up: Return in about 5 months (around 01/09/2016) for annual exam.  Penni Homans, MD

## 2015-08-08 NOTE — Assessment & Plan Note (Signed)
H/o TB as child, hospitalized at age 64

## 2015-08-14 ENCOUNTER — Ambulatory Visit (INDEPENDENT_AMBULATORY_CARE_PROVIDER_SITE_OTHER): Payer: 59 | Admitting: Family Medicine

## 2015-08-14 ENCOUNTER — Other Ambulatory Visit (INDEPENDENT_AMBULATORY_CARE_PROVIDER_SITE_OTHER): Payer: 59

## 2015-08-14 ENCOUNTER — Encounter: Payer: Self-pay | Admitting: Family Medicine

## 2015-08-14 VITALS — BP 124/74 | Ht 62.0 in | Wt 166.0 lb

## 2015-08-14 DIAGNOSIS — M25511 Pain in right shoulder: Secondary | ICD-10-CM

## 2015-08-14 DIAGNOSIS — M129 Arthropathy, unspecified: Secondary | ICD-10-CM | POA: Diagnosis not present

## 2015-08-14 DIAGNOSIS — M7551 Bursitis of right shoulder: Secondary | ICD-10-CM | POA: Diagnosis not present

## 2015-08-14 DIAGNOSIS — M5030 Other cervical disc degeneration, unspecified cervical region: Secondary | ICD-10-CM

## 2015-08-14 DIAGNOSIS — M7550 Bursitis of unspecified shoulder: Secondary | ICD-10-CM | POA: Insufficient documentation

## 2015-08-14 DIAGNOSIS — M19019 Primary osteoarthritis, unspecified shoulder: Secondary | ICD-10-CM | POA: Insufficient documentation

## 2015-08-14 MED ORDER — MELOXICAM 15 MG PO TABS: 15.0000 mg | ORAL_TABLET | Freq: Every day | ORAL | Status: DC

## 2015-08-14 NOTE — Assessment & Plan Note (Signed)
Severe acromion clavicular arthritis likely contributing to most of her discomfort. We discussed proper lifting mechanics and decreasing the weight. We discussed topical anti-inflammatory, icing, over-the-counter natural supplementations. Patient did do a short course of meloxicam for a very mild subacromial bursitis also noted. Patient also given postural exercises. Patient come back and see me again in 4 weeks for further evaluation. If having any worsening symptoms I would consider doing an injection in the acromial clavicular joint and possibly an intra-articular injection of the right shoulder as well.

## 2015-08-14 NOTE — Patient Instructions (Signed)
Good to see you.  Ice 20 minutes 2 times daily. Usually after activity and before bed. Exercises 3 times a week.  Keep hands within peripheral vision with lifting On wall with heels, butt shoulder and head touching for a goal of 5 minutes daily  Monitor at eye level Tennis ball between shoulder blades with sitting.  meloxicam daily for 10 days then as needed Take tylenol 650 mg three times a day is the best evidence based medicine we have for arthritis.  Vitamin D 2000 IU daily Fish oil 2 grams daily.  Tumeric 500mg  twice daily.  Come back and see me in 4 weeks.

## 2015-08-14 NOTE — Progress Notes (Signed)
[facility] [place], [place] [address] Phone: [phone] Subjective:    I'm seeing this patient by the request  of:  BLYTH, Erline Levine, MD   CC: Neck and shoulder pain  DJS:[id number] Priscilla Butler is a 64 y.o. female coming in with complaint of neck and shoulder pain. Patient does have a past mental history significant for degenerative disc disease of C5-C7. These were reviewed by x-ray and I reviewed them by me today. Patient states the neck pain started before the shoulder pain. Patient though states that now that she's working on a more regular basis she hasn't notice of discomfort in her shoulder. Nothing that stops her from activity. Would rate the severity of 4 out of 10. Not keeping her up at night and denies any radiation down the arm. States though that certain movements can give her some difficulty. Patient has taken anti-inflammatories which has been helpful. No clicking popping or giving out on her. Denies any fever, chills, or any abnormal weight loss.     Past Medical History  Diagnosis Date  . Hyperglycemia   . Menopause   . Osteopenia   . DeQuervain's disease (tenosynovitis)   . Breast cancer ([facility]) 01/2008    Stage I, right breast  . Hyperlipidemia   . History of chicken pox   . H/O measles   . H/O mumps   . Hx of TB skin testing    Past Surgical History  Procedure Laterality Date  . Wisdom tooth extraction  1972  . Breast lumpectomy  03/2008    Right breast  . Skin surgery      facial  skin cancer  . Mohs surgery  2013    [place] right upper lip, [place] skin surgery   Social History  Substance Use Topics  . Smoking status: Former Smoker    Quit date: 11/02/1999  . Smokeless tobacco: Never Used  . Alcohol Use: 1.8 oz/week    3 Cans of beer per week   No Known Allergies Family History  Problem Relation Age of Onset  . Breast cancer Maternal Grandmother   . Cancer Maternal Grandmother 36    ish  . Cancer Maternal  Grandfather     lung  . Hypertension Mother   . Hyperlipidemia Mother   . Arthritis Mother   . Other Paternal Grandmother      Past medical history, social, surgical and family history all reviewed in electronic medical record.   Review of Systems: No headache, visual changes, nausea, vomiting, diarrhea, constipation, dizziness, abdominal pain, skin rash, fevers, chills, night sweats, weight loss, swollen lymph nodes, body aches, joint swelling, muscle aches, chest pain, shortness of breath, mood changes.   Objective Blood pressure 124/74, height 5\' 2"  (1.575 m), weight 166 lb (75.297 kg).  General: No apparent distress alert and oriented x3 mood and affect normal, dressed appropriately.  HEENT: Pupils equal, extraocular movements intact  Respiratory: Patient's speak in full sentences and does not appear short of breath  Cardiovascular: No lower extremity edema, non tender, no erythema  Skin: Warm dry intact with no signs of infection or rash on extremities or on axial skeleton.  Abdomen: Soft nontender  Neuro: Cranial nerves II through XII are intact, neurovascularly intact in all extremities with 2+ DTRs and 2+ pulses.  Lymph: No lymphadenopathy of posterior or anterior cervical chain or axillae bilaterally.  Gait normal with good balance and coordination.  MSK:  Non tender with full range of  motion and good stability and symmetric strength and tone of shoulders, elbows, wrist, hip, knee and ankles bilaterally.  Neck: Inspection unremarkable. No palpable stepoffs. Negative Spurling's maneuver. Decreased range of motion lacking the last 10 of right-sided rotation and 5 of flexion. Grip strength and sensation normal in bilateral hands Strength good C4 to T1 distribution No sensory change to C4 to T1 Negative Hoffman sign bilaterally Reflexes normal  Shoulder: Right Inspection reveals no abnormalities, atrophy or asymmetry. Tender over the acromioclavicular joint ROM is full  in all planes passively. Rotator cuff strength normal throughout. signs of impingement with positive Neer and Hawkin's tests, but negative empty can sign. Speeds and Yergason's tests normal. No labral pathology noted with negative Obrien's, negative clunk and good stability. Normal scapular function observed. No painful arc and no drop arm sign. No apprehension sign Contralateral shoulder unremarkable  MSK US performed of: Right This study was ordered, performed, and interpreted by Charlann Boxer D.O.  Shoulder:   Supraspinatus:  Appears normal on long and transverse views, Bursal bulge seen with shoulder abduction on impingement view. Infraspinatus:  Appears normal on long and transverse views. Significant increase in Doppler flow Subscapularis:  Appears normal on long and transverse views. Teres Minor:  Appears normal on long and transverse views. AC joint:  Severe arthritis with positive mushroom side Glenohumeral Joint:  Appears normal without effusion. Mild arthritis Glenoid Labrum:  Intact without visualized tears. Biceps Tendon:  Appears normal on long and transverse views, no fraying of tendon, tendon located in intertubercular groove, no subluxation with shoulder internal or external rotation.  Impression: Mild Subacromial bursitis, severe osteoarthritic changes of the acromial clavicular joint.  Procedure note 35465; 15 minutes spent for Therapeutic exercises as stated in above notes.  This included exercises focusing on stretching, strengthening, with significant focus on eccentric aspects. Shoulder Exercises that included:  Basic scapular stabilization to include adduction and depression of scapula Scaption, focusing on proper movement and good control Internal and External rotation utilizing a theraband, with elbow tucked at side entire time Rows with theraband  Proper technique shown and discussed handout in great detail with ATC.  All questions were discussed and answered.      Impression and Recommendations:     This case required medical decision making of moderate complexity.

## 2015-08-14 NOTE — Progress Notes (Signed)
Pre visit review using our clinic review tool, if applicable. No additional management support is needed unless otherwise documented below in the visit note. 

## 2015-08-14 NOTE — Assessment & Plan Note (Signed)
Patient does have degenerative disc disease. This could be contributing but no signs of radicular symptoms today. We discussed over-the-counter natural supplementations, postural control exercises as well as ergonomics at the day. Patient try to make these changes and come back and see me again in 4 weeks.

## 2015-08-16 DIAGNOSIS — M542 Cervicalgia: Secondary | ICD-10-CM | POA: Insufficient documentation

## 2015-08-16 DIAGNOSIS — I10 Essential (primary) hypertension: Secondary | ICD-10-CM | POA: Insufficient documentation

## 2015-08-16 DIAGNOSIS — M25511 Pain in right shoulder: Secondary | ICD-10-CM | POA: Insufficient documentation

## 2015-08-16 DIAGNOSIS — R030 Elevated blood-pressure reading, without diagnosis of hypertension: Secondary | ICD-10-CM | POA: Insufficient documentation

## 2015-08-16 NOTE — Assessment & Plan Note (Signed)
hgba1c acceptable, minimize simple carbs. Increase exercise as tolerated.  

## 2015-08-16 NOTE — Assessment & Plan Note (Signed)
Well controlled. Encouraged heart healthy diet such as the DASH diet and exercise as tolerated.  

## 2015-08-16 NOTE — Assessment & Plan Note (Signed)
With right shoulder pian. Some help with topical treatments. Encouraged moist heat and gentle stretching as tolerated. May try NSAIDs and prescription meds as directed and report if symptoms worsen or seek immediate care

## 2015-08-16 NOTE — Assessment & Plan Note (Signed)
Tolerating statin, encouraged heart healthy diet, avoid trans fats, minimize simple carbs and saturated fats. Increase exercise as tolerated 

## 2015-08-29 ENCOUNTER — Telehealth: Payer: Self-pay | Admitting: Family Medicine

## 2015-08-29 DIAGNOSIS — E785 Hyperlipidemia, unspecified: Secondary | ICD-10-CM

## 2015-08-29 MED ORDER — SIMVASTATIN 40 MG PO TABS: 40.0000 mg | ORAL_TABLET | Freq: Every day | ORAL | Status: DC

## 2015-08-29 NOTE — Telephone Encounter (Signed)
Called the patient informed simvastatin refill done to [facility]

## 2015-08-29 NOTE — Telephone Encounter (Signed)
error:[id number] ° °

## 2015-08-29 NOTE — Telephone Encounter (Signed)
Relation to VN:[facility] Call back [place]: [place], [place]  Reason for call:  Patient requesting a refill simvastatin (ZOCOR) 40 MG tablet

## 2015-09-09 ENCOUNTER — Telehealth: Payer: Self-pay | Admitting: Family Medicine

## 2015-09-09 NOTE — Telephone Encounter (Signed)
Called the patient informed of Dexa Scan Results. PCP stated Osteopenia still improving.  Continue staying active. Calcium 1200 to 1500 mg daily divided into three servings also calcium in diet. Continue Vitamin D 2000 IU daily. The patient did verbalize understanding of results and instructions.

## 2015-09-11 ENCOUNTER — Encounter: Payer: Self-pay | Admitting: Family Medicine

## 2015-09-11 ENCOUNTER — Ambulatory Visit (INDEPENDENT_AMBULATORY_CARE_PROVIDER_SITE_OTHER): Payer: 59 | Admitting: Family Medicine

## 2015-09-11 VITALS — BP 130/76 | HR 96 | Ht 62.0 in | Wt 167.0 lb

## 2015-09-11 DIAGNOSIS — M5030 Other cervical disc degeneration, unspecified cervical region: Secondary | ICD-10-CM | POA: Diagnosis not present

## 2015-09-11 NOTE — Assessment & Plan Note (Signed)
Patient did have cervical degenerative disc disease seen on x-ray previously. Patient encouraged to do more of upper back strengthening, postural training, topical anti-inflammatory's, and we discussed medications over-the-counter that could be beneficial. We discussed that if patient does not make significant improvement advance imaging as well as possible epidural injections could be needed in the long run. Encourage her to keep working on her core strength. We discussed ergonomics throughout the day. Patient will come back and see me again in 4-6 weeks for further evaluation and treatment.

## 2015-09-11 NOTE — Progress Notes (Signed)
[name] [name], [place] [address] Phone: [phone] Subjective:    I'm seeing this patient by the request  of:  BLYTH, Erline Levine, MD   CC: Neck and shoulder pain follow up  [id number] Priscilla Butler is a 64 y.o. female coming in with complaint of neck and shoulder pain. Patient does have a past mental history significant for degenerative disc disease of C5-C7.  Patient was seen previously and did have more of a subacromial bursitis as well as a acromioclavicular severe arthritis. Patient was to to try conservative therapy with oral anti-inflammatories, icing, home exercises. Patient states she is having more neck discomfort. Patient states that the shoulder is significantly better at this time. States that her shoulder is on a percent better. More of a dull, throbbing aching sensation in the neck itself. Certain motions and sitting in one position for a long amount of time seems to exacerbate.  Patient denies any radiation or numbness down the arm. States though that she does get a sharp pain in her forearm with certain motions. Noticed it while she was in her spinning class. Tries to stay very active. Rates of 5 out of 10 for the pain. Nothing that is stopping her from activities and the pain only lasts seconds when it does occur.     Past Medical History  Diagnosis Date  . Hyperglycemia   . Menopause   . Osteopenia   . DeQuervain's disease (tenosynovitis)   . Breast cancer ([place]) 01/2008    Stage I, right breast  . Hyperlipidemia   . History of chicken pox   . H/O measles   . H/O mumps   . Hx of TB skin testing    Past Surgical History  Procedure Laterality Date  . Wisdom tooth extraction  1972  . Breast lumpectomy  03/2008    Right breast  . Skin surgery      facial  skin cancer  . Mohs surgery  2013    [place] right upper lip,  skin surgery   Social History  Substance Use Topics  . Smoking status: Former Smoker   Quit date: 11/02/1999  . Smokeless tobacco: Never Used  . Alcohol Use: 1.8 oz/week    3 Cans of beer per week   No Known Allergies Family History  Problem Relation Age of Onset  . Breast cancer Maternal Grandmother   . Cancer Maternal Grandmother 100    ish  . Cancer Maternal Grandfather     lung  . Hypertension Mother   . Hyperlipidemia Mother   . Arthritis Mother   . Other Paternal Grandmother      Past medical history, social, surgical and family history all reviewed in electronic medical record.   Review of Systems: No headache, visual changes, nausea, vomiting, diarrhea, constipation, dizziness, abdominal pain, skin rash, fevers, chills, night sweats, weight loss, swollen lymph nodes, body aches, joint swelling, muscle aches, chest pain, shortness of breath, mood changes.   Objective Blood pressure 130/76, pulse 96, height 5\' 2"  (1.575 m), weight 167 lb (75.751 kg), SpO2 96 %.  General: No apparent distress alert and oriented x3 mood and affect normal, dressed appropriately.  HEENT: Pupils equal, extraocular movements intact  Respiratory: Patient's speak in full sentences and does not appear short of breath  Cardiovascular: No lower extremity edema, non tender, no erythema  Skin: Warm dry intact with no signs of infection or rash on extremities or on axial skeleton.  Abdomen:  Soft nontender  Neuro: Cranial nerves II through XII are intact, neurovascularly intact in all extremities with 2+ DTRs and 2+ pulses.  Lymph: No lymphadenopathy of posterior or anterior cervical chain or axillae bilaterally.  Gait normal with good balance and coordination.  MSK:  Non tender with full range of motion and good stability and symmetric strength and tone of shoulders, elbows, wrist, hip, knee and ankles bilaterally.  Neck: Inspection unremarkable. No palpable stepoffs. Negative Spurling's maneuver. Discussed tenderness on the right side. Decreased range of motion lacking the last 10 of  right-sided rotation and 5 of flexion. No significant change. Grip strength and sensation normal in bilateral hands Strength good C4 to T1 distribution No sensory change to C4 to T1 Negative Hoffman sign bilaterally Reflexes normal  Shoulder: Right Inspection reveals no abnormalities, atrophy or asymmetry. Tender over the acromioclavicular joint ROM is full in all planes passively. Rotator cuff strength normal throughout. Still mild signs of impingement noted Speeds and Yergason's tests normal. No labral pathology noted with negative Obrien's, negative clunk and good stability. Normal scapular function observed. No painful arc and no drop arm sign. No apprehension sign Contralateral shoulder unremarkable   Forearm is unremarkable.     Impression and Recommendations:     This case required medical decision making of moderate complexity.

## 2015-09-11 NOTE — Progress Notes (Signed)
Pre visit review using our clinic review tool, if applicable. No additional management support is needed unless otherwise documented below in the visit note. 

## 2015-09-11 NOTE — Patient Instructions (Signed)
Good to see you.  Ice when you need it Add tart cherry extract at night Vitamin C 500mg  daily seems to help some cartilage  Exercises on wall.  Heel and butt touching.  Raise leg 6 inches and hold 2 seconds.  Down slow for count of 4 seconds.  1 set of 30 reps daily on both sides.  Monitor at eye level.  Work on posture through the day See me again in 4-6 weeks to make sure you are all better.  Happy holidays!

## 2015-09-19 ENCOUNTER — Encounter: Payer: Self-pay | Admitting: Family Medicine

## 2015-10-01 ENCOUNTER — Telehealth: Payer: Self-pay | Admitting: Family Medicine

## 2015-10-01 NOTE — Telephone Encounter (Signed)
Called pt. She says that she had her flu vac at [facility] in November.

## 2015-10-02 NOTE — Telephone Encounter (Signed)
Documented in health maintenance/immunizations.

## 2015-12-08 MED FILL — SIMVASTATIN 40 MG TABLET: 40 | 30 days supply | Qty: 30 | Fill #3

## 2015-12-31 MED FILL — SIMVASTATIN 40 MG TABLET: 40 | 30 days supply | Qty: 30 | Fill #4

## 2016-01-06 ENCOUNTER — Other Ambulatory Visit (INDEPENDENT_AMBULATORY_CARE_PROVIDER_SITE_OTHER): Payer: 59

## 2016-01-06 DIAGNOSIS — M25511 Pain in right shoulder: Secondary | ICD-10-CM | POA: Diagnosis not present

## 2016-01-06 DIAGNOSIS — M542 Cervicalgia: Secondary | ICD-10-CM

## 2016-01-06 DIAGNOSIS — E785 Hyperlipidemia, unspecified: Secondary | ICD-10-CM

## 2016-01-06 DIAGNOSIS — Z9289 Personal history of other medical treatment: Secondary | ICD-10-CM

## 2016-01-06 DIAGNOSIS — Z853 Personal history of malignant neoplasm of breast: Secondary | ICD-10-CM | POA: Diagnosis not present

## 2016-01-06 DIAGNOSIS — R030 Elevated blood-pressure reading, without diagnosis of hypertension: Secondary | ICD-10-CM

## 2016-01-06 DIAGNOSIS — IMO0001 Reserved for inherently not codable concepts without codable children: Secondary | ICD-10-CM

## 2016-01-06 LAB — COMPREHENSIVE METABOLIC PANEL
ALBUMIN: 4.4 g/dL (ref 3.5–5.2)
ALT: 33 U/L (ref 0–35)
AST: 27 U/L (ref 0–37)
Alkaline Phosphatase: 71 U/L (ref 39–117)
BILIRUBIN TOTAL: 1 mg/dL (ref 0.2–1.2)
BUN: 17 mg/dL (ref 6–23)
CO2: 27 meq/L (ref 19–32)
CREATININE: 0.77 mg/dL (ref 0.40–1.20)
Calcium: 9.4 mg/dL (ref 8.4–10.5)
Chloride: 103 mEq/L (ref 96–112)
GFR: 80.05 mL/min (ref >60.00)
Glucose, Bld: 171 mg/dL — ABNORMAL HIGH (ref 70–99)
Potassium: 3.9 mEq/L (ref 3.5–5.1)
Sodium: 139 mEq/L (ref 135–145)
Total Protein: 6.8 g/dL (ref 6.0–8.3)

## 2016-01-06 LAB — CBC
HCT: 40.3 % (ref 36.0–46.0)
Hemoglobin: 13.6 g/dL (ref 12.0–15.0)
MCHC: 33.7 g/dL (ref 30.0–36.0)
MCV: 86 fl (ref 78.0–100.0)
PLATELETS: 272 10*3/uL (ref 150.0–400.0)
RBC: 4.69 Mil/uL (ref 3.87–5.11)
RDW: 12.5 % (ref 11.5–15.5)
WBC: 6.5 10*3/uL (ref 4.0–10.5)

## 2016-01-06 LAB — LIPID PANEL
CHOL/HDL RATIO: 3
Cholesterol: 205 mg/dL — ABNORMAL HIGH (ref 0–200)
HDL: 65.2 mg/dL (ref >39.00)
LDL Cholesterol: 112 mg/dL — ABNORMAL HIGH (ref 0–99)
NonHDL: 139.76
TRIGLYCERIDES: 141 mg/dL (ref 0.0–149.0)
VLDL: 28.2 mg/dL (ref 0.0–40.0)

## 2016-01-06 LAB — TSH: TSH: 2.74 u[IU]/mL (ref 0.35–4.50)

## 2016-01-12 ENCOUNTER — Other Ambulatory Visit: Payer: 59

## 2016-01-19 ENCOUNTER — Ambulatory Visit (INDEPENDENT_AMBULATORY_CARE_PROVIDER_SITE_OTHER): Payer: 59 | Admitting: Family Medicine

## 2016-01-19 ENCOUNTER — Encounter: Payer: Self-pay | Admitting: Family Medicine

## 2016-01-19 VITALS — BP 122/82 | HR 92 | Temp 97.5°F | Ht 62.0 in | Wt 179.2 lb

## 2016-01-19 DIAGNOSIS — E785 Hyperlipidemia, unspecified: Secondary | ICD-10-CM

## 2016-01-19 DIAGNOSIS — Z780 Asymptomatic menopausal state: Secondary | ICD-10-CM | POA: Diagnosis not present

## 2016-01-19 DIAGNOSIS — Z0000 Encounter for general adult medical examination without abnormal findings: Secondary | ICD-10-CM | POA: Diagnosis not present

## 2016-01-19 DIAGNOSIS — M25511 Pain in right shoulder: Secondary | ICD-10-CM

## 2016-01-19 DIAGNOSIS — IMO0001 Reserved for inherently not codable concepts without codable children: Secondary | ICD-10-CM

## 2016-01-19 DIAGNOSIS — R030 Elevated blood-pressure reading, without diagnosis of hypertension: Secondary | ICD-10-CM

## 2016-01-19 DIAGNOSIS — E8889 Other specified metabolic disorders: Secondary | ICD-10-CM

## 2016-01-19 DIAGNOSIS — Z1211 Encounter for screening for malignant neoplasm of colon: Secondary | ICD-10-CM

## 2016-01-19 HISTORY — DX: Other specified metabolic disorders: E88.89

## 2016-01-19 HISTORY — DX: Encounter for general adult medical examination without abnormal findings: Z00.00

## 2016-01-19 MED ORDER — SIMVASTATIN 40 MG PO TABS: 40.0000 mg | ORAL_TABLET | Freq: Every day | ORAL | Status: DC

## 2016-01-19 MED ORDER — GLUCOSE BLOOD VI STRP: ORAL_STRIP | Status: DC

## 2016-01-19 NOTE — Assessment & Plan Note (Signed)
Tolerating statin, encouraged heart healthy diet, avoid trans fats, minimize simple carbs and saturated fats. Increase exercise as tolerated 

## 2016-01-19 NOTE — Assessment & Plan Note (Addendum)
Patient encouraged to maintain heart healthy diet, regular exercise, adequate sleep. Consider daily probiotics. Take medications as prescribed. Allergies verified: NKA  Immunization Status: Flu vaccine-- 08/12/14 Tdap--10/04/11 PNA-- 12/04/12 Shingles-- 12/04/12  A/P:  Changes to FH, PSH or Personal Hx: UTD Pap-- 12/04/14 w/ Dr. Emi Belfast; normal MMG-- 04/24/15 w/ [facility]; bi-rads category 2-benign Bone Density-- 08/06/13 w/ [facility]; Right Femoral Neck (T-score -1.9); follow-up in 2 years. CCS-- patient has not had this procedure completed.  Care Teams Updated: No other providers or specialists to update.  ED/Hospital/Urgent Care Visits: Per the patient, no recent visits to the ED/Hospital or Urgent Care.  To Discuss with Provider: No concerns at the time of call.

## 2016-01-19 NOTE — Progress Notes (Signed)
Subjective:    Patient ID: Priscilla Butler, female    DOB: Oct 26, 1951, 65 y.o.   MRN: [id number]  Chief Complaint  Patient presents with  . Annual Exam    HPI Patient is in today for Annual Physical Exam. She feels well today. She acknowledges she has not been following a heart healthy diet nor has she been exercising regularly. She has just started back into eating well this past week. No recent illness or hospitalization. Denies CP/palp/SOB/HA/congestion/fevers/GI or GU c/o. Taking meds as prescribed  Past Medical History  Diagnosis Date  . Hyperglycemia   . Menopause   . Osteopenia   . DeQuervain's disease (tenosynovitis)   . Breast cancer ([place]) 01/2008    Stage I, right breast  . Hyperlipidemia   . History of chicken pox   . H/O measles   . H/O mumps   . Hx of TB skin testing   . Preventative health care 01/19/2016  . 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 Wisdom tooth extraction  1972  . Breast lumpectomy  03/2008    Right breast  . Skin surgery      facial  skin cancer  . Mohs surgery  2013    [facility] right upper lip, [place] skin surgery    Family History  Problem Relation Age of Onset  . Breast cancer Maternal Grandmother   . Cancer Maternal Grandmother 24    ish  . Cancer Maternal Grandfather     lung  . Hypertension Mother   . Hyperlipidemia Mother   . Arthritis Mother   . Other Paternal Grandmother     Social History   Social History  . Marital Status: Married    Spouse Name: [name]  . Number of Children: 1  . Years of Education: BA   Occupational History  . Retired    Social History Main Topics  . Smoking status: Former Smoker    Quit date: 11/02/1999  . Smokeless tobacco: Never Used  . Alcohol Use: 1.8 oz/week    3 Cans of beer per week  . Drug Use: No  . Sexual Activity:    Partners: Male    Birth Control/ Protection: Post-menopausal     Comment: lives with  husband, retired from [employer], Therapist, music, No dietary restrictions   Other Topics Concern  . Not on file   Social History Narrative    Outpatient Prescriptions Prior to Visit  Medication Sig Dispense Refill  . aspirin 81 MG tablet Take 81 mg by mouth daily.      . Cholecalciferol (VITAMIN D3) 1000 UNITS CAPS Take 1 capsule by mouth 2 (two) times daily.     . Multiple Vitamin (MULTIVITAMIN) tablet Take 1 tablet by mouth daily.      . Omega-3 Fatty Acids (FISH OIL) 1000 MG CAPS Take 1 capsule by mouth 2 (two) times daily.      . calcium-vitamin D (OSCAL WITH D) 500-200 MG-UNIT per tablet Take 1 tablet by mouth 2 (two) times daily.     . meloxicam (MOBIC) 15 MG tablet Take 1 tablet (15 mg total) by mouth daily. 30 tablet 0  . ONE TOUCH ULTRA TEST test strip TEST BLOOD SUGAR TWICE A DAY AS DIRECTED 100 each 12  . simvastatin (ZOCOR) 40 MG tablet Take 1 tablet (40 mg total) by mouth daily. 90 tablet 2   No facility-administered medications prior to visit.  No Known Allergies  Review of Systems  Constitutional: Negative for fever and malaise/fatigue.  HENT: Negative for congestion.   Eyes: Negative for blurred vision.  Respiratory: Negative for shortness of breath.   Cardiovascular: Negative for chest pain, palpitations and leg swelling.  Gastrointestinal: Negative for nausea, abdominal pain and blood in stool.  Genitourinary: Negative for dysuria and frequency.  Musculoskeletal: Negative for falls.  Skin: Negative for rash.  Neurological: Negative for dizziness, loss of consciousness and headaches.  Endo/Heme/Allergies: Negative for environmental allergies.  Psychiatric/Behavioral: Negative for depression. The patient is not nervous/anxious.        Objective:    Physical Exam  Constitutional: She is oriented to person, place, and time. She appears well-developed and well-nourished. No distress.  HENT:  Head: Normocephalic and atraumatic.  Eyes: Conjunctivae are  normal.  Neck: Neck supple. No thyromegaly present.  Cardiovascular: Normal rate, regular rhythm and normal heart sounds.   No murmur heard. Pulmonary/Chest: Effort normal and breath sounds normal. No respiratory distress.  Abdominal: Soft. Bowel sounds are normal. She exhibits no distension and no mass. There is no tenderness.  Musculoskeletal: She exhibits no edema.  Lymphadenopathy:    She has no cervical adenopathy.  Neurological: She is alert and oriented to person, place, and time.  Skin: Skin is warm and dry.  Psychiatric: She has a normal mood and affect. Her behavior is normal.    BP 122/82 mmHg  Pulse 92  Temp(Src) 97.5 F (36.4 C) (Oral)  Ht 5\' 2"  (1.575 m)  Wt 179 lb 4 oz (81.307 kg)  BMI 32.78 kg/m2  SpO2 95% Wt Readings from Last 3 Encounters:  01/19/16 179 lb 4 oz (81.307 kg)  09/11/15 167 lb (75.751 kg)  08/14/15 166 lb (75.297 kg)     Lab Results  Component Value Date   WBC 6.5 01/06/2016   HGB 13.6 01/06/2016   HCT 40.3 01/06/2016   PLT 272.0 01/06/2016   GLUCOSE 171* 01/06/2016   CHOL 205* 01/06/2016   TRIG 141.0 01/06/2016   HDL 65.20 01/06/2016   LDLCALC 112* 01/06/2016   ALT 33 01/06/2016   AST 27 01/06/2016   NA 139 01/06/2016   K 3.9 01/06/2016   CL 103 01/06/2016   CREATININE 0.77 01/06/2016   BUN 17 01/06/2016   CO2 27 01/06/2016   TSH 2.74 01/06/2016   HGBA1C 6.4* 06/05/2014    Lab Results  Component Value Date   TSH 2.74 01/06/2016   Lab Results  Component Value Date   WBC 6.5 01/06/2016   HGB 13.6 01/06/2016   HCT 40.3 01/06/2016   MCV 86.0 01/06/2016   PLT 272.0 01/06/2016   Lab Results  Component Value Date   NA 139 01/06/2016   K 3.9 01/06/2016   CO2 27 01/06/2016   GLUCOSE 171* 01/06/2016   BUN 17 01/06/2016   CREATININE 0.77 01/06/2016   BILITOT 1.0 01/06/2016   ALKPHOS 71 01/06/2016   AST 27 01/06/2016   ALT 33 01/06/2016   PROT 6.8 01/06/2016   ALBUMIN 4.4 01/06/2016   CALCIUM 9.4 01/06/2016   GFR 80.05  01/06/2016   Lab Results  Component Value Date   CHOL 205* 01/06/2016   Lab Results  Component Value Date   HDL 65.20 01/06/2016   Lab Results  Component Value Date   LDLCALC 112* 01/06/2016   Lab Results  Component Value Date   TRIG 141.0 01/06/2016   Lab Results  Component Value Date   CHOLHDL 3 01/06/2016  Lab Results  Component Value Date   HGBA1C 6.4* 06/05/2014       Assessment & Plan:   Problem List Items Addressed This Visit    Elevated BP    Well controlled. Encouraged heart healthy diet such as the DASH diet and exercise as tolerated.       Relevant Orders   VITAMIN D 25 Hydroxy (Vit-D Deficiency, Fractures)   Hemoglobin A1c   Lipid panel   Comprehensive metabolic panel   TSH   CBC   Hyperlipidemia    Tolerating statin, encouraged heart healthy diet, avoid trans fats, minimize simple carbs and saturated fats. Increase exercise as tolerated      Relevant Medications   simvastatin (ZOCOR) 40 MG tablet   Other Relevant Orders   VITAMIN D 25 Hydroxy (Vit-D Deficiency, Fractures)   Hemoglobin A1c   Lipid panel   Comprehensive metabolic panel   TSH   CBC   Menopause   Relevant Orders   VITAMIN D 25 Hydroxy (Vit-D Deficiency, Fractures)   Hemoglobin A1c   Lipid panel   Comprehensive metabolic panel   TSH   CBC   Pain in joint of right shoulder   Relevant Orders   VITAMIN D 25 Hydroxy (Vit-D Deficiency, Fractures)   Hemoglobin A1c   Lipid panel   Comprehensive metabolic panel   TSH   CBC   Preventative health care - Primary    Patient encouraged to maintain heart healthy diet, regular exercise, adequate sleep. Consider daily probiotics. Take medications as prescribed. Allergies verified: NKA  Immunization Status: Flu vaccine-- 08/12/14 Tdap--10/04/11 PNA-- 12/04/12 Shingles-- 12/04/12  A/P:  Changes to FH, PSH or Personal Hx: UTD Pap-- 12/04/14 w/ Dr. Emi Belfast; normal MMG-- 04/24/15 w/ [facility]; bi-rads category  2-benign Bone Density-- 08/06/13 w/ [facility]; Right Femoral Neck (T-score -1.9); follow-up in 2 years. CCS-- patient has not had this procedure completed.  Care Teams Updated: No other providers or specialists to update.  ED/Hospital/Urgent Care Visits: Per the patient, no recent visits to the ED/Hospital or Urgent Care.  To Discuss with Provider: No concerns at the time of call.       Other Visit Diagnoses    Encounter for screening colonoscopy        Relevant Orders    Ambulatory referral to Gastroenterology       I have discontinued Ms. Aldous's calcium-vitamin D and meloxicam. I have also changed her ONE TOUCH ULTRA TEST to glucose blood. Additionally, I am having her maintain her multivitamin, Vitamin D3, aspirin, Fish Oil, TURMERIC PO, CRANBERRY PO, simvastatin, and CALCIUM CITRATE-VITAMIN D PO.  Meds ordered this encounter  Medications  . TURMERIC PO    Sig: Take 450 mg by mouth 2 (two) times daily.  Marland Kitchen CRANBERRY PO    Sig: Take 84 mg by mouth daily.  . simvastatin (ZOCOR) 40 MG tablet    Sig: Take 1 tablet (40 mg total) by mouth daily.    Dispense:  90 tablet    Refill:  2  . glucose blood (ONE TOUCH ULTRA TEST) test strip    Sig: TEST BLOOD SUGAR TWICE A DAY AS DIRECTED DX E11.9    Dispense:  100 each    Refill:  12  . CALCIUM CITRATE-VITAMIN D PO    Sig: Take 1 tablet by mouth once. 600-500 mg     Penni Homans, MD

## 2016-01-19 NOTE — Assessment & Plan Note (Signed)
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

## 2016-01-19 NOTE — Assessment & Plan Note (Signed)
Doing better with diet for past week. minimize simple carbs. Increase exercise as tolerated.

## 2016-01-19 NOTE — Assessment & Plan Note (Signed)
Encouraged to get adequate exercise, calcium and vitamin d intake 

## 2016-01-19 NOTE — Patient Instructions (Signed)
Preventive Care for Adults, Female A healthy lifestyle and preventive care can promote health and wellness. Preventive health guidelines for women include the following key practices.  A routine yearly physical is a good way to check with your health care provider about your health and preventive screening. It is a chance to share any concerns and updates on your health and to receive a thorough exam.  Visit your dentist for a routine exam and preventive care every 6 months. Brush your teeth twice a day and floss once a day. Good oral hygiene prevents tooth decay and gum disease.  The frequency of eye exams is based on your age, health, family medical history, use of contact lenses, and other factors. Follow your health care provider's recommendations for frequency of eye exams.  Eat a healthy diet. Foods like vegetables, fruits, whole grains, low-fat dairy products, and lean protein foods contain the nutrients you need without too many calories. Decrease your intake of foods high in solid fats, added sugars, and salt. Eat the right amount of calories for you.Get information about a proper diet from your health care provider, if necessary.  Regular physical exercise is one of the most important things you can do for your health. Most adults should get at least 150 minutes of moderate-intensity exercise (any activity that increases your heart rate and causes you to sweat) each week. In addition, most adults need muscle-strengthening exercises on 2 or more days a week.  Maintain a healthy weight. The body mass index (BMI) is a screening tool to identify possible weight problems. It provides an estimate of body fat based on height and weight. Your health care provider can find your BMI and can help you achieve or maintain a healthy weight.For adults 20 years and older:  A BMI below 18.5 is considered underweight.  A BMI of 18.5 to 24.9 is normal.  A BMI of 25 to 29.9 is considered overweight.  A  BMI of 30 and above is considered obese.  Maintain normal blood lipids and cholesterol levels by exercising and minimizing your intake of saturated fat. Eat a balanced diet with plenty of fruit and vegetables. Blood tests for lipids and cholesterol should begin at age 45 and be repeated every 5 years. If your lipid or cholesterol levels are high, you are over 50, or you are at high risk for heart disease, you may need your cholesterol levels checked more frequently.Ongoing high lipid and cholesterol levels should be treated with medicines if diet and exercise are not working.  If you smoke, find out from your health care provider how to quit. If you do not use tobacco, do not start.  Lung cancer screening is recommended for adults aged 45-80 years who are at high risk for developing lung cancer because of a history of smoking. A yearly low-dose CT scan of the lungs is recommended for people who have at least a 30-pack-year history of smoking and are a current smoker or have quit within the past 15 years. A pack year of smoking is smoking an average of 1 pack of cigarettes a day for 1 year (for example: 1 pack a day for 30 years or 2 packs a day for 15 years). Yearly screening should continue until the smoker has stopped smoking for at least 15 years. Yearly screening should be stopped for people who develop a health problem that would prevent them from having lung cancer treatment.  If you are pregnant, do not drink alcohol. If you are  breastfeeding, be very cautious about drinking alcohol. If you are not pregnant and choose to drink alcohol, do not have more than 1 drink per day. One drink is considered to be 12 ounces (355 mL) of beer, 5 ounces (148 mL) of wine, or 1.5 ounces (44 mL) of liquor.  Avoid use of street drugs. Do not share needles with anyone. Ask for help if you need support or instructions about stopping the use of drugs.  High blood pressure causes heart disease and increases the risk  of stroke. Your blood pressure should be checked at least every 1 to 2 years. Ongoing high blood pressure should be treated with medicines if weight loss and exercise do not work.  If you are 55-79 years old, ask your health care provider if you should take aspirin to prevent strokes.  Diabetes screening is done by taking a blood sample to check your blood glucose level after you have not eaten for a certain period of time (fasting). If you are not overweight and you do not have risk factors for diabetes, you should be screened once every 3 years starting at age 45. If you are overweight or obese and you are 40-70 years of age, you should be screened for diabetes every year as part of your cardiovascular risk assessment.  Breast cancer screening is essential preventive care for women. You should practice "breast self-awareness." This means understanding the normal appearance and feel of your breasts and may include breast self-examination. Any changes detected, no matter how small, should be reported to a health care provider. Women in their 20s and 30s should have a clinical breast exam (CBE) by a health care provider as part of a regular health exam every 1 to 3 years. After age 40, women should have a CBE every year. Starting at age 40, women should consider having a mammogram (breast X-ray test) every year. Women who have a family history of breast cancer should talk to their health care provider about genetic screening. Women at a high risk of breast cancer should talk to their health care providers about having an MRI and a mammogram every year.  Breast cancer gene (BRCA)-related cancer risk assessment is recommended for women who have family members with BRCA-related cancers. BRCA-related cancers include breast, ovarian, tubal, and peritoneal cancers. Having family members with these cancers may be associated with an increased risk for harmful changes (mutations) in the breast cancer genes BRCA1 and  BRCA2. Results of the assessment will determine the need for genetic counseling and BRCA1 and BRCA2 testing.  Your health care provider may recommend that you be screened regularly for cancer of the pelvic organs (ovaries, uterus, and vagina). This screening involves a pelvic examination, including checking for microscopic changes to the surface of your cervix (Pap test). You may be encouraged to have this screening done every 3 years, beginning at age 21.  For women ages 30-65, health care providers may recommend pelvic exams and Pap testing every 3 years, or they may recommend the Pap and pelvic exam, combined with testing for human papilloma virus (HPV), every 5 years. Some types of HPV increase your risk of cervical cancer. Testing for HPV may also be done on women of any age with unclear Pap test results.  Other health care providers may not recommend any screening for nonpregnant women who are considered low risk for pelvic cancer and who do not have symptoms. Ask your health care provider if a screening pelvic exam is right for   you.  If you have had past treatment for cervical cancer or a condition that could lead to cancer, you need Pap tests and screening for cancer for at least 20 years after your treatment. If Pap tests have been discontinued, your risk factors (such as having a new sexual partner) need to be reassessed to determine if screening should resume. Some women have medical problems that increase the chance of getting cervical cancer. In these cases, your health care provider may recommend more frequent screening and Pap tests.  Colorectal cancer can be detected and often prevented. Most routine colorectal cancer screening begins at the age of 50 years and continues through age 75 years. However, your health care provider may recommend screening at an earlier age if you have risk factors for colon cancer. On a yearly basis, your health care provider may provide home test kits to check  for hidden blood in the stool. Use of a small camera at the end of a tube, to directly examine the colon (sigmoidoscopy or colonoscopy), can detect the earliest forms of colorectal cancer. Talk to your health care provider about this at age 50, when routine screening begins. Direct exam of the colon should be repeated every 5-10 years through age 75 years, unless early forms of precancerous polyps or small growths are found.  People who are at an increased risk for hepatitis B should be screened for this virus. You are considered at high risk for hepatitis B if:  You were born in a country where hepatitis B occurs often. Talk with your health care provider about which countries are considered high risk.  Your parents were born in a high-risk country and you have not received a shot to protect against hepatitis B (hepatitis B vaccine).  You have HIV or AIDS.  You use needles to inject street drugs.  You live with, or have sex with, someone who has hepatitis B.  You get hemodialysis treatment.  You take certain medicines for conditions like cancer, organ transplantation, and autoimmune conditions.  Hepatitis C blood testing is recommended for all people born from 1945 through 1965 and any individual with known risks for hepatitis C.  Practice safe sex. Use condoms and avoid high-risk sexual practices to reduce the spread of sexually transmitted infections (STIs). STIs include gonorrhea, chlamydia, syphilis, trichomonas, herpes, HPV, and human immunodeficiency virus (HIV). Herpes, HIV, and HPV are viral illnesses that have no cure. They can result in disability, cancer, and death.  You should be screened for sexually transmitted illnesses (STIs) including gonorrhea and chlamydia if:  You are sexually active and are younger than 24 years.  You are older than 24 years and your health care provider tells you that you are at risk for this type of infection.  Your sexual activity has changed  since you were last screened and you are at an increased risk for chlamydia or gonorrhea. Ask your health care provider if you are at risk.  If you are at risk of being infected with HIV, it is recommended that you take a prescription medicine daily to prevent HIV infection. This is called preexposure prophylaxis (PrEP). You are considered at risk if:  You are sexually active and do not regularly use condoms or know the HIV status of your partner(s).  You take drugs by injection.  You are sexually active with a partner who has HIV.  Talk with your health care provider about whether you are at high risk of being infected with HIV. If   you choose to begin PrEP, you should first be tested for HIV. You should then be tested every 3 months for as long as you are taking PrEP.  Osteoporosis is a disease in which the bones lose minerals and strength with aging. This can result in serious bone fractures or breaks. The risk of osteoporosis can be identified using a bone density scan. Women ages 67 years and over and women at risk for fractures or osteoporosis should discuss screening with their health care providers. Ask your health care provider whether you should take a calcium supplement or vitamin D to reduce the rate of osteoporosis.  Menopause can be associated with physical symptoms and risks. Hormone replacement therapy is available to decrease symptoms and risks. You should talk to your health care provider about whether hormone replacement therapy is right for you.  Use sunscreen. Apply sunscreen liberally and repeatedly throughout the day. You should seek shade when your shadow is shorter than you. Protect yourself by wearing long sleeves, pants, a wide-brimmed hat, and sunglasses year round, whenever you are outdoors.  Once a month, do a whole body skin exam, using a mirror to look at the skin on your back. Tell your health care provider of new moles, moles that have irregular borders, moles that  are larger than a pencil eraser, or moles that have changed in shape or color.  Stay current with required vaccines (immunizations).  Influenza vaccine. All adults should be immunized every year.  Tetanus, diphtheria, and acellular pertussis (Td, Tdap) vaccine. Pregnant women should receive 1 dose of Tdap vaccine during each pregnancy. The dose should be obtained regardless of the length of time since the last dose. Immunization is preferred during the 27th-36th week of gestation. An adult who has not previously received Tdap or who does not know her vaccine status should receive 1 dose of Tdap. This initial dose should be followed by tetanus and diphtheria toxoids (Td) booster doses every 10 years. Adults with an unknown or incomplete history of completing a 3-dose immunization series with Td-containing vaccines should begin or complete a primary immunization series including a Tdap dose. Adults should receive a Td booster every 10 years.  Varicella vaccine. An adult without evidence of immunity to varicella should receive 2 doses or a second dose if she has previously received 1 dose. Pregnant females who do not have evidence of immunity should receive the first dose after pregnancy. This first dose should be obtained before leaving the health care facility. The second dose should be obtained 4-8 weeks after the first dose.  Human papillomavirus (HPV) vaccine. Females aged 13-26 years who have not received the vaccine previously should obtain the 3-dose series. The vaccine is not recommended for use in pregnant females. However, pregnancy testing is not needed before receiving a dose. If a female is found to be pregnant after receiving a dose, no treatment is needed. In that case, the remaining doses should be delayed until after the pregnancy. Immunization is recommended for any person with an immunocompromised condition through the age of 61 years if she did not get any or all doses earlier. During the  3-dose series, the second dose should be obtained 4-8 weeks after the first dose. The third dose should be obtained 24 weeks after the first dose and 16 weeks after the second dose.  Zoster vaccine. One dose is recommended for adults aged 30 years or older unless certain conditions are present.  Measles, mumps, and rubella (MMR) vaccine. Adults born  before 1957 generally are considered immune to measles and mumps. Adults born in 1957 or later should have 1 or more doses of MMR vaccine unless there is a contraindication to the vaccine or there is laboratory evidence of immunity to each of the three diseases. A routine second dose of MMR vaccine should be obtained at least 28 days after the first dose for students attending postsecondary schools, health care workers, or international travelers. People who received inactivated measles vaccine or an unknown type of measles vaccine during 1963-1967 should receive 2 doses of MMR vaccine. People who received inactivated mumps vaccine or an unknown type of mumps vaccine before 1979 and are at high risk for mumps infection should consider immunization with 2 doses of MMR vaccine. For females of childbearing age, rubella immunity should be determined. If there is no evidence of immunity, females who are not pregnant should be vaccinated. If there is no evidence of immunity, females who are pregnant should delay immunization until after pregnancy. Unvaccinated health care workers born before 1957 who lack laboratory evidence of measles, mumps, or rubella immunity or laboratory confirmation of disease should consider measles and mumps immunization with 2 doses of MMR vaccine or rubella immunization with 1 dose of MMR vaccine.  Pneumococcal 13-valent conjugate (PCV13) vaccine. When indicated, a person who is uncertain of his immunization history and has no record of immunization should receive the PCV13 vaccine. All adults 65 years of age and older should receive this  vaccine. An adult aged 19 years or older who has certain medical conditions and has not been previously immunized should receive 1 dose of PCV13 vaccine. This PCV13 should be followed with a dose of pneumococcal polysaccharide (PPSV23) vaccine. Adults who are at high risk for pneumococcal disease should obtain the PPSV23 vaccine at least 8 weeks after the dose of PCV13 vaccine. Adults older than 65 years of age who have normal immune system function should obtain the PPSV23 vaccine dose at least 1 year after the dose of PCV13 vaccine.  Pneumococcal polysaccharide (PPSV23) vaccine. When PCV13 is also indicated, PCV13 should be obtained first. All adults aged 65 years and older should be immunized. An adult younger than age 65 years who has certain medical conditions should be immunized. Any person who resides in a nursing home or long-term care facility should be immunized. An adult smoker should be immunized. People with an immunocompromised condition and certain other conditions should receive both PCV13 and PPSV23 vaccines. People with human immunodeficiency virus (HIV) infection should be immunized as soon as possible after diagnosis. Immunization during chemotherapy or radiation therapy should be avoided. Routine use of PPSV23 vaccine is not recommended for American Indians, [place] Natives, or people younger than 65 years unless there are medical conditions that require PPSV23 vaccine. When indicated, people who have unknown immunization and have no record of immunization should receive PPSV23 vaccine. One-time revaccination 5 years after the first dose of PPSV23 is recommended for people aged 19-64 years who have chronic kidney failure, nephrotic syndrome, asplenia, or immunocompromised conditions. People who received 1-2 doses of PPSV23 before age 65 years should receive another dose of PPSV23 vaccine at age 65 years or later if at least 5 years have passed since the previous dose. Doses of PPSV23 are not  needed for people immunized with PPSV23 at or after age 65 years.  Meningococcal vaccine. Adults with asplenia or persistent complement component deficiencies should receive 2 doses of quadrivalent meningococcal conjugate (MenACWY-D) vaccine. The doses should be obtained   at least 2 months apart. Microbiologists working with certain meningococcal bacteria, [place] recruits, people at risk during an outbreak, and people who travel to or live in countries with a high rate of meningitis should be immunized. A first-year college student up through age 34 years who is living in a residence hall should receive a dose if she did not receive a dose on or after her 16th birthday. Adults who have certain high-risk conditions should receive one or more doses of vaccine.  Hepatitis A vaccine. Adults who wish to be protected from this disease, have certain high-risk conditions, work with hepatitis A-infected animals, work in hepatitis A research labs, or travel to or work in countries with a high rate of hepatitis A should be immunized. Adults who were previously unvaccinated and who anticipate close contact with an international adoptee during the first 60 days after arrival in the [place] from a country with a high rate of hepatitis A should be immunized.  Hepatitis B vaccine. Adults who wish to be protected from this disease, have certain high-risk conditions, may be exposed to blood or other infectious body fluids, are household contacts or sex partners of hepatitis B positive people, are clients or workers in certain care facilities, or travel to or work in countries with a high rate of hepatitis B should be immunized.  Haemophilus influenzae type b (Hib) vaccine. A previously unvaccinated person with asplenia or sickle cell disease or having a scheduled splenectomy should receive 1 dose of Hib vaccine. Regardless of previous immunization, a recipient of a hematopoietic stem cell transplant should receive a  3-dose series 6-12 months after her successful transplant. Hib vaccine is not recommended for adults with HIV infection. Preventive Services / Frequency Ages 35 to 4 years  Blood pressure check.** / Every 3-5 years.  Lipid and cholesterol check.** / Every 5 years beginning at age 60.  Clinical breast exam.** / Every 3 years for women in their 71s and 10s.  BRCA-related cancer risk assessment.** / For women who have family members with a BRCA-related cancer (breast, ovarian, tubal, or peritoneal cancers).  Pap test.** / Every 2 years from ages 76 through 26. Every 3 years starting at age 61 through age 76 or 93 with a history of 3 consecutive normal Pap tests.  HPV screening.** / Every 3 years from ages 37 through ages 60 to 51 with a history of 3 consecutive normal Pap tests.  Hepatitis C blood test.** / For any individual with known risks for hepatitis C.  Skin self-exam. / Monthly.  Influenza vaccine. / Every year.  Tetanus, diphtheria, and acellular pertussis (Tdap, Td) vaccine.** / Consult your health care provider. Pregnant women should receive 1 dose of Tdap vaccine during each pregnancy. 1 dose of Td every 10 years.  Varicella vaccine.** / Consult your health care provider. Pregnant females who do not have evidence of immunity should receive the first dose after pregnancy.  HPV vaccine. / 3 doses over 6 months, if 93 and younger. The vaccine is not recommended for use in pregnant females. However, pregnancy testing is not needed before receiving a dose.  Measles, mumps, rubella (MMR) vaccine.** / You need at least 1 dose of MMR if you were born in 1957 or later. You may also need a 2nd dose. For females of childbearing age, rubella immunity should be determined. If there is no evidence of immunity, females who are not pregnant should be vaccinated. If there is no evidence of immunity, females who are  pregnant should delay immunization until after pregnancy.  Pneumococcal  13-valent conjugate (PCV13) vaccine.** / Consult your health care provider.  Pneumococcal polysaccharide (PPSV23) vaccine.** / 1 to 2 doses if you smoke cigarettes or if you have certain conditions.  Meningococcal vaccine.** / 1 dose if you are age 68 to 8 years and a Market researcher living in a residence hall, or have one of several medical conditions, you need to get vaccinated against meningococcal disease. You may also need additional booster doses.  Hepatitis A vaccine.** / Consult your health care provider.  Hepatitis B vaccine.** / Consult your health care provider.  Haemophilus influenzae type b (Hib) vaccine.** / Consult your health care provider. Ages 7 to 53 years  Blood pressure check.** / Every year.  Lipid and cholesterol check.** / Every 5 years beginning at age 25 years.  Lung cancer screening. / Every year if you are aged 11-80 years and have a 30-pack-year history of smoking and currently smoke or have quit within the past 15 years. Yearly screening is stopped once you have quit smoking for at least 15 years or develop a health problem that would prevent you from having lung cancer treatment.  Clinical breast exam.** / Every year after age 48 years.  BRCA-related cancer risk assessment.** / For women who have family members with a BRCA-related cancer (breast, ovarian, tubal, or peritoneal cancers).  Mammogram.** / Every year beginning at age 41 years and continuing for as long as you are in good health. Consult with your health care provider.  Pap test.** / Every 3 years starting at age 65 years through age 37 or 70 years with a history of 3 consecutive normal Pap tests.  HPV screening.** / Every 3 years from ages 72 years through ages 60 to 40 years with a history of 3 consecutive normal Pap tests.  Fecal occult blood test (FOBT) of stool. / Every year beginning at age 21 years and continuing until age 5 years. You may not need to do this test if you get  a colonoscopy every 10 years.  Flexible sigmoidoscopy or colonoscopy.** / Every 5 years for a flexible sigmoidoscopy or every 10 years for a colonoscopy beginning at age 35 years and continuing until age 48 years.  Hepatitis C blood test.** / For all people born from [birth year] through 1965 and any individual with known risks for hepatitis C.  Skin self-exam. / Monthly.  Influenza vaccine. / Every year.  Tetanus, diphtheria, and acellular pertussis (Tdap/Td) vaccine.** / Consult your health care provider. Pregnant women should receive 1 dose of Tdap vaccine during each pregnancy. 1 dose of Td every 10 years.  Varicella vaccine.** / Consult your health care provider. Pregnant females who do not have evidence of immunity should receive the first dose after pregnancy.  Zoster vaccine.** / 1 dose for adults aged 30 years or older.  Measles, mumps, rubella (MMR) vaccine.** / You need at least 1 dose of MMR if you were born in 1957 or later. You may also need a second dose. For females of childbearing age, rubella immunity should be determined. If there is no evidence of immunity, females who are not pregnant should be vaccinated. If there is no evidence of immunity, females who are pregnant should delay immunization until after pregnancy.  Pneumococcal 13-valent conjugate (PCV13) vaccine.** / Consult your health care provider.  Pneumococcal polysaccharide (PPSV23) vaccine.** / 1 to 2 doses if you smoke cigarettes or if you have certain conditions.  Meningococcal vaccine.** /  Consult your health care provider.  Hepatitis A vaccine.** / Consult your health care provider.  Hepatitis B vaccine.** / Consult your health care provider.  Haemophilus influenzae type b (Hib) vaccine.** / Consult your health care provider. Ages 64 years and over  Blood pressure check.** / Every year.  Lipid and cholesterol check.** / Every 5 years beginning at age 23 years.  Lung cancer screening. / Every year if you  are aged 16-80 years and have a 30-pack-year history of smoking and currently smoke or have quit within the past 15 years. Yearly screening is stopped once you have quit smoking for at least 15 years or develop a health problem that would prevent you from having lung cancer treatment.  Clinical breast exam.** / Every year after age 74 years.  BRCA-related cancer risk assessment.** / For women who have family members with a BRCA-related cancer (breast, ovarian, tubal, or peritoneal cancers).  Mammogram.** / Every year beginning at age 44 years and continuing for as long as you are in good health. Consult with your health care provider.  Pap test.** / Every 3 years starting at age 58 years through age 22 or 39 years with 3 consecutive normal Pap tests. Testing can be stopped between 65 and 70 years with 3 consecutive normal Pap tests and no abnormal Pap or HPV tests in the past 10 years.  HPV screening.** / Every 3 years from ages 64 years through ages 70 or 61 years with a history of 3 consecutive normal Pap tests. Testing can be stopped between 65 and 70 years with 3 consecutive normal Pap tests and no abnormal Pap or HPV tests in the past 10 years.  Fecal occult blood test (FOBT) of stool. / Every year beginning at age 40 years and continuing until age 27 years. You may not need to do this test if you get a colonoscopy every 10 years.  Flexible sigmoidoscopy or colonoscopy.** / Every 5 years for a flexible sigmoidoscopy or every 10 years for a colonoscopy beginning at age 7 years and continuing until age 32 years.  Hepatitis C blood test.** / For all people born from [birth year] through 1965 and any individual with known risks for hepatitis C.  Osteoporosis screening.** / A one-time screening for women ages 30 years and over and women at risk for fractures or osteoporosis.  Skin self-exam. / Monthly.  Influenza vaccine. / Every year.  Tetanus, diphtheria, and acellular pertussis (Tdap/Td)  vaccine.** / 1 dose of Td every 10 years.  Varicella vaccine.** / Consult your health care provider.  Zoster vaccine.** / 1 dose for adults aged 35 years or older.  Pneumococcal 13-valent conjugate (PCV13) vaccine.** / Consult your health care provider.  Pneumococcal polysaccharide (PPSV23) vaccine.** / 1 dose for all adults aged 46 years and older.  Meningococcal vaccine.** / Consult your health care provider.  Hepatitis A vaccine.** / Consult your health care provider.  Hepatitis B vaccine.** / Consult your health care provider.  Haemophilus influenzae type b (Hib) vaccine.** / Consult your health care provider. ** Family history and personal history of risk and conditions may change your health care provider's recommendations.   This information is not intended to replace advice given to you by your health care provider. Make sure you discuss any questions you have with your health care provider.   Document Released: 12/14/2001 Document Revised: 11/08/2014 Document Reviewed: 03/15/2011 Elsevier Interactive Patient Education [employer].

## 2016-01-19 NOTE — Assessment & Plan Note (Signed)
Improved with Turmeric bid and exercise.

## 2016-01-19 NOTE — Assessment & Plan Note (Signed)
Well controlled. Encouraged heart healthy diet such as the DASH diet and exercise as tolerated.  

## 2016-01-19 NOTE — Progress Notes (Signed)
Pre visit review using our clinic review tool, if applicable. No additional management support is needed unless otherwise documented below in the visit note. 

## 2016-01-28 IMAGING — DX DG CERVICAL SPINE COMPLETE 4+V
7 series · 7 of 7 positions shown · non-contrast
Comparison: None.

CLINICAL DATA: Right-sided neck and shoulder pain, no injury

EXAM:
CERVICAL SPINE  4+ VIEWS

[c-spine lat]
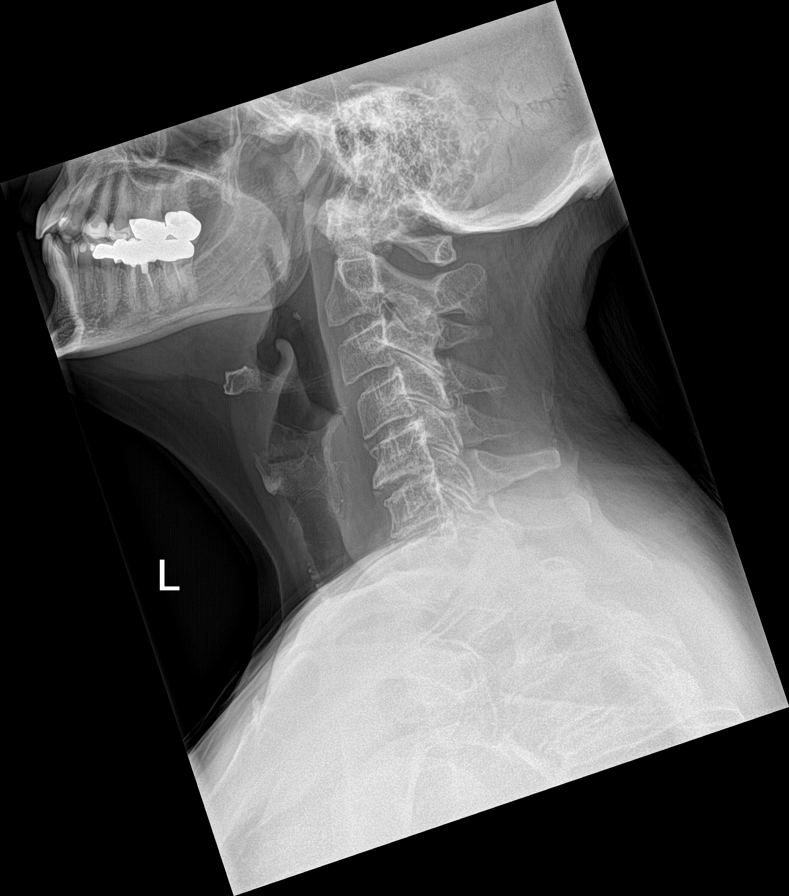

[c-spine obl (1 of 2)]
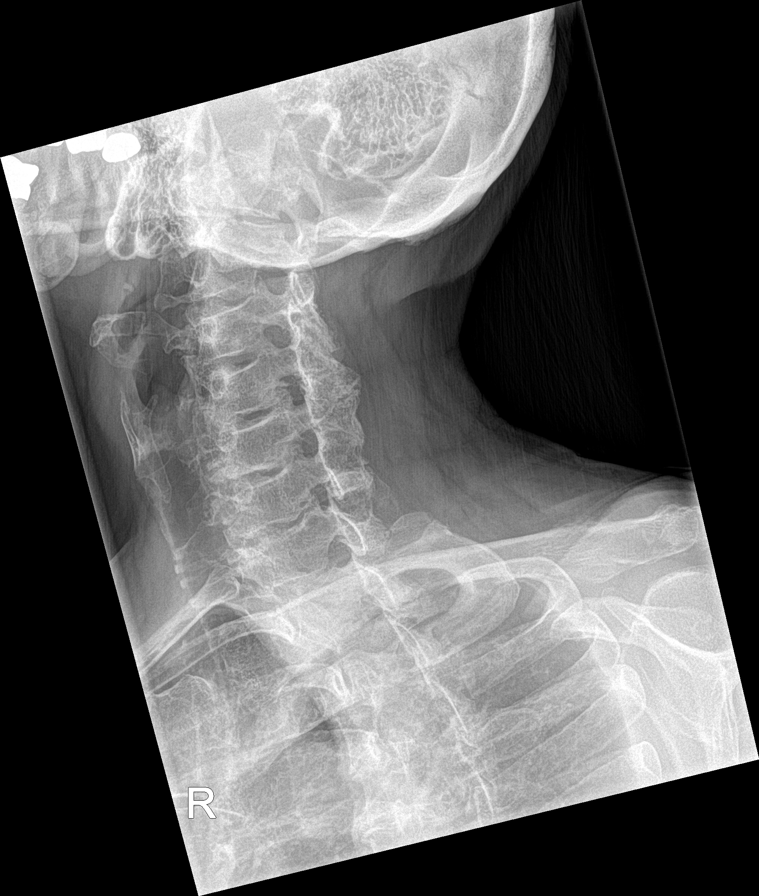

[c-spine ap]
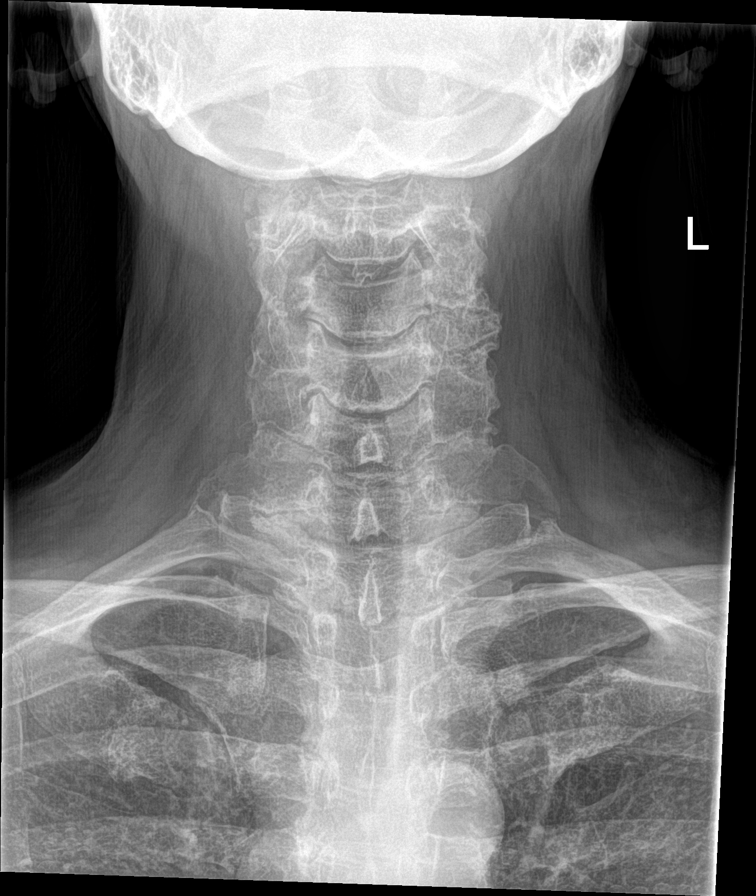

[c-spine open mouth]
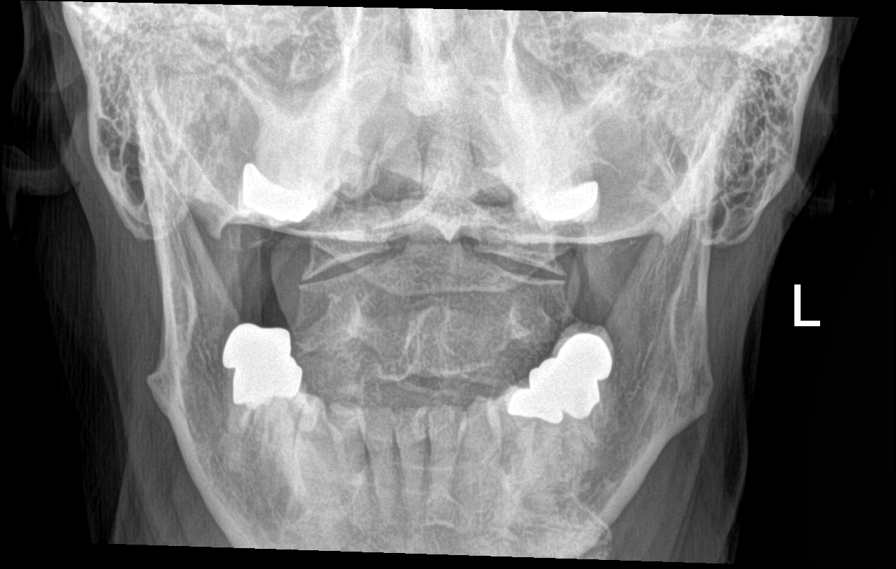

[c-spine swimmers]
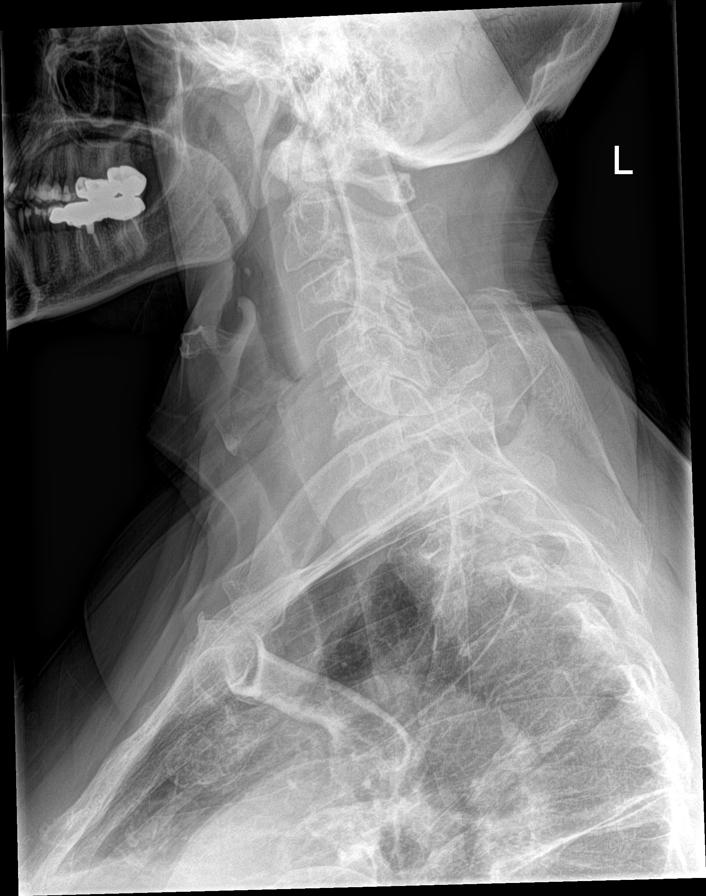

[[person_name]]
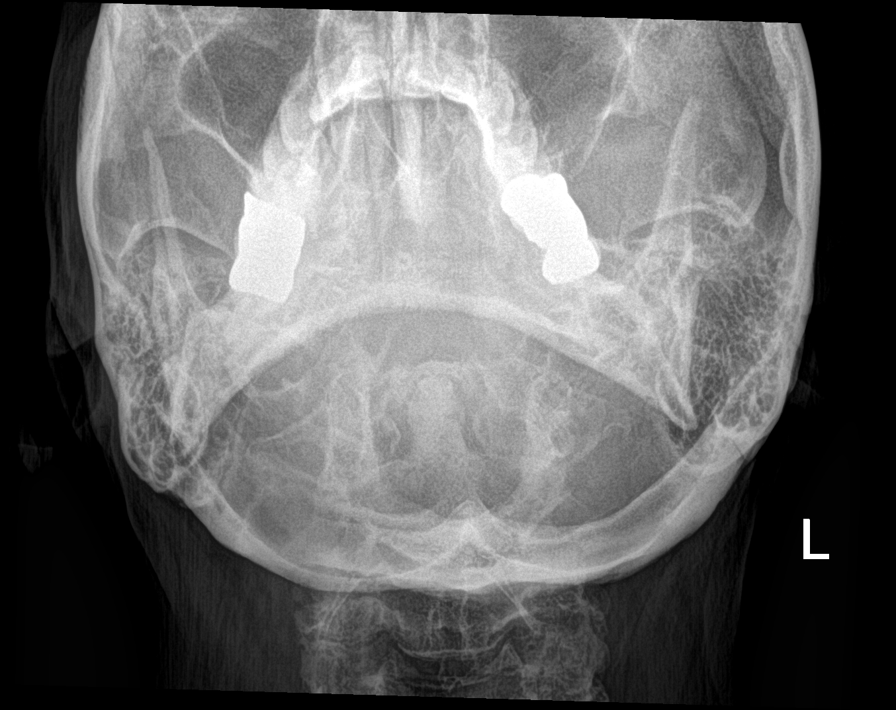

[c-spine obl (2 of 2)]
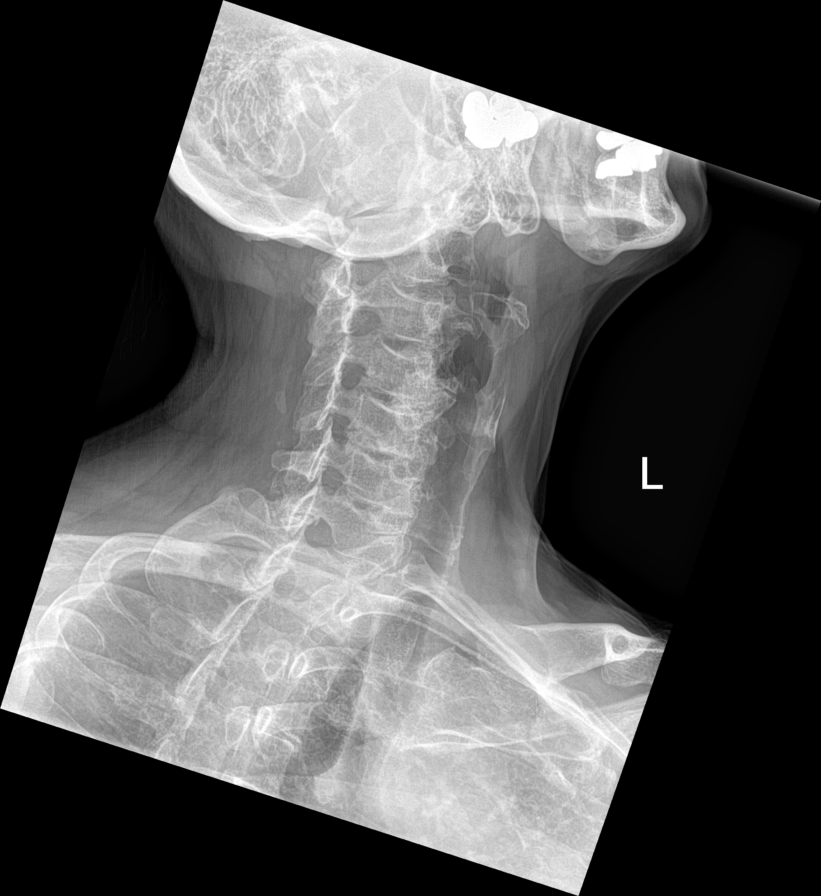

[7 of 7 positions shown; findings below may reference images not displayed]

FINDINGS: The cervical vertebrae are straightened in alignment. There is
degenerative disc disease at C5-6 and C6-7 where there is loss of
disc space and sclerosis with spurring. No prevertebral soft tissue
swelling is seen. There may be mild degenerative disc disease at
C4-5 as well. On oblique views there is some foraminal narrowing at
C4-5 and C5-6 levels. The odontoid process is intact. The lung
apices are clear.
IMPRESSION: Straightened alignment with degenerative disc disease at C5-6 and
C6-7 with minimal degenerative change at C4-5.

## 2016-01-30 MED FILL — SIMVASTATIN 40 MG TABLET: 40 | 30 days supply | Qty: 30 | Fill #5

## 2016-03-17 MED FILL — SIMVASTATIN 40 MG TABLET: 40 | 30 days supply | Qty: 30 | Fill #6

## 2016-04-05 MED FILL — GAVILYTE-N SOLUTION: 420 | 1 days supply | Qty: 4000 | Fill #0

## 2016-04-08 LAB — HM COLONOSCOPY

## 2016-04-16 MED FILL — SIMVASTATIN 40 MG TABLET: 40 | 30 days supply | Qty: 30 | Fill #7

## 2016-04-16 MED FILL — ONE TOUCH ULTRA TEST STRIPS: 25 days supply | Qty: 50 | Fill #0

## 2016-04-20 ENCOUNTER — Encounter: Payer: Self-pay | Admitting: Family Medicine

## 2016-04-20 ENCOUNTER — Other Ambulatory Visit (INDEPENDENT_AMBULATORY_CARE_PROVIDER_SITE_OTHER): Payer: 59

## 2016-04-20 DIAGNOSIS — M25511 Pain in right shoulder: Secondary | ICD-10-CM

## 2016-04-20 DIAGNOSIS — Z780 Asymptomatic menopausal state: Secondary | ICD-10-CM

## 2016-04-20 DIAGNOSIS — E785 Hyperlipidemia, unspecified: Secondary | ICD-10-CM | POA: Diagnosis not present

## 2016-04-20 DIAGNOSIS — IMO0001 Reserved for inherently not codable concepts without codable children: Secondary | ICD-10-CM

## 2016-04-20 DIAGNOSIS — R030 Elevated blood-pressure reading, without diagnosis of hypertension: Secondary | ICD-10-CM

## 2016-04-20 LAB — CBC
HCT: 41.3 % (ref 36.0–46.0)
Hemoglobin: 13.9 g/dL (ref 12.0–15.0)
MCHC: 33.7 g/dL (ref 30.0–36.0)
MCV: 86 fl (ref 78.0–100.0)
Platelets: 268 10*3/uL (ref 150.0–400.0)
RBC: 4.8 Mil/uL (ref 3.87–5.11)
RDW: 12.9 % (ref 11.5–15.5)
WBC: 5.4 10*3/uL (ref 4.0–10.5)

## 2016-04-20 LAB — COMPREHENSIVE METABOLIC PANEL
ALT: 24 U/L (ref 0–35)
AST: 21 U/L (ref 0–37)
Albumin: 4.4 g/dL (ref 3.5–5.2)
Alkaline Phosphatase: 62 U/L (ref 39–117)
BUN: 24 mg/dL — AB (ref 6–23)
CO2: 28 meq/L (ref 19–32)
CREATININE: 0.86 mg/dL (ref 0.40–1.20)
Calcium: 9.5 mg/dL (ref 8.4–10.5)
Chloride: 106 mEq/L (ref 96–112)
GFR: 70.4 mL/min (ref >60.00)
GLUCOSE: 116 mg/dL — AB (ref 70–99)
Potassium: 5 mEq/L (ref 3.5–5.1)
Sodium: 139 mEq/L (ref 135–145)
Total Bilirubin: 0.7 mg/dL (ref 0.2–1.2)
Total Protein: 7.1 g/dL (ref 6.0–8.3)

## 2016-04-20 LAB — TSH: TSH: 2.14 u[IU]/mL (ref 0.35–4.50)

## 2016-04-20 LAB — LIPID PANEL
CHOL/HDL RATIO: 4
Cholesterol: 177 mg/dL (ref 0–200)
HDL: 46.9 mg/dL (ref >39.00)
LDL Cholesterol: 116 mg/dL — ABNORMAL HIGH (ref 0–99)
NONHDL: 130.26
TRIGLYCERIDES: 69 mg/dL (ref 0.0–149.0)
VLDL: 13.8 mg/dL (ref 0.0–40.0)

## 2016-04-20 LAB — VITAMIN D 25 HYDROXY (VIT D DEFICIENCY, FRACTURES): VITD: 72.48 ng/mL (ref 30.00–100.00)

## 2016-04-20 LAB — HEMOGLOBIN A1C: Hgb A1c MFr Bld: 6.7 % — ABNORMAL HIGH (ref 4.6–6.5)

## 2016-05-13 MED FILL — SIMVASTATIN 40 MG TABLET: 40 | 30 days supply | Qty: 30 | Fill #8

## 2016-06-28 MED FILL — SIMVASTATIN 40 MG TABLET: 40 | 30 days supply | Qty: 30 | Fill #0

## 2016-06-29 MED FILL — LATANOPROST 0.005% EYE DRP: 0.005 | 25 days supply | Qty: 3 | Fill #0

## 2016-07-19 ENCOUNTER — Encounter: Payer: Self-pay | Admitting: Family Medicine

## 2016-07-20 ENCOUNTER — Other Ambulatory Visit: Payer: Self-pay | Admitting: Family Medicine

## 2016-07-20 DIAGNOSIS — E119 Type 2 diabetes mellitus without complications: Secondary | ICD-10-CM

## 2016-07-20 DIAGNOSIS — I10 Essential (primary) hypertension: Secondary | ICD-10-CM

## 2016-07-20 DIAGNOSIS — E785 Hyperlipidemia, unspecified: Secondary | ICD-10-CM

## 2016-07-22 ENCOUNTER — Ambulatory Visit (INDEPENDENT_AMBULATORY_CARE_PROVIDER_SITE_OTHER): Payer: 59 | Admitting: Family Medicine

## 2016-07-22 ENCOUNTER — Other Ambulatory Visit (INDEPENDENT_AMBULATORY_CARE_PROVIDER_SITE_OTHER): Payer: 59

## 2016-07-22 VITALS — BP 116/74 | HR 93 | Temp 98.2°F | Wt 164.8 lb

## 2016-07-22 DIAGNOSIS — I10 Essential (primary) hypertension: Secondary | ICD-10-CM | POA: Diagnosis not present

## 2016-07-22 DIAGNOSIS — Z23 Encounter for immunization: Secondary | ICD-10-CM

## 2016-07-22 DIAGNOSIS — E119 Type 2 diabetes mellitus without complications: Secondary | ICD-10-CM

## 2016-07-22 DIAGNOSIS — R030 Elevated blood-pressure reading, without diagnosis of hypertension: Secondary | ICD-10-CM

## 2016-07-22 DIAGNOSIS — E785 Hyperlipidemia, unspecified: Secondary | ICD-10-CM

## 2016-07-22 DIAGNOSIS — H409 Unspecified glaucoma: Secondary | ICD-10-CM

## 2016-07-22 DIAGNOSIS — IMO0001 Reserved for inherently not codable concepts without codable children: Secondary | ICD-10-CM

## 2016-07-22 DIAGNOSIS — M8580 Other specified disorders of bone density and structure, unspecified site: Secondary | ICD-10-CM | POA: Diagnosis not present

## 2016-07-22 DIAGNOSIS — R739 Hyperglycemia, unspecified: Secondary | ICD-10-CM

## 2016-07-22 LAB — CBC WITH DIFFERENTIAL/PLATELET
BASOS ABS: 0.1 10*3/uL (ref 0.0–0.1)
Basophils Relative: 0.9 % (ref 0.0–3.0)
Eosinophils Absolute: 0.3 10*3/uL (ref 0.0–0.7)
Eosinophils Relative: 5.4 % — ABNORMAL HIGH (ref 0.0–5.0)
HCT: 42.7 % (ref 36.0–46.0)
HEMOGLOBIN: 14.3 g/dL (ref 12.0–15.0)
Lymphocytes Relative: 34 % (ref 12.0–46.0)
Lymphs Abs: 2.1 10*3/uL (ref 0.7–4.0)
MCHC: 33.5 g/dL (ref 30.0–36.0)
MCV: 87.2 fl (ref 78.0–100.0)
MONOS PCT: 9.4 % (ref 3.0–12.0)
Monocytes Absolute: 0.6 10*3/uL (ref 0.1–1.0)
NEUTROS PCT: 50.3 % (ref 43.0–77.0)
Neutro Abs: 3 10*3/uL (ref 1.4–7.7)
Platelets: 292 10*3/uL (ref 150.0–400.0)
RBC: 4.9 Mil/uL (ref 3.87–5.11)
RDW: 14.3 % (ref 11.5–15.5)
WBC: 6 10*3/uL (ref 4.0–10.5)

## 2016-07-22 LAB — LIPID PANEL
CHOL/HDL RATIO: 3
Cholesterol: 202 mg/dL — ABNORMAL HIGH (ref 0–200)
HDL: 58.8 mg/dL (ref >39.00)
LDL Cholesterol: 110 mg/dL — ABNORMAL HIGH (ref 0–99)
NONHDL: 142.79
Triglycerides: 164 mg/dL — ABNORMAL HIGH (ref 0.0–149.0)
VLDL: 32.8 mg/dL (ref 0.0–40.0)

## 2016-07-22 LAB — COMPREHENSIVE METABOLIC PANEL
ALBUMIN: 4.2 g/dL (ref 3.5–5.2)
ALK PHOS: 73 U/L (ref 39–117)
ALT: 22 U/L (ref 0–35)
AST: 17 U/L (ref 0–37)
BILIRUBIN TOTAL: 0.4 mg/dL (ref 0.2–1.2)
BUN: 15 mg/dL (ref 6–23)
CO2: 28 mEq/L (ref 19–32)
Calcium: 9 mg/dL (ref 8.4–10.5)
Chloride: 106 mEq/L (ref 96–112)
Creatinine, Ser: 0.81 mg/dL (ref 0.40–1.20)
GFR: 75.38 mL/min (ref >60.00)
GLUCOSE: 133 mg/dL — AB (ref 70–99)
Potassium: 4.3 mEq/L (ref 3.5–5.1)
SODIUM: 140 meq/L (ref 135–145)
TOTAL PROTEIN: 7.3 g/dL (ref 6.0–8.3)

## 2016-07-22 LAB — TSH: TSH: 1.95 u[IU]/mL (ref 0.35–4.50)

## 2016-07-22 LAB — HEMOGLOBIN A1C: HEMOGLOBIN A1C: 6.5 % (ref 4.6–6.5)

## 2016-07-22 MED ORDER — METFORMIN HCL 500 MG PO TABS: 500.0000 mg | ORAL_TABLET | Freq: Every day | ORAL | 1 refills | Status: DC

## 2016-07-22 MED FILL — metFORMIN HCL 500 MG TABS: 500 | 30 days supply | Qty: 30 | Fill #0

## 2016-07-22 MED FILL — LATANOPROST 0.005% EYE DRP: 0.005 | 25 days supply | Qty: 3 | Fill #1

## 2016-07-22 MED FILL — SIMVASTATIN 40 MG TABLET: 40 | 30 days supply | Qty: 30 | Fill #1

## 2016-07-22 NOTE — Patient Instructions (Signed)

## 2016-07-22 NOTE — Progress Notes (Signed)
Pre visit review using our clinic review tool, if applicable. No additional management support is needed unless otherwise documented below in the visit note. 

## 2016-07-31 DIAGNOSIS — H409 Unspecified glaucoma: Secondary | ICD-10-CM | POA: Insufficient documentation

## 2016-07-31 NOTE — Assessment & Plan Note (Addendum)
Encouraged heart healthy diet, increase exercise, avoid trans fats, consider a krill oil cap daily, Simvastatin

## 2016-07-31 NOTE — Assessment & Plan Note (Signed)
Encouraged to get adequate exercise, calcium and vitamin d intake 

## 2016-07-31 NOTE — Progress Notes (Signed)
Patient ID: CURTRINA SUH, female   DOB: 05/09/1951, 65 y.o.   MRN: [id number]   Subjective:    Patient ID: Felipa Furnace, female    DOB: 05-Apr-1951, 65 y.o.   MRN: [id number]  Chief Complaint  Patient presents with  . Follow-up    HPI Patient is in today for follow up. She feels well today. No recent illness or acute concerns. Denies polyuria or polydipsia. Denies CP/palp/SOB/HA/congestion/fevers/GI or GU c/o. Taking meds as prescribed  Past Medical History:  Diagnosis Date  . Breast cancer ([place]) 01/2008   Stage I, right breast  . 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 DeQuervain's disease (tenosynovitis)   . H/O measles   . H/O mumps   . History of chicken pox   . Hx of TB skin testing   . Hyperglycemia   . Hyperlipidemia   . Menopause   . Osteopenia   . Preventative health care 01/19/2016    Past Surgical History:  Procedure Laterality Date  . BREAST LUMPECTOMY  03/2008   Right breast  . MOHS SURGERY  2013   BCC right upper lip, [place] skin surgery  . SKIN SURGERY     facial  skin cancer  . WISDOM TOOTH EXTRACTION  1972    Family History  Problem Relation Age of Onset  . Breast cancer Maternal Grandmother   . Cancer Maternal Grandmother 52    ish  . Cancer Maternal Grandfather     lung  . Hypertension Mother   . Hyperlipidemia Mother   . Arthritis Mother   . Other Paternal Grandmother     Social History   Social History  . Marital status: Married    Spouse name: [name]  . Number of children: 1  . Years of education: BA   Occupational History  . Retired Unemployed   Social History Main Topics  . Smoking status: Former Smoker    Quit date: 11/02/1999  . Smokeless tobacco: Never Used  . Alcohol use 1.8 oz/week    3 Cans of beer per week  . Drug use: No  . Sexual activity: Yes    Partners: Male    Birth control/ protection: Post-menopausal     Comment: lives with husband, retired from Press photographer, Therapist, music, No dietary  restrictions   Other Topics Concern  . Not on file   Social History Narrative  . No narrative on file    Outpatient Medications Prior to Visit  Medication Sig Dispense Refill  . aspirin 81 MG tablet Take 81 mg by mouth daily.      Marland Kitchen CALCIUM CITRATE-VITAMIN D PO Take 1 tablet by mouth once. 600-500 mg    . Cholecalciferol (VITAMIN D3) 1000 UNITS CAPS Take 1 capsule by mouth 2 (two) times daily.     Marland Kitchen CRANBERRY PO Take 84 mg by mouth daily.    Marland Kitchen glucose blood (ONE TOUCH ULTRA TEST) test strip TEST BLOOD SUGAR TWICE A DAY AS DIRECTED DX E11.9 100 each 12  . Multiple Vitamin (MULTIVITAMIN) tablet Take 1 tablet by mouth daily.      . Omega-3 Fatty Acids (FISH OIL) 1000 MG CAPS Take 1 capsule by mouth 2 (two) times daily.      . simvastatin (ZOCOR) 40 MG tablet Take 1 tablet (40 mg total) by mouth daily. 90 tablet 2  . TURMERIC PO Take 450 mg by mouth 2 (two) times daily.     No facility-administered medications prior to visit.  No Known Allergies  Review of Systems  Constitutional: Negative for fever and malaise/fatigue.  HENT: Negative for congestion.   Eyes: Negative for blurred vision.  Respiratory: Negative for shortness of breath.   Cardiovascular: Negative for chest pain, palpitations and leg swelling.  Gastrointestinal: Negative for abdominal pain, blood in stool and nausea.  Genitourinary: Negative for dysuria and frequency.  Musculoskeletal: Negative for falls.  Skin: Negative for rash.  Neurological: Negative for dizziness, loss of consciousness and headaches.  Endo/Heme/Allergies: Negative for environmental allergies.  Psychiatric/Behavioral: Negative for depression. The patient is not nervous/anxious.        Objective:    Physical Exam  Constitutional: She is oriented to person, place, and time. She appears well-developed and well-nourished. No distress.  HENT:  Head: Normocephalic and atraumatic.  Nose: Nose normal.  Eyes: Right eye exhibits no discharge.  Left eye exhibits no discharge.  Neck: Normal range of motion. Neck supple.  Cardiovascular: Normal rate and regular rhythm.   No murmur heard. Pulmonary/Chest: Effort normal and breath sounds normal.  Abdominal: Soft. Bowel sounds are normal. There is no tenderness.  Musculoskeletal: She exhibits no edema.  Neurological: She is alert and oriented to person, place, and time.  Skin: Skin is warm and dry.  Psychiatric: She has a normal mood and affect.  Nursing note and vitals reviewed.   BP 116/74 (BP Location: Left Arm, Patient Position: Sitting, Cuff Size: Normal)   Pulse 93   Temp 98.2 F (36.8 C) (Oral)   Wt 164 lb 12.8 oz (74.8 kg)   SpO2 95%   BMI 30.14 kg/m  Wt Readings from Last 3 Encounters:  07/22/16 164 lb 12.8 oz (74.8 kg)  01/19/16 179 lb 4 oz (81.3 kg)  09/11/15 167 lb (75.8 kg)     Lab Results  Component Value Date   WBC 6.0 07/22/2016   HGB 14.3 07/22/2016   HCT 42.7 07/22/2016   PLT 292.0 07/22/2016   GLUCOSE 133 (H) 07/22/2016   CHOL 202 (H) 07/22/2016   TRIG 164.0 (H) 07/22/2016   HDL 58.80 07/22/2016   LDLCALC 110 (H) 07/22/2016   ALT 22 07/22/2016   AST 17 07/22/2016   NA 140 07/22/2016   K 4.3 07/22/2016   CL 106 07/22/2016   CREATININE 0.81 07/22/2016   BUN 15 07/22/2016   CO2 28 07/22/2016   TSH 1.95 07/22/2016   HGBA1C 6.5 07/22/2016    Lab Results  Component Value Date   TSH 1.95 07/22/2016   Lab Results  Component Value Date   WBC 6.0 07/22/2016   HGB 14.3 07/22/2016   HCT 42.7 07/22/2016   MCV 87.2 07/22/2016   PLT 292.0 07/22/2016   Lab Results  Component Value Date   NA 140 07/22/2016   K 4.3 07/22/2016   CO2 28 07/22/2016   GLUCOSE 133 (H) 07/22/2016   BUN 15 07/22/2016   CREATININE 0.81 07/22/2016   BILITOT 0.4 07/22/2016   ALKPHOS 73 07/22/2016   AST 17 07/22/2016   ALT 22 07/22/2016   PROT 7.3 07/22/2016   ALBUMIN 4.2 07/22/2016   CALCIUM 9.0 07/22/2016   GFR 75.38 07/22/2016   Lab Results  Component  Value Date   CHOL 202 (H) 07/22/2016   Lab Results  Component Value Date   HDL 58.80 07/22/2016   Lab Results  Component Value Date   LDLCALC 110 (H) 07/22/2016   Lab Results  Component Value Date   TRIG 164.0 (H) 07/22/2016   Lab Results  Component  Value Date   CHOLHDL 3 07/22/2016   Lab Results  Component Value Date   HGBA1C 6.5 07/22/2016       Assessment & Plan:   Problem List Items Addressed This Visit    Hyperlipidemia    Encouraged heart healthy diet, increase exercise, avoid trans fats, consider a krill oil cap daily, Simvastatin      Relevant Orders   Lipid panel   Hyperglycemia     minimize simple carbs. Increase exercise as tolerated. Agrees to start Metformin      Relevant Orders   Comprehensive metabolic panel   Hemoglobin A1c   Microalbumin / creatinine urine ratio   Osteopenia    Encouraged to get adequate exercise, calcium and vitamin d intake      Elevated BP - Primary    Well controlled Encouraged heart healthy diet such as the DASH diet and exercise as tolerated.       Relevant Orders   TSH   CBC   Comprehensive metabolic panel    Other Visit Diagnoses    Encounter for immunization       Relevant Orders   Flu vaccine HIGH DOSE PF (Completed)      I am having Ms. Eckenrode start on metFORMIN. I am also having her maintain her multivitamin, Vitamin D3, aspirin, Fish Oil, TURMERIC PO, CRANBERRY PO, simvastatin, glucose blood, and CALCIUM CITRATE-VITAMIN D PO.  Meds ordered this encounter  Medications  . metFORMIN (GLUCOPHAGE) 500 MG tablet    Sig: Take 1 tablet (500 mg total) by mouth daily with breakfast.    Dispense:  90 tablet    Refill:  1     BLYTH, STACEY, MD

## 2016-07-31 NOTE — Assessment & Plan Note (Signed)
Well controlled. Encouraged heart healthy diet such as the DASH diet and exercise as tolerated.  

## 2016-07-31 NOTE — Assessment & Plan Note (Addendum)
minimize simple carbs. Increase exercise as tolerated. Agrees to start Metformin

## 2016-08-17 MED FILL — ONE TOUCH ULTRA TEST STRIPS: 25 days supply | Qty: 50 | Fill #1

## 2016-08-17 MED FILL — LATANOPROST 0.005% EYE DRP: 0.005 | 25 days supply | Qty: 3 | Fill #2

## 2016-08-17 MED FILL — SIMVASTATIN 40 MG TABLET: 40 | 30 days supply | Qty: 30 | Fill #2

## 2016-08-17 MED FILL — metFORMIN HCL 500 MG TABS: 500 | 30 days supply | Qty: 30 | Fill #1

## 2016-08-18 LAB — HM MAMMOGRAPHY

## 2016-08-23 ENCOUNTER — Encounter: Payer: Self-pay | Admitting: Family Medicine

## 2016-09-15 MED FILL — ONE TOUCH ULTRA TEST STRIPS: 25 days supply | Qty: 50 | Fill #2

## 2016-09-15 MED FILL — SIMVASTATIN 40 MG TABLET: 40 | 30 days supply | Qty: 30 | Fill #3

## 2016-09-15 MED FILL — metFORMIN HCL 500 MG TABS: 500 | 30 days supply | Qty: 30 | Fill #2

## 2016-09-15 MED FILL — LATANOPROST 0.005% EYE DRP: 0.005 | 25 days supply | Qty: 3 | Fill #3

## 2016-10-13 MED FILL — LATANOPROST 0.005% EYE DRP: 0.005 | 25 days supply | Qty: 3 | Fill #4

## 2016-10-13 MED FILL — SIMVASTATIN 40 MG TABLET: 40 | 30 days supply | Qty: 30 | Fill #4

## 2016-10-13 MED FILL — metFORMIN HCL 500 MG TABS: 500 | 30 days supply | Qty: 30 | Fill #3

## 2016-11-16 MED FILL — SIMVASTATIN 40 MG TABLET: 40 | 30 days supply | Qty: 30 | Fill #5

## 2016-11-16 MED FILL — LATANOPROST 0.005% EYE DRP: 0.005 | 25 days supply | Qty: 3 | Fill #5

## 2016-11-16 MED FILL — metFORMIN HCL 500 MG TABS: 500 | 30 days supply | Qty: 30 | Fill #4

## 2016-11-29 DIAGNOSIS — H401122 Primary open-angle glaucoma, left eye, moderate stage: Secondary | ICD-10-CM | POA: Diagnosis not present

## 2016-11-29 DIAGNOSIS — H401113 Primary open-angle glaucoma, right eye, severe stage: Secondary | ICD-10-CM | POA: Diagnosis not present

## 2016-12-14 MED FILL — LATANOPROST 0.005% EYE DRP: 0.005 | 25 days supply | Qty: 3 | Fill #6

## 2016-12-14 MED FILL — SIMVASTATIN 40 MG TABLET: 40 | 30 days supply | Qty: 30 | Fill #6

## 2016-12-14 MED FILL — metFORMIN HCL 500 MG TABS: 500 | 30 days supply | Qty: 30 | Fill #5

## 2016-12-14 MED FILL — ONE TOUCH ULTRA TEST STRIPS: 25 days supply | Qty: 50 | Fill #3

## 2017-01-07 ENCOUNTER — Other Ambulatory Visit: Payer: Self-pay | Admitting: Family Medicine

## 2017-01-07 MED FILL — LATANOPROST 0.005% EYE DRP: 0.005 | 25 days supply | Qty: 3 | Fill #7

## 2017-01-07 MED FILL — metFORMIN HCL 500 MG TABS: 500 | 90 days supply | Qty: 90 | Fill #0

## 2017-01-19 DIAGNOSIS — L814 Other melanin hyperpigmentation: Secondary | ICD-10-CM | POA: Diagnosis not present

## 2017-01-19 DIAGNOSIS — D1801 Hemangioma of skin and subcutaneous tissue: Secondary | ICD-10-CM | POA: Diagnosis not present

## 2017-01-19 DIAGNOSIS — L301 Dyshidrosis [pompholyx]: Secondary | ICD-10-CM | POA: Diagnosis not present

## 2017-01-19 DIAGNOSIS — L859 Epidermal thickening, unspecified: Secondary | ICD-10-CM | POA: Diagnosis not present

## 2017-01-19 DIAGNOSIS — L821 Other seborrheic keratosis: Secondary | ICD-10-CM | POA: Diagnosis not present

## 2017-01-19 MED FILL — TRIAMCINOLONE 0.1% CREAM: 0.1 | 30 days supply | Qty: 454 | Fill #0

## 2017-01-25 ENCOUNTER — Other Ambulatory Visit (INDEPENDENT_AMBULATORY_CARE_PROVIDER_SITE_OTHER): Payer: PPO

## 2017-01-25 DIAGNOSIS — R739 Hyperglycemia, unspecified: Secondary | ICD-10-CM

## 2017-01-25 DIAGNOSIS — E785 Hyperlipidemia, unspecified: Secondary | ICD-10-CM | POA: Diagnosis not present

## 2017-01-25 DIAGNOSIS — R030 Elevated blood-pressure reading, without diagnosis of hypertension: Secondary | ICD-10-CM | POA: Diagnosis not present

## 2017-01-25 DIAGNOSIS — IMO0001 Reserved for inherently not codable concepts without codable children: Secondary | ICD-10-CM

## 2017-01-25 LAB — COMPREHENSIVE METABOLIC PANEL
ALBUMIN: 4.6 g/dL (ref 3.5–5.2)
ALT: 25 U/L (ref 0–35)
AST: 20 U/L (ref 0–37)
Alkaline Phosphatase: 61 U/L (ref 39–117)
BILIRUBIN TOTAL: 0.8 mg/dL (ref 0.2–1.2)
BUN: 16 mg/dL (ref 6–23)
CALCIUM: 9.6 mg/dL (ref 8.4–10.5)
CO2: 30 meq/L (ref 19–32)
CREATININE: 0.8 mg/dL (ref 0.40–1.20)
Chloride: 103 mEq/L (ref 96–112)
GFR: 76.34 mL/min (ref >60.00)
Glucose, Bld: 151 mg/dL — ABNORMAL HIGH (ref 70–99)
Potassium: 4.3 mEq/L (ref 3.5–5.1)
SODIUM: 139 meq/L (ref 135–145)
Total Protein: 7.1 g/dL (ref 6.0–8.3)

## 2017-01-25 LAB — LIPID PANEL
CHOLESTEROL: 203 mg/dL — AB (ref 0–200)
HDL: 63.3 mg/dL (ref >39.00)
LDL CALC: 121 mg/dL — AB (ref 0–99)
NonHDL: 139.73
Total CHOL/HDL Ratio: 3
Triglycerides: 95 mg/dL (ref 0.0–149.0)
VLDL: 19 mg/dL (ref 0.0–40.0)

## 2017-01-25 LAB — CBC
HCT: 42.4 % (ref 36.0–46.0)
Hemoglobin: 14 g/dL (ref 12.0–15.0)
MCHC: 33.1 g/dL (ref 30.0–36.0)
MCV: 87.5 fl (ref 78.0–100.0)
PLATELETS: 270 10*3/uL (ref 150.0–400.0)
RBC: 4.84 Mil/uL (ref 3.87–5.11)
RDW: 13.5 % (ref 11.5–15.5)
WBC: 5.8 10*3/uL (ref 4.0–10.5)

## 2017-01-25 LAB — TSH: TSH: 2.72 u[IU]/mL (ref 0.35–4.50)

## 2017-01-25 LAB — HEMOGLOBIN A1C: Hgb A1c MFr Bld: 6.8 % — ABNORMAL HIGH (ref 4.6–6.5)

## 2017-01-27 ENCOUNTER — Ambulatory Visit (INDEPENDENT_AMBULATORY_CARE_PROVIDER_SITE_OTHER): Payer: PPO | Admitting: Family Medicine

## 2017-01-27 ENCOUNTER — Encounter: Payer: Self-pay | Admitting: Family Medicine

## 2017-01-27 VITALS — BP 135/80 | HR 87 | Temp 97.8°F | Ht 62.0 in | Wt 167.4 lb

## 2017-01-27 DIAGNOSIS — E118 Type 2 diabetes mellitus with unspecified complications: Secondary | ICD-10-CM | POA: Diagnosis not present

## 2017-01-27 DIAGNOSIS — Z7289 Other problems related to lifestyle: Secondary | ICD-10-CM

## 2017-01-27 DIAGNOSIS — Z1159 Encounter for screening for other viral diseases: Secondary | ICD-10-CM | POA: Diagnosis not present

## 2017-01-27 DIAGNOSIS — M8580 Other specified disorders of bone density and structure, unspecified site: Secondary | ICD-10-CM

## 2017-01-27 DIAGNOSIS — Z0001 Encounter for general adult medical examination with abnormal findings: Secondary | ICD-10-CM

## 2017-01-27 DIAGNOSIS — Z0000 Encounter for general adult medical examination without abnormal findings: Secondary | ICD-10-CM

## 2017-01-27 DIAGNOSIS — I10 Essential (primary) hypertension: Secondary | ICD-10-CM

## 2017-01-27 DIAGNOSIS — Z853 Personal history of malignant neoplasm of breast: Secondary | ICD-10-CM | POA: Diagnosis not present

## 2017-01-27 DIAGNOSIS — E782 Mixed hyperlipidemia: Secondary | ICD-10-CM | POA: Diagnosis not present

## 2017-01-27 DIAGNOSIS — H60502 Unspecified acute noninfective otitis externa, left ear: Secondary | ICD-10-CM | POA: Diagnosis not present

## 2017-01-27 DIAGNOSIS — R739 Hyperglycemia, unspecified: Secondary | ICD-10-CM

## 2017-01-27 HISTORY — DX: Encounter for general adult medical examination without abnormal findings: Z00.00

## 2017-01-27 MED ORDER — NEOMYCIN-POLYMYXIN-HC 1 % OT SOLN: 5.0000 [drp] | Freq: Two times a day (BID) | OTIC | 0 refills | Status: DC

## 2017-01-27 MED FILL — NEO/POLYMYXIN/HC EAR SOLN: 3.5-10000-1 | 20 days supply | Qty: 10 | Fill #0

## 2017-01-27 NOTE — Assessment & Plan Note (Signed)
hgba1c acceptable but worsening, minimize simple carbs. Increase exercise as tolerated.

## 2017-01-27 NOTE — Assessment & Plan Note (Signed)
Encouraged to get adequate exercise, calcium and vitamin d intake 

## 2017-01-27 NOTE — Patient Instructions (Signed)
WELCOME TO MEDICARE Spoke with you about Dr. Leafy Ro about the Weight Management Program. Also, spoke with you today in regards to Advanced Directives. Encouraged to you bring a notarized copy of your Living Will and Healthcare Power of Attorney into Dr. Frederik Pear Office to be scanned into your Chart. Preventive Care 60 Years and Older, Female Preventive care refers to lifestyle choices and visits with your health care provider that can promote health and wellness. What does preventive care include?  A yearly physical exam. This is also called an annual well check.  Dental exams once or twice a year.  Routine eye exams. Ask your health care provider how often you should have your eyes checked.  Personal lifestyle choices, including:  Daily care of your teeth and gums.  Regular physical activity.  Eating a healthy diet.  Avoiding tobacco and drug use.  Limiting alcohol use.  Practicing safe sex.  Taking low-dose aspirin every day.  Taking vitamin and mineral supplements as recommended by your health care provider. What happens during an annual well check? The services and screenings done by your health care provider during your annual well check will depend on your age, overall health, lifestyle risk factors, and family history of disease. Counseling  Your health care provider may ask you questions about your:  Alcohol use.  Tobacco use.  Drug use.  Emotional well-being.  Home and relationship well-being.  Sexual activity.  Eating habits.  History of falls.  Memory and ability to understand (cognition).  Work and work Statistician.  Reproductive health. Screening  You may have the following tests or measurements:  Height, weight, and BMI.  Blood pressure.  Lipid and cholesterol levels. These may be checked every 5 years, or more frequently if you are over 21 years old.  Skin check.  Lung cancer screening. You may have this screening every year starting at  age 5 if you have a 30-pack-year history of smoking and currently smoke or have quit within the past 15 years.  Fecal occult blood test (FOBT) of the stool. You may have this test every year starting at age 32.  Flexible sigmoidoscopy or colonoscopy. You may have a sigmoidoscopy every 5 years or a colonoscopy every 10 years starting at age 47.  Hepatitis C blood test.  Hepatitis B blood test.  Sexually transmitted disease (STD) testing.  Diabetes screening. This is done by checking your blood sugar (glucose) after you have not eaten for a while (fasting). You may have this done every 1-3 years.  Bone density scan. This is done to screen for osteoporosis. You may have this done starting at age 18.  Mammogram. This may be done every 1-2 years. Talk to your health care provider about how often you should have regular mammograms. Talk with your health care provider about your test results, treatment options, and if necessary, the need for more tests. Vaccines  Your health care provider may recommend certain vaccines, such as:  Influenza vaccine. This is recommended every year.  Tetanus, diphtheria, and acellular pertussis (Tdap, Td) vaccine. You may need a Td booster every 10 years.  Varicella vaccine. You may need this if you have not been vaccinated.  Zoster vaccine. You may need this after age 66.  Measles, mumps, and rubella (MMR) vaccine. You may need at least one dose of MMR if you were born in 1957 or later. You may also need a second dose.  Pneumococcal 13-valent conjugate (PCV13) vaccine. One dose is recommended after age 45.  Pneumococcal polysaccharide (PPSV23) vaccine. One dose is recommended after age 26.  Meningococcal vaccine. You may need this if you have certain conditions.  Hepatitis A vaccine. You may need this if you have certain conditions or if you travel or work in places where you may be exposed to hepatitis A.  Hepatitis B vaccine. You may need this if you  have certain conditions or if you travel or work in places where you may be exposed to hepatitis B.  Haemophilus influenzae type b (Hib) vaccine. You may need this if you have certain conditions. Talk to your health care provider about which screenings and vaccines you need and how often you need them. This information is not intended to replace advice given to you by your health care provider. Make sure you discuss any questions you have with your health care provider. Document Released: 11/14/2015 Document Revised: 07/07/2016 Document Reviewed: 08/19/2015 Elsevier Interactive Patient Education  2017 [employer].

## 2017-01-27 NOTE — Progress Notes (Signed)
Pre visit review using our clinic review tool, if applicable. No additional management support is needed unless otherwise documented below in the visit note. 

## 2017-01-27 NOTE — Assessment & Plan Note (Addendum)
Patient denies any difficulties at home. No trouble with ADLs, depression or falls. See EMR for functional status screen and depression screen. No recent changes to vision or hearing. Is UTD with immunizations. Is UTD with screening. Discussed Advanced Directives. Encouraged heart healthy diet, exercise as tolerated and adequate sleep. See patient's problem list for health risk factors to monitor. See AVS for preventative healthcare recommendation schedule. EKG done today, check Hep C screen EKG showed: NSR at 92 bpm no acute ST or T wave changes. low voltage.

## 2017-01-27 NOTE — Assessment & Plan Note (Signed)
Doing well, no longer seeing oncology and is keeping up with [facility] for annual [facility] and denies any concerns.

## 2017-01-27 NOTE — Progress Notes (Addendum)
Patient ID: Priscilla Butler, female   DOB: 07/12/1951, 66 y.o.   MRN: [id number]   Subjective:  I acted as a Education administrator for Penni Homans, [place], [place]   Patient ID: Priscilla Butler, female    DOB: May 31, 1951, 66 y.o.   MRN: [id number]  Chief Complaint  Patient presents with  . Welcome to Medicare    Hyperlipidemia  This is a chronic problem. The problem is controlled. Pertinent negatives include no chest pain or shortness of breath.  Hypertension  This is a chronic problem. The problem is controlled. Pertinent negatives include no blurred vision, chest pain, headaches, malaise/fatigue, palpitations or shortness of breath.    Patient is in today for a Welcome to Medicare Visit. Patient has a complaint of her left ear itching. Thinks that she may have scratched her ear; forming a scab, but is continuing to scratch her ear thus not allowing the scratch to fully heal. Patient has a Hx of osteopenia, Breast Cancer, hyperlipidemia, hyperglycemia, HTN. Patient has no additional acute concerns noted at this time. She is doing well with ADLs at home and eats a heart healthy diet most days. Had the flu in January but was able to improve without meds and feels well today. Denies CP/palp/SOB/HA/congestion/fevers/GI or GU c/o. Taking meds as prescribed  Patient Care Team: Mosie Lukes, MD as PCP - General (Family Medicine) Ronald Lobo, MD as Consulting Physician (Gastroenterology) Marygrace Drought, MD as Consulting Physician (Ophthalmology) [place] as Consulting Physician (Dermatology)   Past Medical History:  Diagnosis Date  . Breast cancer ([place]) 01/2008   Stage I, right breast  . 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 DeQuervain's disease (tenosynovitis)   . DM (diabetes mellitus), type 2 ([place])   . H/O measles   . H/O mumps   . History of chicken pox   . Hx of TB skin testing   . Hyperglycemia   . Hyperlipidemia   . Menopause   . Osteopenia   . Otitis externa  of left ear 01/30/2017  . Preventative health care 01/19/2016  . Welcome to Medicare preventive visit 01/27/2017    Past Surgical History:  Procedure Laterality Date  . BREAST LUMPECTOMY  03/2008   Right breast  . MOHS SURGERY  2013   BCC right upper lip, [place] skin surgery  . SKIN SURGERY     facial  skin cancer  . WISDOM TOOTH EXTRACTION  1972    Family History  Problem Relation Age of Onset  . Breast cancer Maternal Grandmother   . Cancer Maternal Grandmother 53    ish  . Cancer Maternal Grandfather     lung  . Hypertension Mother   . Hyperlipidemia Mother   . Arthritis Mother   . Other Paternal Grandmother     Social History   Social History  . Marital status: Married    Spouse name: [name]  . Number of children: 1  . Years of education: BA   Occupational History  . Retired Unemployed   Social History Main Topics  . Smoking status: Former Smoker    Quit date: 11/02/1999  . Smokeless tobacco: Never Used  . Alcohol use 1.8 oz/week    3 Cans of beer per week  . Drug use: No  . Sexual activity: Yes    Partners: Male    Birth control/ protection: Post-menopausal     Comment: lives with husband, retired from Press photographer, Therapist, music, No dietary restrictions   Other Topics  Concern  . Not on file   Social History Narrative  . No narrative on file    Outpatient Medications Prior to Visit  Medication Sig Dispense Refill  . aspirin 81 MG tablet Take 81 mg by mouth daily.      Marland Kitchen CALCIUM CITRATE-VITAMIN D PO Take 1 tablet by mouth 2 (two) times daily. 600-500 mg     . Cholecalciferol (VITAMIN D3) 1000 UNITS CAPS Take 1 capsule by mouth daily.     Marland Kitchen CRANBERRY PO Take 84 mg by mouth daily.    Marland Kitchen glucose blood (ONE TOUCH ULTRA TEST) test strip TEST BLOOD SUGAR TWICE A DAY AS DIRECTED DX E11.9 100 each 12  . metFORMIN (GLUCOPHAGE) 500 MG tablet TAKE 1 TABLET BY MOUTH DAILY WITH BREAKFAST. 90 tablet 1  . Multiple Vitamin (MULTIVITAMIN) tablet Take 1 tablet by mouth  daily.      . Omega-3 Fatty Acids (FISH OIL) 1000 MG CAPS Take 1 capsule by mouth 2 (two) times daily.      . simvastatin (ZOCOR) 40 MG tablet Take 1 tablet (40 mg total) by mouth daily. 90 tablet 2  . TURMERIC PO Take 450 mg by mouth 2 (two) times daily.     No facility-administered medications prior to visit.     No Known Allergies  Review of Systems  Constitutional: Negative for fever and malaise/fatigue.  HENT: Negative for congestion.   Eyes: Negative for blurred vision.  Respiratory: Negative for cough and shortness of breath.   Cardiovascular: Negative for chest pain, palpitations and leg swelling.  Gastrointestinal: Negative for vomiting.  Musculoskeletal: Negative for back pain.  Skin: Positive for itching. Negative for rash.       L ear.  Neurological: Negative for loss of consciousness and headaches.  Psychiatric/Behavioral: Negative for depression.       Objective:    Physical Exam  Constitutional: She is oriented to person, place, and time. She appears well-developed and well-nourished. No distress.  HENT:  Head: Normocephalic and atraumatic.  Eyes: Conjunctivae are normal.  flakey skin in left canal  Neck: Normal range of motion. No thyromegaly present.  Cardiovascular: Normal rate and regular rhythm.   Pulmonary/Chest: Effort normal and breath sounds normal. She has no wheezes.  Abdominal: Soft. Bowel sounds are normal. She exhibits no mass. There is no tenderness. There is no rebound and no guarding.  Musculoskeletal: She exhibits no edema or deformity.  Neurological: She is alert and oriented to person, place, and time.  Skin: Skin is warm and dry. She is not diaphoretic.  Psychiatric: She has a normal mood and affect.    BP 135/80 (BP Location: Left Arm, Patient Position: Sitting, Cuff Size: Normal)   Pulse 87   Temp 97.8 F (36.6 C) (Oral)   Ht 5\' 2"  (1.575 m)   Wt 167 lb 6.4 oz (75.9 kg)   BMI 30.62 kg/m  Wt Readings from Last 3 Encounters:    01/27/17 167 lb 6.4 oz (75.9 kg)  07/22/16 164 lb 12.8 oz (74.8 kg)  01/19/16 179 lb 4 oz (81.3 kg)   BP Readings from Last 3 Encounters:  01/27/17 135/80  07/22/16 116/74  01/19/16 122/82     Immunization History  Administered Date(s) Administered  . Hepatitis A 12/29/2012  . Hepatitis A, Adult 07/25/2013  . Influenza Split 08/02/2012  . Influenza, High Dose Seasonal PF 07/22/2016  . Influenza,inj,Quad PF,36+ Mos 07/25/2013  . Influenza-Unspecified 08/12/2014, 08/16/2015  . Pneumococcal Conjugate-13 07/22/2016  . Pneumococcal Polysaccharide-23 12/04/2012  .  Tdap 10/04/2011    Health Maintenance  Topic Date Due  . Hepatitis C Screening  04/12/1951  . FOOT EXAM  05/09/1961  . OPHTHALMOLOGY EXAM  05/09/1961  . HIV Screening  05/09/1966  . COLON CANCER SCREENING ANNUAL FOBT  04/08/2017  . INFLUENZA VACCINE  06/01/2017  . HEMOGLOBIN A1C  07/28/2017  . PAP SMEAR  12/04/2017  . PNA vac Low Risk Adult (2 of 2 - PPSV23) 12/04/2017  . URINE MICROALBUMIN  01/27/2018  . MAMMOGRAM  08/18/2018  . TETANUS/TDAP  10/03/2021  . DEXA SCAN  Completed    Lab Results  Component Value Date   WBC 5.8 01/25/2017   HGB 14.0 01/25/2017   HCT 42.4 01/25/2017   PLT 270.0 01/25/2017   GLUCOSE 151 (H) 01/25/2017   CHOL 203 (H) 01/25/2017   TRIG 95.0 01/25/2017   HDL 63.30 01/25/2017   LDLCALC 121 (H) 01/25/2017   ALT 25 01/25/2017   AST 20 01/25/2017   NA 139 01/25/2017   K 4.3 01/25/2017   CL 103 01/25/2017   CREATININE 0.80 01/25/2017   BUN 16 01/25/2017   CO2 30 01/25/2017   TSH 2.72 01/25/2017   HGBA1C 6.8 (H) 01/25/2017   MICROALBUR 2.6 01/27/2017    Lab Results  Component Value Date   TSH 2.72 01/25/2017   Lab Results  Component Value Date   WBC 5.8 01/25/2017   HGB 14.0 01/25/2017   HCT 42.4 01/25/2017   MCV 87.5 01/25/2017   PLT 270.0 01/25/2017   Lab Results  Component Value Date   NA 139 01/25/2017   K 4.3 01/25/2017   CO2 30 01/25/2017   GLUCOSE 151 (H)  01/25/2017   BUN 16 01/25/2017   CREATININE 0.80 01/25/2017   BILITOT 0.8 01/25/2017   ALKPHOS 61 01/25/2017   AST 20 01/25/2017   ALT 25 01/25/2017   PROT 7.1 01/25/2017   ALBUMIN 4.6 01/25/2017   CALCIUM 9.6 01/25/2017   GFR 76.34 01/25/2017   Lab Results  Component Value Date   CHOL 203 (H) 01/25/2017   Lab Results  Component Value Date   HDL 63.30 01/25/2017   Lab Results  Component Value Date   LDLCALC 121 (H) 01/25/2017   Lab Results  Component Value Date   TRIG 95.0 01/25/2017   Lab Results  Component Value Date   CHOLHDL 3 01/25/2017   Lab Results  Component Value Date   HGBA1C 6.8 (H) 01/25/2017         Assessment & Plan:   Problem List Items Addressed This Visit    HX: breast cancer    Doing well, no longer seeing oncology and is keeping up with [facility] for annual [facility] and denies any concerns.       Hyperlipidemia    Encouraged heart healthy diet, increase exercise, avoid trans fats, consider a krill oil cap daily      Relevant Orders   Lipid panel   DM (diabetes mellitus), type 2 ([place])    hgba1c acceptable but worsening, minimize simple carbs. Increase exercise as tolerated.       Osteopenia    Encouraged to get adequate exercise, calcium and vitamin d intake      Welcome to Medicare preventive visit - Primary    Patient denies any difficulties at home. No trouble with ADLs, depression or falls. See EMR for functional status screen and depression screen. No recent changes to vision or hearing. Is UTD with immunizations. Is UTD with screening. Discussed Advanced Directives. Encouraged  heart healthy diet, exercise as tolerated and adequate sleep. See patient's problem list for health risk factors to monitor. See AVS for preventative healthcare recommendation schedule. EKG done today, check Hep C screen EKG showed: NSR at 92 bpm no acute ST or T wave changes. low voltage.       Relevant Orders   EKG 12-Lead (Completed)   CBC   Comprehensive  metabolic panel   TSH   Otitis externa of left ear    Otic drops as directed       Other Visit Diagnoses    Encounter for hepatitis C screening test for low risk patient       Relevant Orders   Hepatitis C antibody   Microalbumin, urine (Completed)   Other problems related to lifestyle       Relevant Orders   Hepatitis C antibody   Hypertension, unspecified type       Relevant Orders   CBC   Comprehensive metabolic panel   TSH      I am having Ms. Reesman start on NEOMYCIN-POLYMYXIN-HYDROCORTISONE. I am also having her maintain her multivitamin, Vitamin D3, aspirin, Fish Oil, TURMERIC PO, CRANBERRY PO, simvastatin, glucose blood, CALCIUM CITRATE-VITAMIN D PO, metFORMIN, triamcinolone cream, and latanoprost.  Meds ordered this encounter  Medications  . triamcinolone cream (KENALOG) 0.1 %    Refill:  0  . latanoprost (XALATAN) 0.005 % ophthalmic solution    Refill:  99  . NEOMYCIN-POLYMYXIN-HYDROCORTISONE (CORTISPORIN) 1 % SOLN otic solution    Sig: Place 5 drops into the left ear 2 (two) times daily.    Dispense:  10 mL    Refill:  0    CMA served as scribe during this visit. History, Physical and Plan performed by medical provider. Documentation and orders reviewed and attested to.  Penni Homans, MD

## 2017-01-27 NOTE — Assessment & Plan Note (Signed)
Encouraged heart healthy diet, increase exercise, avoid trans fats, consider a krill oil cap daily 

## 2017-01-28 LAB — MICROALBUMIN, URINE: Microalb, Ur: 2.6 mg/dL

## 2017-01-30 ENCOUNTER — Encounter: Payer: Self-pay | Admitting: Family Medicine

## 2017-01-30 DIAGNOSIS — H6092 Unspecified otitis externa, left ear: Secondary | ICD-10-CM

## 2017-01-30 HISTORY — DX: Unspecified otitis externa, left ear: H60.92

## 2017-01-30 NOTE — Assessment & Plan Note (Addendum)
Otic drops as directed

## 2017-02-08 ENCOUNTER — Other Ambulatory Visit: Payer: Self-pay | Admitting: Family Medicine

## 2017-02-08 DIAGNOSIS — E785 Hyperlipidemia, unspecified: Secondary | ICD-10-CM

## 2017-02-08 MED FILL — SIMVASTATIN 40 MG TABLET: 40 | 90 days supply | Qty: 90 | Fill #0

## 2017-02-21 MED FILL — LATANOPROST 0.005% EYE DRP: 0.005 | 25 days supply | Qty: 3 | Fill #8

## 2017-03-24 MED FILL — LATANOPROST 0.005% EYE DRP: 0.005 | 25 days supply | Qty: 3 | Fill #9

## 2017-03-30 DIAGNOSIS — H401122 Primary open-angle glaucoma, left eye, moderate stage: Secondary | ICD-10-CM | POA: Diagnosis not present

## 2017-03-30 DIAGNOSIS — H401113 Primary open-angle glaucoma, right eye, severe stage: Secondary | ICD-10-CM | POA: Diagnosis not present

## 2017-04-08 MED FILL — metFORMIN HCL 500 MG TABS: 500 | 90 days supply | Qty: 90 | Fill #1

## 2017-04-26 MED FILL — LATANOPROST 0.005% EYE DRP: 0.005 | 25 days supply | Qty: 3 | Fill #10

## 2017-05-23 ENCOUNTER — Other Ambulatory Visit: Payer: Self-pay | Admitting: Family Medicine

## 2017-05-23 MED FILL — SIMVASTATIN 40 MG TABLET: 40 | 90 days supply | Qty: 90 | Fill #1

## 2017-05-23 MED FILL — LATANOPROST 0.005% EYE DRP: 0.005 | 25 days supply | Qty: 3 | Fill #11

## 2017-05-24 MED FILL — ONE TOUCH ULTRA TEST STRIPS: 50 days supply | Qty: 100 | Fill #0

## 2017-06-16 ENCOUNTER — Other Ambulatory Visit: Payer: Self-pay | Admitting: Pharmacy Technician

## 2017-06-16 NOTE — Patient Outreach (Signed)
[facility]) Care Management  06/16/2017  Priscilla Butler 1951/04/03 [id number]  Contacted Lauralyn Primes at [place] in reference to medication adherence for [employer]. After looking over the information I noticed Simvastatin 40 mg had an incorrect days supply entered and was causing the patient's adherence to be affected. Lauralyn Primes confirmed that the information was entered as a 1 day supply instead of 90 and also confirmed that her refill in July was entered incorrectly as well. Lauralyn Primes is updating the information to reflect the correct information on both refills. At this time I do not need to contact the patient in reference to her adherence.  Doreene Burke, [place] [phone]

## 2017-06-20 MED FILL — LATANOPROST 0.005% EYE DRP: 0.005 | 25 days supply | Qty: 3 | Fill #12

## 2017-07-05 ENCOUNTER — Other Ambulatory Visit: Payer: Self-pay | Admitting: Family Medicine

## 2017-07-05 MED FILL — metFORMIN HCL 500 MG TABS: 500 | 90 days supply | Qty: 90 | Fill #0

## 2017-07-05 NOTE — Telephone Encounter (Signed)
Faxed 90d Metformin/thx dmf

## 2017-07-20 ENCOUNTER — Other Ambulatory Visit: Payer: Self-pay | Admitting: Pharmacist

## 2017-07-20 NOTE — Patient Outreach (Signed)
Incoming call from [employer] in response to the [facility] Medication Adherence Campaign. Speak with patient. HIPAA identifiers verified and verbal consent received.  Priscilla Butler reports that she has been taking her simvastatin as directed. Patient states that she may miss a dose of the medication once a week, but that she feels that this is fine and reports that she does not wish to change. Counsel patient about the importance medication adherence to simvastatin.   Priscilla Butler reports that she has no further medication questions/concerns at this time.  Harlow Asa, PharmD, [place] [facility] [phone]

## 2017-07-21 ENCOUNTER — Other Ambulatory Visit (INDEPENDENT_AMBULATORY_CARE_PROVIDER_SITE_OTHER): Payer: PPO

## 2017-07-21 DIAGNOSIS — R739 Hyperglycemia, unspecified: Secondary | ICD-10-CM | POA: Diagnosis not present

## 2017-07-21 DIAGNOSIS — I10 Essential (primary) hypertension: Secondary | ICD-10-CM | POA: Diagnosis not present

## 2017-07-21 DIAGNOSIS — Z7289 Other problems related to lifestyle: Secondary | ICD-10-CM

## 2017-07-21 DIAGNOSIS — Z0000 Encounter for general adult medical examination without abnormal findings: Secondary | ICD-10-CM | POA: Diagnosis not present

## 2017-07-21 DIAGNOSIS — E782 Mixed hyperlipidemia: Secondary | ICD-10-CM | POA: Diagnosis not present

## 2017-07-21 DIAGNOSIS — Z1159 Encounter for screening for other viral diseases: Secondary | ICD-10-CM | POA: Diagnosis not present

## 2017-07-21 LAB — COMPREHENSIVE METABOLIC PANEL
ALK PHOS: 56 U/L (ref 39–117)
ALT: 22 U/L (ref 0–35)
AST: 19 U/L (ref 0–37)
Albumin: 4.5 g/dL (ref 3.5–5.2)
BUN: 16 mg/dL (ref 6–23)
CO2: 28 mEq/L (ref 19–32)
Calcium: 9.5 mg/dL (ref 8.4–10.5)
Chloride: 105 mEq/L (ref 96–112)
Creatinine, Ser: 0.74 mg/dL (ref 0.40–1.20)
GFR: 83.4 mL/min (ref >60.00)
GLUCOSE: 132 mg/dL — AB (ref 70–99)
POTASSIUM: 3.8 meq/L (ref 3.5–5.1)
Sodium: 139 mEq/L (ref 135–145)
TOTAL PROTEIN: 6.9 g/dL (ref 6.0–8.3)
Total Bilirubin: 0.9 mg/dL (ref 0.2–1.2)

## 2017-07-21 LAB — LIPID PANEL
CHOLESTEROL: 194 mg/dL (ref 0–200)
HDL: 71 mg/dL (ref >39.00)
LDL Cholesterol: 101 mg/dL — ABNORMAL HIGH (ref 0–99)
NonHDL: 122.91
Total CHOL/HDL Ratio: 3
Triglycerides: 109 mg/dL (ref 0.0–149.0)
VLDL: 21.8 mg/dL (ref 0.0–40.0)

## 2017-07-21 LAB — CBC
HEMATOCRIT: 42.4 % (ref 36.0–46.0)
HEMOGLOBIN: 14.1 g/dL (ref 12.0–15.0)
MCHC: 33.1 g/dL (ref 30.0–36.0)
MCV: 89.1 fl (ref 78.0–100.0)
Platelets: 251 10*3/uL (ref 150.0–400.0)
RBC: 4.76 Mil/uL (ref 3.87–5.11)
RDW: 12.8 % (ref 11.5–15.5)
WBC: 5 10*3/uL (ref 4.0–10.5)

## 2017-07-21 LAB — HEMOGLOBIN A1C: Hgb A1c MFr Bld: 6.6 % — ABNORMAL HIGH (ref 4.6–6.5)

## 2017-07-21 LAB — TSH: TSH: 1.94 u[IU]/mL (ref 0.35–4.50)

## 2017-07-21 NOTE — Addendum Note (Signed)
Addended by: Caffie Pinto on: 07/21/2017 08:44 AM   Modules accepted: Orders

## 2017-07-22 LAB — HEPATITIS C ANTIBODY
HEP C AB: NONREACTIVE
SIGNAL TO CUT-OFF: 0.01 (ref <1.00)

## 2017-07-27 DIAGNOSIS — H524 Presbyopia: Secondary | ICD-10-CM | POA: Diagnosis not present

## 2017-07-27 DIAGNOSIS — H401113 Primary open-angle glaucoma, right eye, severe stage: Secondary | ICD-10-CM | POA: Diagnosis not present

## 2017-07-27 DIAGNOSIS — H401122 Primary open-angle glaucoma, left eye, moderate stage: Secondary | ICD-10-CM | POA: Diagnosis not present

## 2017-07-27 DIAGNOSIS — H2513 Age-related nuclear cataract, bilateral: Secondary | ICD-10-CM | POA: Diagnosis not present

## 2017-07-27 LAB — HM DIABETES EYE EXAM

## 2017-07-28 ENCOUNTER — Telehealth: Payer: Self-pay | Admitting: *Deleted

## 2017-07-28 ENCOUNTER — Ambulatory Visit (INDEPENDENT_AMBULATORY_CARE_PROVIDER_SITE_OTHER): Payer: PPO | Admitting: Family Medicine

## 2017-07-28 ENCOUNTER — Encounter: Payer: Self-pay | Admitting: Family Medicine

## 2017-07-28 VITALS — BP 139/87 | HR 88 | Temp 98.3°F | Ht 62.6 in | Wt 162.0 lb

## 2017-07-28 DIAGNOSIS — E782 Mixed hyperlipidemia: Secondary | ICD-10-CM

## 2017-07-28 DIAGNOSIS — M8580 Other specified disorders of bone density and structure, unspecified site: Secondary | ICD-10-CM | POA: Diagnosis not present

## 2017-07-28 DIAGNOSIS — Z23 Encounter for immunization: Secondary | ICD-10-CM

## 2017-07-28 DIAGNOSIS — E118 Type 2 diabetes mellitus with unspecified complications: Secondary | ICD-10-CM | POA: Diagnosis not present

## 2017-07-28 MED ORDER — ROSUVASTATIN CALCIUM 40 MG PO TABS: 40.0000 mg | ORAL_TABLET | Freq: Every day | ORAL | 1 refills | Status: DC

## 2017-07-28 MED FILL — ROSUVASTATIN CALCIUM 40 MG: 40 | 90 days supply | Qty: 90 | Fill #0

## 2017-07-28 MED FILL — ONE TOUCH ULTRA TEST STRIPS: 50 days supply | Qty: 100 | Fill #1

## 2017-07-28 MED FILL — LATANOPROST 0.005% EYE DRP: 0.005 | 25 days supply | Qty: 3 | Fill #0

## 2017-07-28 NOTE — Patient Instructions (Signed)
Shingrix is the new shingles shot, it is 2 shots over 2-6 months. Check with insurance to ensure coverage Carbohydrate Counting for Diabetes Mellitus, Adult Carbohydrate counting is a method for keeping track of how many carbohydrates you eat. Eating carbohydrates naturally increases the amount of sugar (glucose) in the blood. Counting how many carbohydrates you eat helps keep your blood glucose within normal limits, which helps you manage your diabetes (diabetes mellitus). It is important to know how many carbohydrates you can safely have in each meal. This is different for every person. A diet and nutrition specialist (registered dietitian) can help you make a meal plan and calculate how many carbohydrates you should have at each meal and snack. Carbohydrates are found in the following foods:  Grains, such as breads and cereals.  Dried beans and soy products.  Starchy vegetables, such as potatoes, peas, and corn.  Fruit and fruit juices.  Milk and yogurt.  Sweets and snack foods, such as cake, cookies, candy, chips, and soft drinks.  How do I count carbohydrates? There are two ways to count carbohydrates in food. You can use either of the methods or a combination of both. Reading "Nutrition Facts" on packaged food The "Nutrition Facts" list is included on the labels of almost all packaged foods and beverages in the [place] It includes:  The serving size.  Information about nutrients in each serving, including the grams (g) of carbohydrate per serving.  To use the "Nutrition Facts":  Decide how many servings you will have.  Multiply the number of servings by the number of carbohydrates per serving.  The resulting number is the total amount of carbohydrates that you will be having.  Learning standard serving sizes of other foods When you eat foods containing carbohydrates that are not packaged or do not include "Nutrition Facts" on the label, you need to measure the servings in order  to count the amount of carbohydrates:  Measure the foods that you will eat with a food scale or measuring cup, if needed.  Decide how many standard-size servings you will eat.  Multiply the number of servings by 15. Most carbohydrate-rich foods have about 15 g of carbohydrates per serving. ? For example, if you eat 8 oz (170 g) of strawberries, you will have eaten 2 servings and 30 g of carbohydrates (2 servings x 15 g = 30 g).  For foods that have more than one food mixed, such as soups and casseroles, you must count the carbohydrates in each food that is included.  The following list contains standard serving sizes of common carbohydrate-rich foods. Each of these servings has about 15 g of carbohydrates:   hamburger bun or  English muffin.   oz (15 mL) syrup.   oz (14 g) jelly.  1 slice of bread.  1 six-inch tortilla.  3 oz (85 g) cooked rice or pasta.  4 oz (113 g) cooked dried beans.  4 oz (113 g) starchy vegetable, such as peas, corn, or potatoes.  4 oz (113 g) hot cereal.  4 oz (113 g) mashed potatoes or  of a large baked potato.  4 oz (113 g) canned or frozen fruit.  4 oz (120 mL) fruit juice.  4-6 crackers.  6 chicken nuggets.  6 oz (170 g) unsweetened dry cereal.  6 oz (170 g) plain fat-free yogurt or yogurt sweetened with artificial sweeteners.  8 oz (240 mL) milk.  8 oz (170 g) fresh fruit or one small piece of fruit.  24  oz (680 g) popped popcorn.  Example of carbohydrate counting Sample meal  3 oz (85 g) chicken breast.  6 oz (170 g) [place] rice.  4 oz (113 g) corn.  8 oz (240 mL) milk.  8 oz (170 g) strawberries with sugar-free whipped topping. Carbohydrate calculation 1. Identify the foods that contain carbohydrates: ? Rice. ? Corn. ? Milk. ? Strawberries. 2. Calculate how many servings you have of each food: ? 2 servings rice. ? 1 serving corn. ? 1 serving milk. ? 1 serving strawberries. 3. Multiply each number of servings  by 15 g: ? 2 servings rice x 15 g = 30 g. ? 1 serving corn x 15 g = 15 g. ? 1 serving milk x 15 g = 15 g. ? 1 serving strawberries x 15 g = 15 g. 4. Add together all of the amounts to find the total grams of carbohydrates eaten: ? 30 g + 15 g + 15 g + 15 g = 75 g of carbohydrates total. This information is not intended to replace advice given to you by your health care provider. Make sure you discuss any questions you have with your health care provider. Document Released: 10/18/2005 Document Revised: 05/07/2016 Document Reviewed: 03/31/2016 Elsevier Interactive Patient Education  [employer].

## 2017-07-28 NOTE — Assessment & Plan Note (Signed)
Not adequately controlled on Simvastatin 40 mg daily will try switching to Rosuvastatin 40 mg daily

## 2017-07-28 NOTE — Assessment & Plan Note (Signed)
hgba1c acceptable, minimize simple carbs. Increase exercise as tolerated. Continue current meds 

## 2017-07-28 NOTE — Telephone Encounter (Signed)
Received results from [facility] Ophthal[facility]; forwarded to provider/SLS 09/27

## 2017-07-28 NOTE — Progress Notes (Signed)
Subjective:  I acted as a Education administrator for Dr. Rogue Jury, CMA   Patient ID: Priscilla Butler, female    DOB: 1951/04/17, 66 y.o.   MRN: [id number]  Chief Complaint  Patient presents with  . Follow-up  . Hypertension  . Medication Management    HPI  Patient is in today for her 6 month follow up. Wants do discuss Rx changes and adjustments as well as if she should get her flu vaccine today or after her upcoming trip. She denies any recent febrile illness or hospitalization. Denies CP/palp/SOB/HA/congestion/fevers/GI or GU c/o. Taking meds as prescribed  Patient Care Team: Mosie Lukes, MD as PCP - General (Family Medicine) Ronald Lobo, MD as Consulting Physician (Gastroenterology) Marygrace Drought, MD as Consulting Physician (Ophthalmology) [facility] as Consulting Physician (Dermatology)   Past Medical History:  Diagnosis Date  . Breast cancer ([place]) 01/2008   Stage I, right breast  . 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 DeQuervain's disease (tenosynovitis)   . DM (diabetes mellitus), type 2 ([place])   . H/O measles   . H/O mumps   . History of chicken pox   . Hx of TB skin testing   . Hyperglycemia   . Hyperlipidemia   . Menopause   . Osteopenia   . Otitis externa of left ear 01/30/2017  . Preventative health care 01/19/2016  . Welcome to Medicare preventive visit 01/27/2017    Past Surgical History:  Procedure Laterality Date  . BREAST LUMPECTOMY  03/2008   Right breast  . MOHS SURGERY  2013   BCC right upper lip, [place] skin surgery  . SKIN SURGERY     facial  skin cancer  . WISDOM TOOTH EXTRACTION  1972    Family History  Problem Relation Age of Onset  . Breast cancer Maternal Grandmother   . Cancer Maternal Grandmother 69       ish  . Cancer Maternal Grandfather        lung  . Hypertension Mother   . Hyperlipidemia Mother   . Arthritis Mother   . Other Paternal Grandmother     Social History   Social History  . Marital  status: Married    Spouse name: [name]  . Number of children: 1  . Years of education: BA   Occupational History  . Retired Unemployed   Social History Main Topics  . Smoking status: Former Smoker    Quit date: 11/02/1999  . Smokeless tobacco: Never Used  . Alcohol use 1.8 oz/week    3 Cans of beer per week  . Drug use: No  . Sexual activity: Yes    Partners: Male    Birth control/ protection: Post-menopausal     Comment: lives with husband, retired from Press photographer, Therapist, music, No dietary restrictions   Other Topics Concern  . Not on file   Social History Narrative  . No narrative on file    Outpatient Medications Prior to Visit  Medication Sig Dispense Refill  . aspirin 81 MG tablet Take 81 mg by mouth daily.      Marland Kitchen CALCIUM CITRATE-VITAMIN D PO Take 1 tablet by mouth 2 (two) times daily. 600-500 mg     . Cholecalciferol (VITAMIN D3) 1000 UNITS CAPS Take 1 capsule by mouth daily.     Marland Kitchen CRANBERRY PO Take 84 mg by mouth daily.    Marland Kitchen latanoprost (XALATAN) 0.005 % ophthalmic solution   99  . metFORMIN (GLUCOPHAGE) 500 MG tablet  TAKE 1 TABLET BY MOUTH DAILY WITH BREAKFAST. 90 tablet 0  . Multiple Vitamin (MULTIVITAMIN) tablet Take 1 tablet by mouth daily.      . Omega-3 Fatty Acids (FISH OIL) 1000 MG CAPS Take 1 capsule by mouth 2 (two) times daily.      . ONE TOUCH ULTRA TEST test strip USE TO TEST BLOOD SUGAR TWICE A DAY AS DIRECTED 100 each 12  . TURMERIC PO Take 450 mg by mouth 2 (two) times daily.    . NEOMYCIN-POLYMYXIN-HYDROCORTISONE (CORTISPORIN) 1 % SOLN otic solution Place 5 drops into the left ear 2 (two) times daily. 10 mL 0  . simvastatin (ZOCOR) 40 MG tablet TAKE 1 TABLET (40 MG TOTAL) BY MOUTH DAILY. 90 tablet 2  . triamcinolone cream (KENALOG) 0.1 %   0   No facility-administered medications prior to visit.     No Known Allergies  Review of Systems  Constitutional: Negative for fever and malaise/fatigue.  HENT: Negative for congestion.   Eyes: Negative for  blurred vision.  Respiratory: Negative for shortness of breath.   Cardiovascular: Negative for chest pain, palpitations and leg swelling.  Gastrointestinal: Negative for abdominal pain, blood in stool and nausea.  Genitourinary: Negative for dysuria and frequency.  Musculoskeletal: Negative for falls.  Skin: Negative for rash.  Neurological: Negative for dizziness, loss of consciousness and headaches.  Endo/Heme/Allergies: Negative for environmental allergies.  Psychiatric/Behavioral: Negative for depression. The patient is not nervous/anxious.        Objective:    Physical Exam  Constitutional: She is oriented to person, place, and time. She appears well-developed and well-nourished. No distress.  HENT:  Head: Normocephalic and atraumatic.  Nose: Nose normal.  Eyes: Right eye exhibits no discharge. Left eye exhibits no discharge.  Neck: Normal range of motion. Neck supple.  Cardiovascular: Normal rate and regular rhythm.   No murmur heard. Pulmonary/Chest: Effort normal and breath sounds normal.  Abdominal: Soft. Bowel sounds are normal. There is no tenderness.  Musculoskeletal: She exhibits no edema.  Neurological: She is alert and oriented to person, place, and time.  Skin: Skin is warm and dry.  Psychiatric: She has a normal mood and affect.  Nursing note and vitals reviewed.   BP 139/87   Pulse 88   Temp 98.3 F (36.8 C) (Oral)   Ht 5' 2.6" (1.59 m)   Wt 162 lb (73.5 kg)   SpO2 98%   BMI 29.06 kg/m  Wt Readings from Last 3 Encounters:  07/28/17 162 lb (73.5 kg)  01/27/17 167 lb 6.4 oz (75.9 kg)  07/22/16 164 lb 12.8 oz (74.8 kg)   BP Readings from Last 3 Encounters:  07/28/17 139/87  01/27/17 135/80  07/22/16 116/74     Immunization History  Administered Date(s) Administered  . Hepatitis A 12/29/2012  . Hepatitis A, Adult 07/25/2013  . Influenza Split 08/02/2012  . Influenza, High Dose Seasonal PF 07/22/2016, 07/28/2017  . Influenza,inj,Quad PF,6+ Mos  07/25/2013  . Influenza-Unspecified 08/12/2014, 08/16/2015  . Pneumococcal Conjugate-13 07/22/2016  . Pneumococcal Polysaccharide-23 12/04/2012  . Tdap 10/04/2011    Health Maintenance  Topic Date Due  . FOOT EXAM  05/09/1961  . COLON CANCER SCREENING ANNUAL FOBT  04/08/2017  . PNA vac Low Risk Adult (2 of 2 - PPSV23) 12/04/2017  . HEMOGLOBIN A1C  01/18/2018  . URINE MICROALBUMIN  01/27/2018  . OPHTHALMOLOGY EXAM  07/27/2018  . MAMMOGRAM  08/18/2018  . TETANUS/TDAP  10/03/2021  . INFLUENZA VACCINE  Completed  . DEXA  SCAN  Completed  . Hepatitis C Screening  Completed    Lab Results  Component Value Date   WBC 5.0 07/21/2017   HGB 14.1 07/21/2017   HCT 42.4 07/21/2017   PLT 251.0 07/21/2017   GLUCOSE 132 (H) 07/21/2017   CHOL 194 07/21/2017   TRIG 109.0 07/21/2017   HDL 71.00 07/21/2017   LDLCALC 101 (H) 07/21/2017   ALT 22 07/21/2017   AST 19 07/21/2017   NA 139 07/21/2017   K 3.8 07/21/2017   CL 105 07/21/2017   CREATININE 0.74 07/21/2017   BUN 16 07/21/2017   CO2 28 07/21/2017   TSH 1.94 07/21/2017   HGBA1C 6.6 (H) 07/21/2017   MICROALBUR 2.6 01/27/2017    Lab Results  Component Value Date   TSH 1.94 07/21/2017   Lab Results  Component Value Date   WBC 5.0 07/21/2017   HGB 14.1 07/21/2017   HCT 42.4 07/21/2017   MCV 89.1 07/21/2017   PLT 251.0 07/21/2017   Lab Results  Component Value Date   NA 139 07/21/2017   K 3.8 07/21/2017   CO2 28 07/21/2017   GLUCOSE 132 (H) 07/21/2017   BUN 16 07/21/2017   CREATININE 0.74 07/21/2017   BILITOT 0.9 07/21/2017   ALKPHOS 56 07/21/2017   AST 19 07/21/2017   ALT 22 07/21/2017   PROT 6.9 07/21/2017   ALBUMIN 4.5 07/21/2017   CALCIUM 9.5 07/21/2017   GFR 83.40 07/21/2017   Lab Results  Component Value Date   CHOL 194 07/21/2017   Lab Results  Component Value Date   HDL 71.00 07/21/2017   Lab Results  Component Value Date   LDLCALC 101 (H) 07/21/2017   Lab Results  Component Value Date   TRIG  109.0 07/21/2017   Lab Results  Component Value Date   CHOLHDL 3 07/21/2017   Lab Results  Component Value Date   HGBA1C 6.6 (H) 07/21/2017         Assessment & Plan:   Problem List Items Addressed This Visit    Hyperlipidemia    Not adequately controlled on Simvastatin 40 mg daily will try switching to Rosuvastatin 40 mg daily      Relevant Medications   rosuvastatin (CRESTOR) 40 MG tablet   DM (diabetes mellitus), type 2 (HCC)    hgba1c acceptable, minimize simple carbs. Increase exercise as tolerated. Continue current meds      Relevant Medications   rosuvastatin (CRESTOR) 40 MG tablet   Osteopenia    Encouraged to get adequate exercise, calcium and vitamin d intake       Other Visit Diagnoses    Need for influenza vaccination    -  Primary   Relevant Orders   Flu vaccine HIGH DOSE PF (Fluzone High dose) (Completed)      I have discontinued Ms. Mcnellis's triamcinolone cream, NEOMYCIN-POLYMYXIN-HYDROCORTISONE, and simvastatin. I am also having her start on rosuvastatin. Additionally, I am having her maintain her multivitamin, Vitamin D3, aspirin, Fish Oil, TURMERIC PO, CRANBERRY PO, CALCIUM CITRATE-VITAMIN D PO, latanoprost, ONE TOUCH ULTRA TEST, and metFORMIN.  Meds ordered this encounter  Medications  . rosuvastatin (CRESTOR) 40 MG tablet    Sig: Take 1 tablet (40 mg total) by mouth daily.    Dispense:  90 tablet    Refill:  1    CMA served as Education administrator during this visit. History, Physical and Plan performed by medical provider. Documentation and orders reviewed and attested to.  Penni Homans, MD

## 2017-07-29 ENCOUNTER — Encounter: Payer: Self-pay | Admitting: Family Medicine

## 2017-07-31 NOTE — Assessment & Plan Note (Signed)
Encouraged to get adequate exercise, calcium and vitamin d intake 

## 2017-08-03 ENCOUNTER — Telehealth: Payer: Self-pay | Admitting: *Deleted

## 2017-08-03 NOTE — Telephone Encounter (Signed)
Received results from [facility]; forwarded to provider/SLS 10/03

## 2017-08-05 ENCOUNTER — Encounter: Payer: Self-pay | Admitting: Family Medicine

## 2017-08-22 MED FILL — LATANOPROST 0.005% EYE DRP: 0.005 | 25 days supply | Qty: 3 | Fill #1

## 2017-09-27 MED FILL — LATANOPROST 0.005% EYE DRP: 0.005 | 25 days supply | Qty: 3 | Fill #2

## 2017-10-12 ENCOUNTER — Other Ambulatory Visit: Payer: Self-pay | Admitting: Family Medicine

## 2017-10-14 MED FILL — metFORMIN HCL 500 MG TABS: 500 | 90 days supply | Qty: 90 | Fill #0

## 2017-10-26 MED FILL — LATANOPROST 0.005% EYE DRP: 0.005 | 25 days supply | Qty: 3 | Fill #3

## 2017-10-26 MED FILL — ROSUVASTATIN CALCIUM 40 MG: 40 | 90 days supply | Qty: 90 | Fill #1

## 2017-11-17 ENCOUNTER — Other Ambulatory Visit: Payer: Self-pay | Admitting: Family Medicine

## 2017-11-17 ENCOUNTER — Encounter: Payer: Self-pay | Admitting: Family Medicine

## 2017-11-17 MED ORDER — NEOMYCIN-POLYMYXIN-HC 1 % OT SOLN: 5.0000 [drp] | Freq: Two times a day (BID) | OTIC | 0 refills | Status: DC

## 2017-11-18 ENCOUNTER — Telehealth: Payer: Self-pay | Admitting: *Deleted

## 2017-11-18 MED FILL — NEOMYCIN-POLYMYXIN-HC EAR S: 3.5-10000-1 | 20 days supply | Qty: 10 | Fill #0

## 2017-11-18 NOTE — Telephone Encounter (Signed)
Received Physician Orders from [facility] for [place]; forwarded to provider/SLS 01/18

## 2017-11-23 ENCOUNTER — Encounter: Payer: Self-pay | Admitting: Family Medicine

## 2017-11-23 DIAGNOSIS — M8589 Other specified disorders of bone density and structure, multiple sites: Secondary | ICD-10-CM | POA: Diagnosis not present

## 2017-11-23 DIAGNOSIS — Z1231 Encounter for screening mammogram for malignant neoplasm of breast: Secondary | ICD-10-CM | POA: Diagnosis not present

## 2017-11-23 DIAGNOSIS — Z853 Personal history of malignant neoplasm of breast: Secondary | ICD-10-CM | POA: Diagnosis not present

## 2017-11-23 LAB — HM MAMMOGRAPHY

## 2017-11-23 MED FILL — LATANOPROST 0.005% EYE DRP: 0.005 | 25 days supply | Qty: 3 | Fill #4

## 2017-12-01 ENCOUNTER — Telehealth: Payer: Self-pay | Admitting: *Deleted

## 2017-12-01 NOTE — Telephone Encounter (Signed)
Received Bone Density results from [facility]; forwarded to provider/SLS 01/31

## 2017-12-12 DIAGNOSIS — H401122 Primary open-angle glaucoma, left eye, moderate stage: Secondary | ICD-10-CM | POA: Diagnosis not present

## 2017-12-12 DIAGNOSIS — H401113 Primary open-angle glaucoma, right eye, severe stage: Secondary | ICD-10-CM | POA: Diagnosis not present

## 2017-12-23 MED FILL — LATANOPROST 0.005% EYE DRP: 0.005 | 25 days supply | Qty: 3 | Fill #5

## 2018-01-16 ENCOUNTER — Encounter: Payer: Self-pay | Admitting: Family Medicine

## 2018-01-17 ENCOUNTER — Other Ambulatory Visit: Payer: Self-pay | Admitting: Family Medicine

## 2018-01-17 DIAGNOSIS — E782 Mixed hyperlipidemia: Secondary | ICD-10-CM

## 2018-01-17 DIAGNOSIS — E118 Type 2 diabetes mellitus with unspecified complications: Secondary | ICD-10-CM

## 2018-01-17 MED FILL — metFORMIN HCL 500 MG TABS: 500 | 90 days supply | Qty: 90 | Fill #0

## 2018-01-17 MED FILL — ROSUVASTATIN CALCIUM 40 MG: 40 | 90 days supply | Qty: 90 | Fill #0

## 2018-01-17 MED FILL — LATANOPROST 0.005% EYE DRP: 0.005 | 25 days supply | Qty: 3 | Fill #6

## 2018-01-24 NOTE — Addendum Note (Signed)
Addended by: Magdalene Molly A on: 01/24/2018 06:19 PM   Modules accepted: Orders

## 2018-01-27 ENCOUNTER — Telehealth: Payer: Self-pay | Admitting: Family Medicine

## 2018-01-27 NOTE — Telephone Encounter (Signed)
Spoke with Priscilla Butler regarding [employer]. Patient declined to schedule appointment at this time. SF

## 2018-02-02 ENCOUNTER — Other Ambulatory Visit (INDEPENDENT_AMBULATORY_CARE_PROVIDER_SITE_OTHER): Payer: PPO

## 2018-02-02 DIAGNOSIS — E782 Mixed hyperlipidemia: Secondary | ICD-10-CM

## 2018-02-02 DIAGNOSIS — E118 Type 2 diabetes mellitus with unspecified complications: Secondary | ICD-10-CM

## 2018-02-02 LAB — HEMOGLOBIN A1C: Hgb A1c MFr Bld: 6.9 % — ABNORMAL HIGH (ref 4.6–6.5)

## 2018-02-02 LAB — TSH: TSH: 2.14 u[IU]/mL (ref 0.35–4.50)

## 2018-02-02 LAB — COMPREHENSIVE METABOLIC PANEL
ALT: 29 U/L (ref 0–35)
AST: 25 U/L (ref 0–37)
Albumin: 4.4 g/dL (ref 3.5–5.2)
Alkaline Phosphatase: 64 U/L (ref 39–117)
BILIRUBIN TOTAL: 0.7 mg/dL (ref 0.2–1.2)
BUN: 20 mg/dL (ref 6–23)
CALCIUM: 9.8 mg/dL (ref 8.4–10.5)
CO2: 27 mEq/L (ref 19–32)
CREATININE: 0.77 mg/dL (ref 0.40–1.20)
Chloride: 103 mEq/L (ref 96–112)
GFR: 79.54 mL/min (ref >60.00)
GLUCOSE: 122 mg/dL — AB (ref 70–99)
Potassium: 3.9 mEq/L (ref 3.5–5.1)
Sodium: 141 mEq/L (ref 135–145)
Total Protein: 7.3 g/dL (ref 6.0–8.3)

## 2018-02-02 LAB — CBC
HCT: 42.4 % (ref 36.0–46.0)
Hemoglobin: 14.4 g/dL (ref 12.0–15.0)
MCHC: 34 g/dL (ref 30.0–36.0)
MCV: 87.5 fl (ref 78.0–100.0)
Platelets: 269 10*3/uL (ref 150.0–400.0)
RBC: 4.85 Mil/uL (ref 3.87–5.11)
RDW: 13.4 % (ref 11.5–15.5)
WBC: 4.9 10*3/uL (ref 4.0–10.5)

## 2018-02-02 LAB — LIPID PANEL
CHOL/HDL RATIO: 3
CHOLESTEROL: 185 mg/dL (ref 0–200)
HDL: 65.4 mg/dL (ref >39.00)
LDL Cholesterol: 97 mg/dL (ref 0–99)
NONHDL: 119.92
Triglycerides: 114 mg/dL (ref 0.0–149.0)
VLDL: 22.8 mg/dL (ref 0.0–40.0)

## 2018-02-06 ENCOUNTER — Ambulatory Visit (INDEPENDENT_AMBULATORY_CARE_PROVIDER_SITE_OTHER): Payer: PPO | Admitting: Family Medicine

## 2018-02-06 DIAGNOSIS — Z23 Encounter for immunization: Secondary | ICD-10-CM

## 2018-02-06 DIAGNOSIS — R030 Elevated blood-pressure reading, without diagnosis of hypertension: Secondary | ICD-10-CM | POA: Diagnosis not present

## 2018-02-06 DIAGNOSIS — E782 Mixed hyperlipidemia: Secondary | ICD-10-CM | POA: Diagnosis not present

## 2018-02-06 DIAGNOSIS — M8580 Other specified disorders of bone density and structure, unspecified site: Secondary | ICD-10-CM

## 2018-02-06 DIAGNOSIS — E118 Type 2 diabetes mellitus with unspecified complications: Secondary | ICD-10-CM

## 2018-02-06 DIAGNOSIS — Z0000 Encounter for general adult medical examination without abnormal findings: Secondary | ICD-10-CM

## 2018-02-06 MED ORDER — PROBIOTIC DAILY PO CAPS: 1.0000 | ORAL_CAPSULE | Freq: Every day | ORAL | Status: AC

## 2018-02-06 MED ORDER — PSYLLIUM 28.3 % PO POWD: ORAL | Status: AC

## 2018-02-06 NOTE — Progress Notes (Signed)
Subjective:  I acted as a Education administrator for Dr. Charlett Blake. [place], [place]  Patient ID: Priscilla Butler, female    DOB: 09-20-1951, 67 y.o.   MRN: [id number]  No chief complaint on file.   HPI  Patient is in today for an annual exam and she feels well today. No recent febrile illness or hospitalizations. No polyuria or polydipsia. Denies CP/palp/SOB/HA/congestion/fevers/GI or GU c/o. Taking meds as prescribed  Patient Care Team: Mosie Lukes, MD as PCP - General (Family Medicine) Ronald Lobo, MD as Consulting Physician (Gastroenterology) Marygrace Drought, MD as Consulting Physician (Ophthalmology) [facility] as Consulting Physician (Dermatology)   Past Medical History:  Diagnosis Date  . Breast cancer ([place]) 01/2008   Stage I, right breast  . 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 DeQuervain's disease (tenosynovitis)   . DM (diabetes mellitus), type 2 ([place])   . H/O measles   . H/O mumps   . History of chicken pox   . Hx of TB skin testing   . Hyperglycemia   . Hyperlipidemia   . Menopause   . Osteopenia   . Otitis externa of left ear 01/30/2017  . Preventative health care 01/19/2016  . Welcome to Medicare preventive visit 01/27/2017    Past Surgical History:  Procedure Laterality Date  . BREAST LUMPECTOMY  03/2008   Right breast  . MOHS SURGERY  2013   BCC right upper lip, [place] skin surgery  . SKIN SURGERY     facial  skin cancer  . WISDOM TOOTH EXTRACTION  1972    Family History  Problem Relation Age of Onset  . Breast cancer Maternal Grandmother   . Cancer Maternal Grandmother 70       ish  . Cancer Maternal Grandfather        lung  . Hypertension Mother   . Hyperlipidemia Mother   . Arthritis Mother   . Other Paternal Grandmother     Social History   Socioeconomic History  . Marital status: Married    Spouse name: [name]  . Number of children: 1  . Years of education: BA  . Highest education level: Not on file  Occupational  History  . Occupation: Retired    Fish farm manager: UNEMPLOYED  Social Needs  . Financial resource strain: Not on file  . Food insecurity:    Worry: Not on file    Inability: Not on file  . Transportation needs:    Medical: Not on file    Non-medical: Not on file  Tobacco Use  . Smoking status: Former Smoker    Last attempt to quit: 11/02/1999    Years since quitting: 18.2  . Smokeless tobacco: Never Used  Substance and Sexual Activity  . Alcohol use: Yes    Alcohol/week: 1.8 oz    Types: 3 Cans of beer per week  . Drug use: No  . Sexual activity: Yes    Partners: Male    Birth control/protection: Post-menopausal    Comment: lives with husband, retired from Press photographer, Therapist, music, No dietary restrictions  Lifestyle  . Physical activity:    Days per week: Not on file    Minutes per session: Not on file  . Stress: Not on file  Relationships  . Social connections:    Talks on phone: Not on file    Gets together: Not on file    Attends religious service: Not on file    Active member of club or organization: Not on file  Attends meetings of clubs or organizations: Not on file    Relationship status: Not on file  . Intimate partner violence:    Fear of current or ex partner: Not on file    Emotionally abused: Not on file    Physically abused: Not on file    Forced sexual activity: Not on file  Other Topics Concern  . Not on file  Social History Narrative  . Not on file    Outpatient Medications Prior to Visit  Medication Sig Dispense Refill  . aspirin 81 MG tablet Take 81 mg by mouth daily.      Marland Kitchen CALCIUM CITRATE-VITAMIN D PO Take 1 tablet by mouth 2 (two) times daily. 600-500 mg     . Cholecalciferol (VITAMIN D3) 1000 UNITS CAPS Take 1 capsule by mouth daily.     Marland Kitchen CRANBERRY PO Take 84 mg by mouth daily.    Marland Kitchen latanoprost (XALATAN) 0.005 % ophthalmic solution   99  . metFORMIN (GLUCOPHAGE) 500 MG tablet TAKE 1 TABLET BY MOUTH DAILY WITH BREAKFAST. 90 tablet 0  .  Multiple Vitamin (MULTIVITAMIN) tablet Take 1 tablet by mouth daily.      . NEOMYCIN-POLYMYXIN-HYDROCORTISONE (CORTISPORIN) 1 % SOLN OTIC solution Place 5 drops into the left ear 2 (two) times daily. 10 mL 0  . Omega-3 Fatty Acids (FISH OIL) 1000 MG CAPS Take 1 capsule by mouth 2 (two) times daily.      . ONE TOUCH ULTRA TEST test strip USE TO TEST BLOOD SUGAR TWICE A DAY AS DIRECTED 100 each 12  . rosuvastatin (CRESTOR) 40 MG tablet TAKE 1 TABLET (40 MG TOTAL) BY MOUTH DAILY. 90 tablet 1  . TURMERIC PO Take 450 mg by mouth 2 (two) times daily.     No facility-administered medications prior to visit.     No Known Allergies  Review of Systems  Constitutional: Negative for fever and malaise/fatigue.  HENT: Negative for congestion.   Eyes: Negative for blurred vision.  Respiratory: Negative for shortness of breath.   Cardiovascular: Negative for chest pain, palpitations and leg swelling.  Gastrointestinal: Negative for abdominal pain, blood in stool and nausea.  Genitourinary: Negative for dysuria and frequency.  Musculoskeletal: Negative for falls.  Skin: Positive for itching. Negative for rash.  Neurological: Negative for dizziness, loss of consciousness and headaches.  Endo/Heme/Allergies: Negative for environmental allergies.  Psychiatric/Behavioral: Negative for depression. The patient is not nervous/anxious.        Objective:    Physical Exam  Constitutional: She is oriented to person, place, and time. She appears well-developed and well-nourished. No distress.  HENT:  Head: Normocephalic and atraumatic.  Eyes: Conjunctivae are normal.  Neck: Neck supple. No thyromegaly present.  Cardiovascular: Normal rate, regular rhythm and normal heart sounds.  No murmur heard. Pulmonary/Chest: Effort normal and breath sounds normal. No respiratory distress.  Abdominal: Soft. Bowel sounds are normal. She exhibits no distension and no mass. There is no tenderness.  Musculoskeletal: She  exhibits no edema.  Lymphadenopathy:    She has no cervical adenopathy.  Neurological: She is alert and oriented to person, place, and time.  Skin: Skin is warm and dry.  Psychiatric: She has a normal mood and affect. Her behavior is normal.    There were no vitals taken for this visit. Wt Readings from Last 3 Encounters:  07/28/17 162 lb (73.5 kg)  01/27/17 167 lb 6.4 oz (75.9 kg)  07/22/16 164 lb 12.8 oz (74.8 kg)   BP Readings from Last  3 Encounters:  07/28/17 139/87  01/27/17 135/80  07/22/16 116/74     Immunization History  Administered Date(s) Administered  . Hepatitis A 12/29/2012  . Hepatitis A, Adult 07/25/2013  . Influenza Split 08/02/2012  . Influenza, High Dose Seasonal PF 07/22/2016, 07/28/2017  . Influenza,inj,Quad PF,6+ Mos 07/25/2013  . Influenza-Unspecified 08/12/2014, 08/16/2015  . Pneumococcal Conjugate-13 07/22/2016  . Pneumococcal Polysaccharide-23 12/04/2012, 02/06/2018  . Tdap 10/04/2011    Health Maintenance  Topic Date Due  . FOOT EXAM  05/09/1961  . COLON CANCER SCREENING ANNUAL FOBT  04/08/2017  . PNA vac Low Risk Adult (2 of 2 - PPSV23) 12/04/2017  . URINE MICROALBUMIN  01/27/2018  . INFLUENZA VACCINE  06/01/2018  . OPHTHALMOLOGY EXAM  07/27/2018  . HEMOGLOBIN A1C  08/04/2018  . MAMMOGRAM  08/18/2018  . TETANUS/TDAP  10/03/2021  . COLONOSCOPY  04/08/2026  . DEXA SCAN  Completed  . Hepatitis C Screening  Completed    Lab Results  Component Value Date   WBC 4.9 02/02/2018   HGB 14.4 02/02/2018   HCT 42.4 02/02/2018   PLT 269.0 02/02/2018   GLUCOSE 122 (H) 02/02/2018   CHOL 185 02/02/2018   TRIG 114.0 02/02/2018   HDL 65.40 02/02/2018   LDLCALC 97 02/02/2018   ALT 29 02/02/2018   AST 25 02/02/2018   NA 141 02/02/2018   K 3.9 02/02/2018   CL 103 02/02/2018   CREATININE 0.77 02/02/2018   BUN 20 02/02/2018   CO2 27 02/02/2018   TSH 2.14 02/02/2018   HGBA1C 6.9 (H) 02/02/2018   MICROALBUR 2.6 01/27/2017    Lab Results    Component Value Date   TSH 2.14 02/02/2018   Lab Results  Component Value Date   WBC 4.9 02/02/2018   HGB 14.4 02/02/2018   HCT 42.4 02/02/2018   MCV 87.5 02/02/2018   PLT 269.0 02/02/2018   Lab Results  Component Value Date   NA 141 02/02/2018   K 3.9 02/02/2018   CO2 27 02/02/2018   GLUCOSE 122 (H) 02/02/2018   BUN 20 02/02/2018   CREATININE 0.77 02/02/2018   BILITOT 0.7 02/02/2018   ALKPHOS 64 02/02/2018   AST 25 02/02/2018   ALT 29 02/02/2018   PROT 7.3 02/02/2018   ALBUMIN 4.4 02/02/2018   CALCIUM 9.8 02/02/2018   GFR 79.54 02/02/2018   Lab Results  Component Value Date   CHOL 185 02/02/2018   Lab Results  Component Value Date   HDL 65.40 02/02/2018   Lab Results  Component Value Date   LDLCALC 97 02/02/2018   Lab Results  Component Value Date   TRIG 114.0 02/02/2018   Lab Results  Component Value Date   CHOLHDL 3 02/02/2018   Lab Results  Component Value Date   HGBA1C 6.9 (H) 02/02/2018         Assessment & Plan:   Problem List Items Addressed This Visit    Hyperlipidemia    Encouraged heart healthy diet, increase exercise, avoid trans fats, consider a krill oil cap daily      Relevant Orders   NMR, lipoprofile   DM (diabetes mellitus), type 2 ([place])    hgba1c acceptable, minimize simple carbs. Increase exercise as tolerated. Continue current meds      Relevant Orders   Hemoglobin A1c   Comprehensive metabolic panel   TSH   Osteopenia    Encouraged to get adequate exercise, calcium and vitamin d intake      Elevated blood pressure reading  Well controlled, no changes to meds. Encouraged heart healthy diet such as the DASH diet and exercise as tolerated. Check labs prior to next visit      Relevant Orders   CBC   Comprehensive metabolic panel   TSH   Preventative health care    Patient encouraged to maintain heart healthy diet, regular exercise, adequate sleep. Consider daily probiotics. Take medications as prescribed.  Doing well. Advanced Directives in chart. MGM rel of rec done. Given Pneumovax and encouraged to get Shingrix at pharmacy. Will do pap with next year CPE, Dexa scan reviewed. Colonoscopy reviewed from 2017, repeat in 2027         I am having Soleia R. Grover start on PROBIOTIC DAILY and Psyllium. I am also having her maintain her multivitamin, Vitamin D3, aspirin, Fish Oil, TURMERIC PO, CRANBERRY PO, CALCIUM CITRATE-VITAMIN D PO, latanoprost, ONE TOUCH ULTRA TEST, NEOMYCIN-POLYMYXIN-HYDROCORTISONE, rosuvastatin, and metFORMIN.  Meds ordered this encounter  Medications  . Probiotic Product (PROBIOTIC DAILY) CAPS    Sig: Take 1 capsule by mouth daily. [employer]  . Psyllium (METAMUCIL SMOOTH TEXTURE) 28.3 % POWD    Sig: 1 tsp daily in water    CMA served as scribe during this visit. History, Physical and Plan performed by medical provider. Documentation and orders reviewed and attested to.  Penni Homans, MD

## 2018-02-06 NOTE — Assessment & Plan Note (Signed)
hgba1c acceptable, minimize simple carbs. Increase exercise as tolerated. Continue current meds 

## 2018-02-06 NOTE — Assessment & Plan Note (Signed)
Encouraged to get adequate exercise, calcium and vitamin d intake 

## 2018-02-06 NOTE — Assessment & Plan Note (Signed)
Encouraged heart healthy diet, increase exercise, avoid trans fats, consider a krill oil cap daily 

## 2018-02-06 NOTE — Assessment & Plan Note (Signed)
Well controlled, no changes to meds. Encouraged heart healthy diet such as the DASH diet and exercise as tolerated. Check labs prior to next visit

## 2018-02-06 NOTE — Assessment & Plan Note (Addendum)
Patient encouraged to maintain heart healthy diet, regular exercise, adequate sleep. Consider daily probiotics. Take medications as prescribed. Doing well. Advanced Directives in chart. MGM rel of rec done. Given Pneumovax and encouraged to get Shingrix at pharmacy. Will do pap with next year CPE, Dexa scan reviewed. Colonoscopy reviewed from 2017, repeat in 2027

## 2018-02-06 NOTE — Patient Instructions (Signed)
Shingrix 2 shots over 2-6 months at pharmacy Preventive Care 65 Years and Older, Female Preventive care refers to lifestyle choices and visits with your health care provider that can promote health and wellness. What does preventive care include?  A yearly physical exam. This is also called an annual well check.  Dental exams once or twice a year.  Routine eye exams. Ask your health care provider how often you should have your eyes checked.  Personal lifestyle choices, including: ? Daily care of your teeth and gums. ? Regular physical activity. ? Eating a healthy diet. ? Avoiding tobacco and drug use. ? Limiting alcohol use. ? Practicing safe sex. ? Taking low-dose aspirin every day. ? Taking vitamin and mineral supplements as recommended by your health care provider. What happens during an annual well check? The services and screenings done by your health care provider during your annual well check will depend on your age, overall health, lifestyle risk factors, and family history of disease. Counseling Your health care provider may ask you questions about your:  Alcohol use.  Tobacco use.  Drug use.  Emotional well-being.  Home and relationship well-being.  Sexual activity.  Eating habits.  History of falls.  Memory and ability to understand (cognition).  Work and work Statistician.  Reproductive health.  Screening You may have the following tests or measurements:  Height, weight, and BMI.  Blood pressure.  Lipid and cholesterol levels. These may be checked every 5 years, or more frequently if you are over 65 years old.  Skin check.  Lung cancer screening. You may have this screening every year starting at age 33 if you have a 30-pack-year history of smoking and currently smoke or have quit within the past 15 years.  Fecal occult blood test (FOBT) of the stool. You may have this test every year starting at age 40.  Flexible sigmoidoscopy or colonoscopy.  You may have a sigmoidoscopy every 5 years or a colonoscopy every 10 years starting at age 73.  Hepatitis C blood test.  Hepatitis B blood test.  Sexually transmitted disease (STD) testing.  Diabetes screening. This is done by checking your blood sugar (glucose) after you have not eaten for a while (fasting). You may have this done every 1-3 years.  Bone density scan. This is done to screen for osteoporosis. You may have this done starting at age 79.  Mammogram. This may be done every 1-2 years. Talk to your health care provider about how often you should have regular mammograms.  Talk with your health care provider about your test results, treatment options, and if necessary, the need for more tests. Vaccines Your health care provider may recommend certain vaccines, such as:  Influenza vaccine. This is recommended every year.  Tetanus, diphtheria, and acellular pertussis (Tdap, Td) vaccine. You may need a Td booster every 10 years.  Varicella vaccine. You may need this if you have not been vaccinated.  Zoster vaccine. You may need this after age 37.  Measles, mumps, and rubella (MMR) vaccine. You may need at least one dose of MMR if you were born in 1957 or later. You may also need a second dose.  Pneumococcal 13-valent conjugate (PCV13) vaccine. One dose is recommended after age 44.  Pneumococcal polysaccharide (PPSV23) vaccine. One dose is recommended after age 29.  Meningococcal vaccine. You may need this if you have certain conditions.  Hepatitis A vaccine. You may need this if you have certain conditions or if you travel or work in  places where you may be exposed to hepatitis A.  Hepatitis B vaccine. You may need this if you have certain conditions or if you travel or work in places where you may be exposed to hepatitis B.  Haemophilus influenzae type b (Hib) vaccine. You may need this if you have certain conditions.  Talk to your health care provider about which  screenings and vaccines you need and how often you need them. This information is not intended to replace advice given to you by your health care provider. Make sure you discuss any questions you have with your health care provider. Document Released: 11/14/2015 Document Revised: 07/07/2016 Document Reviewed: 08/19/2015 Elsevier Interactive Patient Education  [employer].

## 2018-02-13 DIAGNOSIS — D1801 Hemangioma of skin and subcutaneous tissue: Secondary | ICD-10-CM | POA: Diagnosis not present

## 2018-02-13 DIAGNOSIS — L821 Other seborrheic keratosis: Secondary | ICD-10-CM | POA: Diagnosis not present

## 2018-02-13 DIAGNOSIS — D225 Melanocytic nevi of trunk: Secondary | ICD-10-CM | POA: Diagnosis not present

## 2018-02-13 DIAGNOSIS — L239 Allergic contact dermatitis, unspecified cause: Secondary | ICD-10-CM | POA: Diagnosis not present

## 2018-02-13 DIAGNOSIS — Z85828 Personal history of other malignant neoplasm of skin: Secondary | ICD-10-CM | POA: Diagnosis not present

## 2018-02-13 DIAGNOSIS — L814 Other melanin hyperpigmentation: Secondary | ICD-10-CM | POA: Diagnosis not present

## 2018-02-15 MED FILL — LATANOPROST 0.005% EYE DRP: 0.005 | 25 days supply | Qty: 3 | Fill #7

## 2018-02-20 ENCOUNTER — Encounter: Payer: Self-pay | Admitting: Family Medicine

## 2018-03-14 MED FILL — LATANOPROST 0.005% EYE DRP: 0.005 | 25 days supply | Qty: 3 | Fill #8

## 2018-04-11 ENCOUNTER — Other Ambulatory Visit: Payer: Self-pay | Admitting: Family Medicine

## 2018-04-11 MED FILL — ROSUVASTATIN CALCIUM 40 MG: 40 | 90 days supply | Qty: 90 | Fill #1

## 2018-04-12 MED FILL — metFORMIN HCL 500 MG TABS: 500 | 90 days supply | Qty: 90 | Fill #0

## 2018-04-13 MED FILL — LATANOPROST 0.005% EYE DRP: 0.005 | 25 days supply | Qty: 3 | Fill #9

## 2018-04-17 ENCOUNTER — Ambulatory Visit (INDEPENDENT_AMBULATORY_CARE_PROVIDER_SITE_OTHER): Payer: PPO | Admitting: Medical

## 2018-04-17 ENCOUNTER — Encounter: Payer: Self-pay | Admitting: Medical

## 2018-04-17 VITALS — BP 134/86 | HR 89 | Temp 97.8°F | Resp 16 | Ht 62.0 in | Wt 170.0 lb

## 2018-04-17 DIAGNOSIS — R0981 Nasal congestion: Secondary | ICD-10-CM

## 2018-04-17 DIAGNOSIS — R059 Cough, unspecified: Secondary | ICD-10-CM

## 2018-04-17 DIAGNOSIS — R05 Cough: Secondary | ICD-10-CM | POA: Diagnosis not present

## 2018-04-17 MED ORDER — FLUTICASONE PROPIONATE 50 MCG/ACT NA SUSP: 2.0000 | Freq: Every day | NASAL | 1 refills | Status: DC

## 2018-04-17 MED ORDER — AZITHROMYCIN 250 MG PO TABS: ORAL_TABLET | ORAL | 0 refills | Status: DC

## 2018-04-17 MED ORDER — ALBUTEROL SULFATE HFA 108 (90 BASE) MCG/ACT IN AERS: 2.0000 | INHALATION_SPRAY | Freq: Four times a day (QID) | RESPIRATORY_TRACT | 0 refills | Status: DC | PRN

## 2018-04-17 MED ORDER — LEVOCETIRIZINE DIHYDROCHLORIDE 5 MG PO TABS: 5.0000 mg | ORAL_TABLET | Freq: Every evening | ORAL | 0 refills | Status: DC

## 2018-04-17 MED FILL — FLUTICASONE PROP 50 MCG SPR: 50 | 30 days supply | Qty: 16 | Fill #0

## 2018-04-17 MED FILL — LEVOCETIRIZINE 5 MG TABLET: 5 | 30 days supply | Qty: 30 | Fill #0

## 2018-04-17 NOTE — Progress Notes (Signed)
Subjective:    Patient ID: Priscilla Butler, female    DOB: 1951-01-30, 67 y.o.   MRN: [id number]  HPI  Pt in with nasal and chest congestion for about a week. Pt recently had low grade fever over the weekend. She states no fever since yesterday.   Pt trying to cough up mucus but can't.  Early on some sneezing but no itching eyes.  Pt has tried tylenol and ibuprofen. Also tried mucinex otc. Symptoms are persisting. She is about to go out of town to [facility] this Wednesday.     Review of Systems  Constitutional: Negative for chills, fatigue and fever.  HENT: Positive for congestion and postnasal drip. Negative for rhinorrhea.   Respiratory: Positive for cough. Negative for choking, chest tightness and wheezing.   Cardiovascular: Negative for chest pain and palpitations.  Gastrointestinal: Negative for abdominal pain.  Musculoskeletal: Negative for back pain.  Skin: Negative for color change and rash.  Neurological: Negative for dizziness and headaches.  Hematological: Negative for adenopathy. Does not bruise/bleed easily.  Psychiatric/Behavioral: Negative for behavioral problems and confusion.    Past Medical History:  Diagnosis Date  . Breast cancer ([place]) 01/2008   Stage I, right breast  . 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 DeQuervain's disease (tenosynovitis)   . DM (diabetes mellitus), type 2 ([place])   . H/O measles   . H/O mumps   . History of chicken pox   . Hx of TB skin testing   . Hyperglycemia   . Hyperlipidemia   . Menopause   . Osteopenia   . Otitis externa of left ear 01/30/2017  . Preventative health care 01/19/2016  . Welcome to Medicare preventive visit 01/27/2017     Social History   Socioeconomic History  . Marital status: Married    Spouse name: [name]  . Number of children: 1  . Years of education: BA  . Highest education level: Not on file  Occupational History  . Occupation: Retired    Fish farm manager: UNEMPLOYED  Social Needs    . Financial resource strain: Not on file  . Food insecurity:    Worry: Not on file    Inability: Not on file  . Transportation needs:    Medical: Not on file    Non-medical: Not on file  Tobacco Use  . Smoking status: Former Smoker    Last attempt to quit: 11/02/1999    Years since quitting: 18.4  . Smokeless tobacco: Never Used  Substance and Sexual Activity  . Alcohol use: Yes    Alcohol/week: 1.8 oz    Types: 3 Cans of beer per week  . Drug use: No  . Sexual activity: Yes    Partners: Male    Birth control/protection: Post-menopausal    Comment: lives with husband, retired from Press photographer, Therapist, music, No dietary restrictions  Lifestyle  . Physical activity:    Days per week: Not on file    Minutes per session: Not on file  . Stress: Not on file  Relationships  . Social connections:    Talks on phone: Not on file    Gets together: Not on file    Attends religious service: Not on file    Active member of club or organization: Not on file    Attends meetings of clubs or organizations: Not on file    Relationship status: Not on file  . Intimate partner violence:    Fear of current or ex partner: Not on  file    Emotionally abused: Not on file    Physically abused: Not on file    Forced sexual activity: Not on file  Other Topics Concern  . Not on file  Social History Narrative  . Not on file    Past Surgical History:  Procedure Laterality Date  . BREAST LUMPECTOMY  03/2008   Right breast  . MOHS SURGERY  2013   BCC right upper lip, [place] skin surgery  . SKIN SURGERY     facial  skin cancer  . WISDOM TOOTH EXTRACTION  1972    Family History  Problem Relation Age of Onset  . Breast cancer Maternal Grandmother   . Cancer Maternal Grandmother 34       ish  . Cancer Maternal Grandfather        lung  . Hypertension Mother   . Hyperlipidemia Mother   . Arthritis Mother   . Other Paternal Grandmother     No Known Allergies  Current Outpatient  Medications on File Prior to Visit  Medication Sig Dispense Refill  . aspirin 81 MG tablet Take 81 mg by mouth daily.      Marland Kitchen CALCIUM CITRATE-VITAMIN D PO Take 1 tablet by mouth 2 (two) times daily. 600-500 mg     . Cholecalciferol (VITAMIN D3) 1000 UNITS CAPS Take 1 capsule by mouth daily.     Marland Kitchen CRANBERRY PO Take 84 mg by mouth daily.    Marland Kitchen latanoprost (XALATAN) 0.005 % ophthalmic solution   99  . metFORMIN (GLUCOPHAGE) 500 MG tablet TAKE 1 TABLET BY MOUTH DAILY WITH BREAKFAST. 90 tablet 0  . Multiple Vitamin (MULTIVITAMIN) tablet Take 1 tablet by mouth daily.      . NEOMYCIN-POLYMYXIN-HYDROCORTISONE (CORTISPORIN) 1 % SOLN OTIC solution Place 5 drops into the left ear 2 (two) times daily. 10 mL 0  . Omega-3 Fatty Acids (FISH OIL) 1000 MG CAPS Take 1 capsule by mouth 2 (two) times daily.      . ONE TOUCH ULTRA TEST test strip USE TO TEST BLOOD SUGAR TWICE A DAY AS DIRECTED 100 each 12  . Probiotic Product (PROBIOTIC DAILY) CAPS Take 1 capsule by mouth daily. NOW company    . Psyllium (METAMUCIL SMOOTH TEXTURE) 28.3 % POWD 1 tsp daily in water    . rosuvastatin (CRESTOR) 40 MG tablet TAKE 1 TABLET (40 MG TOTAL) BY MOUTH DAILY. 90 tablet 1  . TURMERIC PO Take 450 mg by mouth 2 (two) times daily.     No current facility-administered medications on file prior to visit.     BP 134/86   Pulse 89   Temp 97.8 F (36.6 C) (Oral)   Resp 16   Ht 5\' 2"  (1.575 m)   Wt 170 lb (77.1 kg)   SpO2 98%   BMI 31.09 kg/m       Objective:   Physical Exam  General  Mental Status - Alert. General Appearance - Well groomed. Not in acute distress.  Skin Rashes- No Rashes.  HEENT Head- Normal. Ear Auditory Canal - Left- Normal. Right - Normal.Tympanic Membrane- Left- Normal. Right- Normal. Eye Sclera/Conjunctiva- Left- Normal. Right- Normal. Nose & Sinuses Nasal Mucosa- Left-  Boggy and Congested. Right-  Boggy and  Congested.Bilateral no  maxillary and no  frontal sinus pressure. Mouth &  Throat Lips: Upper Lip- Normal: no dryness, cracking, pallor, cyanosis, or vesicular eruption. Lower Lip-Normal: no dryness, cracking, pallor, cyanosis or vesicular eruption. Buccal Mucosa- Bilateral- No Aphthous ulcers. Oropharynx- No Discharge  or Erythema. +pnd Tonsils: Characteristics- Bilateral- No Erythema or Congestion. Size/Enlargement- Bilateral- No enlargement. Discharge- bilateral-None.  Neck Neck- Supple. No Masses.   Chest and Lung Exam Auscultation: Breath Sounds:-Clear even and unlabored.  Cardiovascular Auscultation:Rythm- Regular, rate and rhythm. Murmurs & Other Heart Sounds:Ausculatation of the heart reveal- No Murmurs.  Lymphatic Head & Neck General Head & Neck Lymphatics: Bilateral: Description- No Localized lymphadenopathy.       Assessment & Plan:  Suspect mild allergic rhinitis and appear to be improving presently. Recommend xyzal and flonase.  If symptoms worsens as discussed before you travel then recommend going ahead and getting azithromycin antibiotic filled.  Also if any recurrent mild wheeze use albuterol inhaler.  Follow up in 7-10 days or as needed  [employer], [employer]

## 2018-04-17 NOTE — Patient Instructions (Addendum)
Suspect  mild allergic rhinitis and appear to be improving presently. Recommend xyzal and flonase.  If symptoms worsens as discussed before you travel then recommend going ahead and getting azithromycin antibiotic filled.  Also if any recurrent mild wheeze use albuterol inhaler.   Follow up in 7-10 days or as needed

## 2018-05-24 DIAGNOSIS — E119 Type 2 diabetes mellitus without complications: Secondary | ICD-10-CM | POA: Diagnosis not present

## 2018-05-24 DIAGNOSIS — H2513 Age-related nuclear cataract, bilateral: Secondary | ICD-10-CM | POA: Diagnosis not present

## 2018-05-24 DIAGNOSIS — H401113 Primary open-angle glaucoma, right eye, severe stage: Secondary | ICD-10-CM | POA: Diagnosis not present

## 2018-05-24 DIAGNOSIS — H401122 Primary open-angle glaucoma, left eye, moderate stage: Secondary | ICD-10-CM | POA: Diagnosis not present

## 2018-06-07 MED FILL — LATANOPROST 0.005% EYE DRP: 0.005 | 25 days supply | Qty: 3 | Fill #10

## 2018-06-08 ENCOUNTER — Other Ambulatory Visit: Payer: Self-pay | Admitting: Family Medicine

## 2018-06-09 MED FILL — ONE TOUCH ULTRA TEST STRIPS: 50 days supply | Qty: 100 | Fill #0

## 2018-06-30 MED FILL — LATANOPROST 0.005% EYE DRP: 0.005 | 25 days supply | Qty: 3 | Fill #0

## 2018-07-11 ENCOUNTER — Other Ambulatory Visit: Payer: Self-pay | Admitting: Family Medicine

## 2018-07-12 MED FILL — metFORMIN HCL 500 MG TABS: 500 | 90 days supply | Qty: 90 | Fill #0

## 2018-07-12 MED FILL — ROSUVASTATIN CALCIUM 40 MG: 40 | 90 days supply | Qty: 90 | Fill #0

## 2018-08-02 MED FILL — LATANOPROST 0.005% EYE DRP: 0.005 | 25 days supply | Qty: 3 | Fill #1

## 2018-08-07 ENCOUNTER — Other Ambulatory Visit: Payer: PPO

## 2018-08-07 ENCOUNTER — Other Ambulatory Visit (INDEPENDENT_AMBULATORY_CARE_PROVIDER_SITE_OTHER): Payer: PPO

## 2018-08-07 DIAGNOSIS — E118 Type 2 diabetes mellitus with unspecified complications: Secondary | ICD-10-CM

## 2018-08-07 DIAGNOSIS — E782 Mixed hyperlipidemia: Secondary | ICD-10-CM

## 2018-08-07 DIAGNOSIS — R030 Elevated blood-pressure reading, without diagnosis of hypertension: Secondary | ICD-10-CM | POA: Diagnosis not present

## 2018-08-07 LAB — COMPREHENSIVE METABOLIC PANEL
ALT: 30 U/L (ref 0–35)
AST: 26 U/L (ref 0–37)
Albumin: 4.6 g/dL (ref 3.5–5.2)
Alkaline Phosphatase: 66 U/L (ref 39–117)
BUN: 19 mg/dL (ref 6–23)
CALCIUM: 9.7 mg/dL (ref 8.4–10.5)
CHLORIDE: 100 meq/L (ref 96–112)
CO2: 29 meq/L (ref 19–32)
Creatinine, Ser: 0.86 mg/dL (ref 0.40–1.20)
GFR: 69.9 mL/min (ref >60.00)
Glucose, Bld: 142 mg/dL — ABNORMAL HIGH (ref 70–99)
POTASSIUM: 4.3 meq/L (ref 3.5–5.1)
SODIUM: 137 meq/L (ref 135–145)
Total Bilirubin: 1.1 mg/dL (ref 0.2–1.2)
Total Protein: 7 g/dL (ref 6.0–8.3)

## 2018-08-07 LAB — HEMOGLOBIN A1C: Hgb A1c MFr Bld: 7.1 % — ABNORMAL HIGH (ref 4.6–6.5)

## 2018-08-07 LAB — CBC
HEMATOCRIT: 41.1 % (ref 36.0–46.0)
HEMOGLOBIN: 14 g/dL (ref 12.0–15.0)
MCHC: 34 g/dL (ref 30.0–36.0)
MCV: 86.1 fl (ref 78.0–100.0)
PLATELETS: 255 10*3/uL (ref 150.0–400.0)
RBC: 4.77 Mil/uL (ref 3.87–5.11)
RDW: 13.6 % (ref 11.5–15.5)
WBC: 5.7 10*3/uL (ref 4.0–10.5)

## 2018-08-07 LAB — TSH: TSH: 2.2 u[IU]/mL (ref 0.35–4.50)

## 2018-08-07 NOTE — Addendum Note (Signed)
Addended by: Harl Bowie on: 08/07/2018 10:35 AM   Modules accepted: Orders

## 2018-08-08 LAB — NMR, LIPOPROFILE
CHOLESTEROL, TOTAL: 173 mg/dL (ref 100–199)
HDL Particle Number: 46.9 umol/L (ref >=30.5)
HDL-C: 73 mg/dL (ref >39)
LDL PARTICLE NUMBER: 1052 nmol/L — AB (ref <1000)
LDL SIZE: 20.8 nm (ref >20.5)
LDL-C: 78 mg/dL (ref 0–99)
LP-IR Score: 64 — ABNORMAL HIGH (ref <=45)
Small LDL Particle Number: 560 nmol/L — ABNORMAL HIGH (ref <=527)
TRIGLYCERIDES: 111 mg/dL (ref 0–149)

## 2018-08-09 ENCOUNTER — Telehealth: Payer: Self-pay | Admitting: *Deleted

## 2018-08-09 NOTE — Telephone Encounter (Signed)
Received Lab Report results from [facility]; forwarded to provider/SLS 10/09

## 2018-08-10 ENCOUNTER — Ambulatory Visit (INDEPENDENT_AMBULATORY_CARE_PROVIDER_SITE_OTHER): Payer: PPO | Admitting: Family Medicine

## 2018-08-10 ENCOUNTER — Encounter: Payer: Self-pay | Admitting: Family Medicine

## 2018-08-10 DIAGNOSIS — M8580 Other specified disorders of bone density and structure, unspecified site: Secondary | ICD-10-CM | POA: Diagnosis not present

## 2018-08-10 DIAGNOSIS — E119 Type 2 diabetes mellitus without complications: Secondary | ICD-10-CM | POA: Diagnosis not present

## 2018-08-10 DIAGNOSIS — E782 Mixed hyperlipidemia: Secondary | ICD-10-CM

## 2018-08-10 NOTE — Patient Instructions (Signed)

## 2018-08-10 NOTE — Assessment & Plan Note (Signed)
Encouraged heart healthy diet, increase exercise, avoid trans fats, consider a krill oil cap daily 

## 2018-08-10 NOTE — Assessment & Plan Note (Signed)
hgba1c acceptable, minimize simple carbs. Increase exercise as tolerated.  

## 2018-08-11 ENCOUNTER — Encounter: Payer: Self-pay | Admitting: Family Medicine

## 2018-08-11 MED ORDER — ZOSTER VAC RECOMB ADJUVANTED 50 MCG/0.5ML IM SUSR: 0.5000 mL | Freq: Once | INTRAMUSCULAR | 1 refills | Status: AC

## 2018-08-13 NOTE — Progress Notes (Signed)
Subjective:    Patient ID: Priscilla Butler, female    DOB: [birth year], 67 y.o.   MRN: [id number]  No chief complaint on file.   HPI Patient is in today for follow up. She feels well today. No recent febrile illness or hospitalization. No polyuria or polydipsia. Sugars are well controlled. She continues to stay active and maintain low cab diet on most days. Denies CP/palp/SOB/HA/congestion/fevers/GI or GU c/o. Taking meds as prescribed  Past Medical History:  Diagnosis Date  . Breast cancer ([place]) 01/2008   Stage I, right breast  . 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 DeQuervain's disease (tenosynovitis)   . DM (diabetes mellitus), type 2 ([place])   . H/O measles   . H/O mumps   . History of chicken pox   . Hx of TB skin testing   . Hyperglycemia   . Hyperlipidemia   . Menopause   . Osteopenia   . Otitis externa of left ear 01/30/2017  . Preventative health care 01/19/2016  . Welcome to Medicare preventive visit 01/27/2017    Past Surgical History:  Procedure Laterality Date  . BREAST LUMPECTOMY  03/2008   Right breast  . MOHS SURGERY  2013   BCC right upper lip, [place] skin surgery  . SKIN SURGERY     facial  skin cancer  . WISDOM TOOTH EXTRACTION  1972    Family History  Problem Relation Age of Onset  . Breast cancer Maternal Grandmother   . Cancer Maternal Grandmother 25       ish  . Cancer Maternal Grandfather        lung  . Hypertension Mother   . Hyperlipidemia Mother   . Arthritis Mother   . Other Paternal Grandmother     Social History   Socioeconomic History  . Marital status: Married    Spouse name: [name]  . Number of children: 1  . Years of education: BA  . Highest education level: Not on file  Occupational History  . Occupation: Retired    Fish farm manager: UNEMPLOYED  Social Needs  . Financial resource strain: Not on file  . Food insecurity:    Worry: Not on file    Inability: Not on file  . Transportation needs:    Medical:  Not on file    Non-medical: Not on file  Tobacco Use  . Smoking status: Former Smoker    Last attempt to quit: 11/02/1999    Years since quitting: 18.7  . Smokeless tobacco: Never Used  Substance and Sexual Activity  . Alcohol use: Yes    Alcohol/week: 3.0 standard drinks    Types: 3 Cans of beer per week  . Drug use: No  . Sexual activity: Yes    Partners: Male    Birth control/protection: Post-menopausal    Comment: lives with husband, retired from Press photographer, Therapist, music, No dietary restrictions  Lifestyle  . Physical activity:    Days per week: Not on file    Minutes per session: Not on file  . Stress: Not on file  Relationships  . Social connections:    Talks on phone: Not on file    Gets together: Not on file    Attends religious service: Not on file    Active member of club or organization: Not on file    Attends meetings of clubs or organizations: Not on file    Relationship status: Not on file  . Intimate partner violence:    Fear of  current or ex partner: Not on file    Emotionally abused: Not on file    Physically abused: Not on file    Forced sexual activity: Not on file  Other Topics Concern  . Not on file  Social History Narrative  . Not on file    Outpatient Medications Prior to Visit  Medication Sig Dispense Refill  . aspirin 81 MG tablet Take 81 mg by mouth daily.      Marland Kitchen CALCIUM CITRATE-VITAMIN D PO Take 1 tablet by mouth 2 (two) times daily. 600-500 mg     . Cholecalciferol (VITAMIN D3) 1000 UNITS CAPS Take 1 capsule by mouth daily.     Marland Kitchen CRANBERRY PO Take 84 mg by mouth daily.    Marland Kitchen latanoprost (XALATAN) 0.005 % ophthalmic solution   99  . metFORMIN (GLUCOPHAGE) 500 MG tablet TAKE 1 TABLET BY MOUTH DAILY WITH BREAKFAST. 90 tablet 1  . Multiple Vitamin (MULTIVITAMIN) tablet Take 1 tablet by mouth daily.      . NEOMYCIN-POLYMYXIN-HYDROCORTISONE (CORTISPORIN) 1 % SOLN OTIC solution Place 5 drops into the left ear 2 (two) times daily. 10 mL 0  .  Omega-3 Fatty Acids (FISH OIL) 1000 MG CAPS Take 1 capsule by mouth 2 (two) times daily.      . ONE TOUCH ULTRA TEST test strip USE TO TEST BLOOD SUGAR TWICE A DAY AS DIRECTED 100 each 1  . Probiotic Product (PROBIOTIC DAILY) CAPS Take 1 capsule by mouth daily. NOW company    . Psyllium (METAMUCIL SMOOTH TEXTURE) 28.3 % POWD 1 tsp daily in water    . rosuvastatin (CRESTOR) 40 MG tablet TAKE 1 TABLET (40 MG TOTAL) BY MOUTH DAILY. 90 tablet 1  . TURMERIC PO Take 450 mg by mouth 2 (two) times daily.    Marland Kitchen albuterol (PROVENTIL HFA;VENTOLIN HFA) 108 (90 Base) MCG/ACT inhaler Inhale 2 puffs into the lungs every 6 (six) hours as needed for wheezing or shortness of breath. 1 Inhaler 0  . azithromycin (ZITHROMAX) 250 MG tablet Take 2 tablets by mouth on day 1, followed by 1 tablet by mouth daily for 4 days.Take 2 tablets by mouth on day 1, followed by 1 tablet by mouth daily for 4 days. 6 tablet 0  . fluticasone (FLONASE) 50 MCG/ACT nasal spray Place 2 sprays into both nostrils daily. 16 g 1  . levocetirizine (XYZAL) 5 MG tablet Take 1 tablet (5 mg total) by mouth every evening. 30 tablet 0   No facility-administered medications prior to visit.     No Known Allergies  Review of Systems  Constitutional: Negative for fever and malaise/fatigue.  HENT: Negative for congestion.   Eyes: Negative for blurred vision.  Respiratory: Negative for shortness of breath.   Cardiovascular: Negative for chest pain, palpitations and leg swelling.  Gastrointestinal: Negative for abdominal pain, blood in stool and nausea.  Genitourinary: Negative for dysuria and frequency.  Musculoskeletal: Negative for falls.  Skin: Negative for rash.  Neurological: Negative for dizziness, loss of consciousness and headaches.  Endo/Heme/Allergies: Negative for environmental allergies.  Psychiatric/Behavioral: Negative for depression. The patient is not nervous/anxious.        Objective:    Physical Exam  Constitutional: She is  oriented to person, place, and time. She appears well-developed and well-nourished. No distress.  HENT:  Head: Normocephalic and atraumatic.  Nose: Nose normal.  Eyes: Right eye exhibits no discharge. Left eye exhibits no discharge.  Neck: Normal range of motion. Neck supple.  Cardiovascular: Normal rate and  regular rhythm.  No murmur heard. Pulmonary/Chest: Effort normal and breath sounds normal.  Abdominal: Soft. Bowel sounds are normal. There is no tenderness.  Musculoskeletal: She exhibits no edema.  Neurological: She is alert and oriented to person, place, and time.  Skin: Skin is warm and dry.  Psychiatric: She has a normal mood and affect.  Nursing note and vitals reviewed.   BP 132/84 (BP Location: Left Arm, Patient Position: Sitting, Cuff Size: Normal)   Pulse 95   Temp 98.2 F (36.8 C) (Oral)   Resp 18   Ht 5\' 2"  (1.575 m)   Wt 170 lb 6.4 oz (77.3 kg)   SpO2 98%   BMI 31.17 kg/m  Wt Readings from Last 3 Encounters:  08/10/18 170 lb 6.4 oz (77.3 kg)  04/17/18 170 lb (77.1 kg)  07/28/17 162 lb (73.5 kg)     Lab Results  Component Value Date   WBC 5.7 08/07/2018   HGB 14.0 08/07/2018   HCT 41.1 08/07/2018   PLT 255.0 08/07/2018   GLUCOSE 142 (H) 08/07/2018   CHOL 185 02/02/2018   TRIG 114.0 02/02/2018   HDL 65.40 02/02/2018   LDLCALC 97 02/02/2018   ALT 30 08/07/2018   AST 26 08/07/2018   NA 137 08/07/2018   K 4.3 08/07/2018   CL 100 08/07/2018   CREATININE 0.86 08/07/2018   BUN 19 08/07/2018   CO2 29 08/07/2018   TSH 2.20 08/07/2018   HGBA1C 7.1 (H) 08/07/2018   MICROALBUR 2.6 01/27/2017    Lab Results  Component Value Date   TSH 2.20 08/07/2018   Lab Results  Component Value Date   WBC 5.7 08/07/2018   HGB 14.0 08/07/2018   HCT 41.1 08/07/2018   MCV 86.1 08/07/2018   PLT 255.0 08/07/2018   Lab Results  Component Value Date   NA 137 08/07/2018   K 4.3 08/07/2018   CO2 29 08/07/2018   GLUCOSE 142 (H) 08/07/2018   BUN 19 08/07/2018    CREATININE 0.86 08/07/2018   BILITOT 1.1 08/07/2018   ALKPHOS 66 08/07/2018   AST 26 08/07/2018   ALT 30 08/07/2018   PROT 7.0 08/07/2018   ALBUMIN 4.6 08/07/2018   CALCIUM 9.7 08/07/2018   GFR 69.90 08/07/2018   Lab Results  Component Value Date   CHOL 185 02/02/2018   Lab Results  Component Value Date   HDL 65.40 02/02/2018   Lab Results  Component Value Date   LDLCALC 97 02/02/2018   Lab Results  Component Value Date   TRIG 114.0 02/02/2018   Lab Results  Component Value Date   CHOLHDL 3 02/02/2018   Lab Results  Component Value Date   HGBA1C 7.1 (H) 08/07/2018       Assessment & Plan:   Problem List Items Addressed This Visit    Hyperlipidemia    Encouraged heart healthy diet, increase exercise, avoid trans fats, consider a krill oil cap daily      Relevant Orders   Lipid panel   TSH   DM (diabetes mellitus), type 2 (HCC)    hgba1c acceptable, minimize simple carbs. Increase exercise as tolerated.       Relevant Orders   Hemoglobin A1c   Comprehensive metabolic panel   TSH   Osteopenia    Encouraged to get adequate exercise, calcium and vitamin d intake         I have discontinued Emmalyne R. Carandang's fluticasone, levocetirizine, azithromycin, and albuterol. I am also having her maintain her multivitamin,  Vitamin D3, aspirin, Fish Oil, TURMERIC PO, CRANBERRY PO, CALCIUM CITRATE-VITAMIN D PO, latanoprost, NEOMYCIN-POLYMYXIN-HYDROCORTISONE, PROBIOTIC DAILY, Psyllium, ONE TOUCH ULTRA TEST, rosuvastatin, and metFORMIN.  No orders of the defined types were placed in this encounter.    Penni Homans, MD

## 2018-08-13 NOTE — Assessment & Plan Note (Signed)
Encouraged to get adequate exercise, calcium and vitamin d intake 

## 2018-08-22 MED FILL — FLUOCINOLONE ACETONIDE 0.01: 0.01 | 50 days supply | Qty: 20 | Fill #0

## 2018-09-06 MED FILL — LATANOPROST 0.005% EYE DRP: 0.005 | 25 days supply | Qty: 3 | Fill #2

## 2018-09-25 DIAGNOSIS — H401122 Primary open-angle glaucoma, left eye, moderate stage: Secondary | ICD-10-CM | POA: Diagnosis not present

## 2018-09-25 DIAGNOSIS — H401113 Primary open-angle glaucoma, right eye, severe stage: Secondary | ICD-10-CM | POA: Diagnosis not present

## 2018-09-27 MED FILL — SHINGRIX 50 MCG SUS: 50 | 1 days supply | Qty: 1 | Fill #0

## 2018-10-03 MED FILL — LATANOPROST 0.005% EYE DRP: 0.005 | 25 days supply | Qty: 3 | Fill #3

## 2018-10-05 MED FILL — metFORMIN HCL 500 MG TABS: 500 | 90 days supply | Qty: 90 | Fill #1

## 2018-11-02 MED FILL — ROSUVASTATIN CALCIUM 40 MG: 40 | 90 days supply | Qty: 90 | Fill #1

## 2018-11-02 MED FILL — LATANOPROST 0.005% EYE DRP: 0.005 | 25 days supply | Qty: 3 | Fill #4

## 2018-11-08 ENCOUNTER — Other Ambulatory Visit (INDEPENDENT_AMBULATORY_CARE_PROVIDER_SITE_OTHER): Payer: PPO

## 2018-11-08 DIAGNOSIS — E782 Mixed hyperlipidemia: Secondary | ICD-10-CM | POA: Diagnosis not present

## 2018-11-08 DIAGNOSIS — E119 Type 2 diabetes mellitus without complications: Secondary | ICD-10-CM | POA: Diagnosis not present

## 2018-11-08 LAB — COMPREHENSIVE METABOLIC PANEL
ALK PHOS: 56 U/L (ref 39–117)
ALT: 24 U/L (ref 0–35)
AST: 19 U/L (ref 0–37)
Albumin: 4.6 g/dL (ref 3.5–5.2)
BILIRUBIN TOTAL: 1.1 mg/dL (ref 0.2–1.2)
BUN: 17 mg/dL (ref 6–23)
CO2: 28 meq/L (ref 19–32)
Calcium: 10.1 mg/dL (ref 8.4–10.5)
Chloride: 103 mEq/L (ref 96–112)
Creatinine, Ser: 0.79 mg/dL (ref 0.40–1.20)
GFR: 77.04 mL/min (ref >60.00)
GLUCOSE: 132 mg/dL — AB (ref 70–99)
Potassium: 4.6 mEq/L (ref 3.5–5.1)
SODIUM: 138 meq/L (ref 135–145)
Total Protein: 6.8 g/dL (ref 6.0–8.3)

## 2018-11-08 LAB — LIPID PANEL
CHOL/HDL RATIO: 3
Cholesterol: 153 mg/dL (ref 0–200)
HDL: 56.8 mg/dL (ref >39.00)
LDL Cholesterol: 77 mg/dL (ref 0–99)
NONHDL: 96.61
Triglycerides: 100 mg/dL (ref 0.0–149.0)
VLDL: 20 mg/dL (ref 0.0–40.0)

## 2018-11-08 LAB — TSH: TSH: 2.27 u[IU]/mL (ref 0.35–4.50)

## 2018-11-08 LAB — HEMOGLOBIN A1C: Hgb A1c MFr Bld: 7.3 % — ABNORMAL HIGH (ref 4.6–6.5)

## 2018-11-14 ENCOUNTER — Ambulatory Visit (INDEPENDENT_AMBULATORY_CARE_PROVIDER_SITE_OTHER): Payer: PPO | Admitting: Family Medicine

## 2018-11-14 VITALS — BP 124/76 | HR 99 | Temp 98.2°F | Resp 18 | Wt 164.8 lb

## 2018-11-14 DIAGNOSIS — I10 Essential (primary) hypertension: Secondary | ICD-10-CM | POA: Diagnosis not present

## 2018-11-14 DIAGNOSIS — E119 Type 2 diabetes mellitus without complications: Secondary | ICD-10-CM

## 2018-11-14 DIAGNOSIS — M8580 Other specified disorders of bone density and structure, unspecified site: Secondary | ICD-10-CM | POA: Diagnosis not present

## 2018-11-14 DIAGNOSIS — E782 Mixed hyperlipidemia: Secondary | ICD-10-CM

## 2018-11-14 NOTE — Patient Instructions (Signed)
Shingrix 2nd shot sometime between 11/27/2018 and 03/28/2019 at the pharmacy.  Diabetes Mellitus and Nutrition, Adult When you have diabetes (diabetes mellitus), it is very important to have healthy eating habits because your blood sugar (glucose) levels are greatly affected by what you eat and drink. Eating healthy foods in the appropriate amounts, at about the same times every day, can help you:  Control your blood glucose.  Lower your risk of heart disease.  Improve your blood pressure.  Reach or maintain a healthy weight. Every person with diabetes is different, and each person has different needs for a meal plan. Your health care provider may recommend that you work with a diet and nutrition specialist (dietitian) to make a meal plan that is best for you. Your meal plan may vary depending on factors such as:  The calories you need.  The medicines you take.  Your weight.  Your blood glucose, blood pressure, and cholesterol levels.  Your activity level.  Other health conditions you have, such as heart or kidney disease. How do carbohydrates affect me? Carbohydrates, also called carbs, affect your blood glucose level more than any other type of food. Eating carbs naturally raises the amount of glucose in your blood. Carb counting is a method for keeping track of how many carbs you eat. Counting carbs is important to keep your blood glucose at a healthy level, especially if you use insulin or take certain oral diabetes medicines. It is important to know how many carbs you can safely have in each meal. This is different for every person. Your dietitian can help you calculate how many carbs you should have at each meal and for each snack. Foods that contain carbs include:  Bread, cereal, rice, pasta, and crackers.  Potatoes and corn.  Peas, beans, and lentils.  Milk and yogurt.  Fruit and juice.  Desserts, such as cakes, cookies, ice cream, and candy. How does alcohol affect  me? Alcohol can cause a sudden decrease in blood glucose (hypoglycemia), especially if you use insulin or take certain oral diabetes medicines. Hypoglycemia can be a life-threatening condition. Symptoms of hypoglycemia (sleepiness, dizziness, and confusion) are similar to symptoms of having too much alcohol. If your health care provider says that alcohol is safe for you, follow these guidelines:  Limit alcohol intake to no more than 1 drink per day for nonpregnant women and 2 drinks per day for men. One drink equals 12 oz of beer, 5 oz of wine, or 1 oz of hard liquor.  Do not drink on an empty stomach.  Keep yourself hydrated with water, diet soda, or unsweetened iced tea.  Keep in mind that regular soda, juice, and other mixers may contain a lot of sugar and must be counted as carbs. What are tips for following this plan?  Reading food labels  Start by checking the serving size on the "Nutrition Facts" label of packaged foods and drinks. The amount of calories, carbs, fats, and other nutrients listed on the label is based on one serving of the item. Many items contain more than one serving per package.  Check the total grams (g) of carbs in one serving. You can calculate the number of servings of carbs in one serving by dividing the total carbs by 15. For example, if a food has 30 g of total carbs, it would be equal to 2 servings of carbs.  Check the number of grams (g) of saturated and trans fats in one serving. Choose foods that have  low or no amount of these fats.  Check the number of milligrams (mg) of salt (sodium) in one serving. Most people should limit total sodium intake to less than 2,300 mg per day.  Always check the nutrition information of foods labeled as "low-fat" or "nonfat". These foods may be higher in added sugar or refined carbs and should be avoided.  Talk to your dietitian to identify your daily goals for nutrients listed on the label. Shopping  Avoid buying  canned, premade, or processed foods. These foods tend to be high in fat, sodium, and added sugar.  Shop around the outside edge of the grocery store. This includes fresh fruits and vegetables, bulk grains, fresh meats, and fresh dairy. Cooking  Use low-heat cooking methods, such as baking, instead of high-heat cooking methods like deep frying.  Cook using healthy oils, such as olive, canola, or sunflower oil.  Avoid cooking with butter, cream, or high-fat meats. Meal planning  Eat meals and snacks regularly, preferably at the same times every day. Avoid going long periods of time without eating.  Eat foods high in fiber, such as fresh fruits, vegetables, beans, and whole grains. Talk to your dietitian about how many servings of carbs you can eat at each meal.  Eat 4-6 ounces (oz) of lean protein each day, such as lean meat, chicken, fish, eggs, or tofu. One oz of lean protein is equal to: ? 1 oz of meat, chicken, or fish. ? 1 egg. ?  cup of tofu.  Eat some foods each day that contain healthy fats, such as avocado, nuts, seeds, and fish. Lifestyle  Check your blood glucose regularly.  Exercise regularly as told by your health care provider. This may include: ? 150 minutes of moderate-intensity or vigorous-intensity exercise each week. This could be brisk walking, biking, or water aerobics. ? Stretching and doing strength exercises, such as yoga or weightlifting, at least 2 times a week.  Take medicines as told by your health care provider.  Do not use any products that contain nicotine or tobacco, such as cigarettes and e-cigarettes. If you need help quitting, ask your health care provider.  Work with a Social worker or diabetes educator to identify strategies to manage stress and any emotional and social challenges. Questions to ask a health care provider  Do I need to meet with a diabetes educator?  Do I need to meet with a dietitian?  What number can I call if I have  questions?  When are the best times to check my blood glucose? Where to find more information:  American Diabetes Association: diabetes.org  Academy of Nutrition and Dietetics: www.eatright.[employer] of Diabetes and Digestive and Kidney Diseases (NIH): [employer] Summary  A healthy meal plan will help you control your blood glucose and maintain a healthy lifestyle.  Working with a diet and nutrition specialist (dietitian) can help you make a meal plan that is best for you.  Keep in mind that carbohydrates (carbs) and alcohol have immediate effects on your blood glucose levels. It is important to count carbs and to use alcohol carefully. This information is not intended to replace advice given to you by your health care provider. Make sure you discuss any questions you have with your health care provider. Document Released: 07/15/2005 Document Revised: 05/18/2017 Document Reviewed: 11/22/2016 Elsevier Interactive Patient Education  2019 [employer].

## 2018-11-14 NOTE — Assessment & Plan Note (Signed)
hgba1c acceptable, minimize simple carbs. Increase exercise as tolerated. Increase Metformin 500 mg tab twice daily and reassess.

## 2018-11-14 NOTE — Progress Notes (Signed)
Subjective:    Patient ID: Priscilla Butler, female    DOB: 1951/02/24, 68 y.o.   MRN: [id number]  No chief complaint on file.   HPI  Patient is in today for a 3 month follow up. She has been doing with no acute changes in health and no complaints as of today. She states that she exercises regularly (5x/wk) and has been eating fairly well.  Patient Care Team: Mosie Lukes, MD as PCP - General (Family Medicine) Ronald Lobo, MD as Consulting Physician (Gastroenterology) Marygrace Drought, MD as Consulting Physician (Ophthalmology) [facility] as Consulting Physician (Dermatology)   Past Medical History:  Diagnosis Date  . Breast cancer ([place]) 01/2008   Stage I, right breast  . 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 DeQuervain's disease (tenosynovitis)   . DM (diabetes mellitus), type 2 ([place])   . H/O measles   . H/O mumps   . History of chicken pox   . Hx of TB skin testing   . Hyperglycemia   . Hyperlipidemia   . Menopause   . Osteopenia   . Otitis externa of left ear 01/30/2017  . Preventative health care 01/19/2016  . Welcome to Medicare preventive visit 01/27/2017    Past Surgical History:  Procedure Laterality Date  . BREAST LUMPECTOMY  03/2008   Right breast  . MOHS SURGERY  2013   BCC right upper lip, [place] skin surgery  . SKIN SURGERY     facial  skin cancer  . WISDOM TOOTH EXTRACTION  1972    Family History  Problem Relation Age of Onset  . Breast cancer Maternal Grandmother   . Cancer Maternal Grandmother 21       ish  . Cancer Maternal Grandfather        lung  . Hypertension Mother   . Hyperlipidemia Mother   . Arthritis Mother   . Other Paternal Grandmother     Social History   Socioeconomic History  . Marital status: Married    Spouse name: [name]  . Number of children: 1  . Years of education: BA  . Highest education level: Not on file  Occupational History  . Occupation: Retired    Fish farm manager: UNEMPLOYED    Social Needs  . Financial resource strain: Not on file  . Food insecurity:    Worry: Not on file    Inability: Not on file  . Transportation needs:    Medical: Not on file    Non-medical: Not on file  Tobacco Use  . Smoking status: Former Smoker    Last attempt to quit: 11/02/1999    Years since quitting: 19.0  . Smokeless tobacco: Never Used  Substance and Sexual Activity  . Alcohol use: Yes    Alcohol/week: 3.0 standard drinks    Types: 3 Cans of beer per week  . Drug use: No  . Sexual activity: Yes    Partners: Male    Birth control/protection: Post-menopausal    Comment: lives with husband, retired from Press photographer, Therapist, music, No dietary restrictions  Lifestyle  . Physical activity:    Days per week: Not on file    Minutes per session: Not on file  . Stress: Not on file  Relationships  . Social connections:    Talks on phone: Not on file    Gets together: Not on file    Attends religious service: Not on file    Active member of club or organization: Not on file  Attends meetings of clubs or organizations: Not on file    Relationship status: Not on file  . Intimate partner violence:    Fear of current or ex partner: Not on file    Emotionally abused: Not on file    Physically abused: Not on file    Forced sexual activity: Not on file  Other Topics Concern  . Not on file  Social History Narrative  . Not on file    Outpatient Medications Prior to Visit  Medication Sig Dispense Refill  . aspirin 81 MG tablet Take 81 mg by mouth daily.      Marland Kitchen CALCIUM CITRATE-VITAMIN D PO Take 1 tablet by mouth 2 (two) times daily. 600-500 mg     . Cholecalciferol (VITAMIN D3) 1000 UNITS CAPS Take 1 capsule by mouth daily.     Marland Kitchen CRANBERRY PO Take 84 mg by mouth daily.    Marland Kitchen latanoprost (XALATAN) 0.005 % ophthalmic solution   99  . metFORMIN (GLUCOPHAGE) 500 MG tablet TAKE 1 TABLET BY MOUTH DAILY WITH BREAKFAST. 90 tablet 1  . Multiple Vitamin (MULTIVITAMIN) tablet Take 1  tablet by mouth daily.      . NEOMYCIN-POLYMYXIN-HYDROCORTISONE (CORTISPORIN) 1 % SOLN OTIC solution Place 5 drops into the left ear 2 (two) times daily. 10 mL 0  . Omega-3 Fatty Acids (FISH OIL) 1000 MG CAPS Take 1 capsule by mouth 2 (two) times daily.      . ONE TOUCH ULTRA TEST test strip USE TO TEST BLOOD SUGAR TWICE A DAY AS DIRECTED 100 each 1  . Probiotic Product (PROBIOTIC DAILY) CAPS Take 1 capsule by mouth daily. NOW company    . Psyllium (METAMUCIL SMOOTH TEXTURE) 28.3 % POWD 1 tsp daily in water    . rosuvastatin (CRESTOR) 40 MG tablet TAKE 1 TABLET (40 MG TOTAL) BY MOUTH DAILY. 90 tablet 1  . TURMERIC PO Take 450 mg by mouth 2 (two) times daily.     No facility-administered medications prior to visit.     No Known Allergies  Review of Systems  Constitutional: Negative for fever, malaise/fatigue and weight loss.  HENT: Negative for hearing loss.   Eyes: Negative for blurred vision, double vision and photophobia.  Respiratory: Negative for shortness of breath.   Cardiovascular: Negative for chest pain and palpitations.  Gastrointestinal: Negative for abdominal pain, blood in stool, constipation and diarrhea.  Genitourinary: Negative for frequency.  Musculoskeletal: Negative for joint pain and myalgias.  Skin: Negative for rash.  Neurological: Negative for dizziness and headaches.  Endo/Heme/Allergies: Negative for polydipsia.       Objective:     Priscilla Butler is a 68yo female who appears her stated age, WDWN, and in no acute distress. She is pleasant with no health complaints today.  Physical Exam Constitutional:      Appearance: Normal appearance. She is normal weight.  HENT:     Head: Normocephalic and atraumatic.     Right Ear: Tympanic membrane normal.     Left Ear: Tympanic membrane normal.     Nose: Nose normal.     Mouth/Throat:     Mouth: Mucous membranes are moist.     Pharynx: Oropharynx is clear.  Eyes:     Conjunctiva/sclera: Conjunctivae normal.   Cardiovascular:     Rate and Rhythm: Normal rate and regular rhythm.     Heart sounds: Normal heart sounds.  Pulmonary:     Effort: Pulmonary effort is normal.     Breath sounds: Normal breath sounds.  Abdominal:     General: Bowel sounds are normal.  Neurological:     General: No focal deficit present.     Mental Status: She is alert and oriented to person, place, and time.  Psychiatric:        Mood and Affect: Mood normal.        Behavior: Behavior normal.     There were no vitals taken for this visit. Wt Readings from Last 3 Encounters:  08/10/18 77.3 kg  04/17/18 77.1 kg  07/28/17 73.5 kg   BP Readings from Last 3 Encounters:  08/10/18 132/84  04/17/18 134/86  07/28/17 139/87     Immunization History  Administered Date(s) Administered  . Hepatitis A 12/29/2012  . Hepatitis A, Adult 07/25/2013  . Influenza Split 08/02/2012  . Influenza, High Dose Seasonal PF 07/22/2016, 07/28/2017, 07/24/2018  . Influenza,inj,Quad PF,6+ Mos 07/25/2013  . Influenza-Unspecified 08/12/2014, 08/16/2015  . Pneumococcal Conjugate-13 07/22/2016  . Pneumococcal Polysaccharide-23 12/04/2012, 02/06/2018  . Tdap 10/04/2011  . Zoster Recombinat (Shingrix) 09/27/2018    Health Maintenance  Topic Date Due  . FOOT EXAM  05/09/1961  . COLON CANCER SCREENING ANNUAL FOBT  04/08/2017  . URINE MICROALBUMIN  01/27/2018  . OPHTHALMOLOGY EXAM  07/27/2018  . HEMOGLOBIN A1C  05/09/2019  . MAMMOGRAM  11/24/2019  . TETANUS/TDAP  10/03/2021  . COLONOSCOPY  04/08/2026  . INFLUENZA VACCINE  Completed  . DEXA SCAN  Completed  . Hepatitis C Screening  Completed  . PNA vac Low Risk Adult  Completed    Lab Results  Component Value Date   WBC 5.7 08/07/2018   HGB 14.0 08/07/2018   HCT 41.1 08/07/2018   PLT 255.0 08/07/2018   GLUCOSE 132 (H) 11/08/2018   CHOL 153 11/08/2018   TRIG 100.0 11/08/2018   HDL 56.80 11/08/2018   LDLCALC 77 11/08/2018   ALT 24 11/08/2018   AST 19 11/08/2018   NA 138  11/08/2018   K 4.6 11/08/2018   CL 103 11/08/2018   CREATININE 0.79 11/08/2018   BUN 17 11/08/2018   CO2 28 11/08/2018   TSH 2.27 11/08/2018   HGBA1C 7.3 (H) 11/08/2018   MICROALBUR 2.6 01/27/2017    Lab Results  Component Value Date   TSH 2.27 11/08/2018   Lab Results  Component Value Date   WBC 5.7 08/07/2018   HGB 14.0 08/07/2018   HCT 41.1 08/07/2018   MCV 86.1 08/07/2018   PLT 255.0 08/07/2018   Lab Results  Component Value Date   NA 138 11/08/2018   K 4.6 11/08/2018   CO2 28 11/08/2018   GLUCOSE 132 (H) 11/08/2018   BUN 17 11/08/2018   CREATININE 0.79 11/08/2018   BILITOT 1.1 11/08/2018   ALKPHOS 56 11/08/2018   AST 19 11/08/2018   ALT 24 11/08/2018   PROT 6.8 11/08/2018   ALBUMIN 4.6 11/08/2018   CALCIUM 10.1 11/08/2018   GFR 77.04 11/08/2018   Lab Results  Component Value Date   CHOL 153 11/08/2018   Lab Results  Component Value Date   HDL 56.80 11/08/2018   Lab Results  Component Value Date   LDLCALC 77 11/08/2018   Lab Results  Component Value Date   TRIG 100.0 11/08/2018   Lab Results  Component Value Date   CHOLHDL 3 11/08/2018   Lab Results  Component Value Date   HGBA1C 7.3 (H) 11/08/2018         Assessment & Plan:   Problems Addressed in this Visit  Hyperlipidemia: Recent labs  showed acceptable numbers in all ranges for lipids. Encouraged continued healthy heart diet and regular exercise as tolerated.  DMII: HgB A1C has been steadily rising and is at 7.3 today. Encouraged regular exercise and healthy diet. She has not been experiencing loose stools or any other side effects of metformin, so consider increasing dose to maximum (1000mg ).   I am having Desaray R. Rosiak maintain her multivitamin, Vitamin D3, aspirin, Fish Oil, TURMERIC PO, CRANBERRY PO, CALCIUM CITRATE-VITAMIN D PO, latanoprost, NEOMYCIN-POLYMYXIN-HYDROCORTISONE, PROBIOTIC DAILY, Psyllium, ONE TOUCH ULTRA TEST, rosuvastatin, and metFORMIN.  No orders of the  defined types were placed in this encounter.   Court Joy, Student-PA   Patient seen with and examined with student.  Agree with documentation See separate note for further documentation

## 2018-11-14 NOTE — Assessment & Plan Note (Signed)
Encouraged to get adequate exercise, calcium and vitamin d intake 

## 2018-11-14 NOTE — Assessment & Plan Note (Signed)
Encouraged heart healthy diet, increase exercise, avoid trans fats, consider a krill oil cap daily 

## 2018-11-20 MED ORDER — METFORMIN HCL 500 MG PO TABS: 500.0000 mg | ORAL_TABLET | Freq: Two times a day (BID) | ORAL | 1 refills | Status: DC

## 2018-11-20 MED FILL — metFORMIN HCL 500 MG TABS: 500 | 90 days supply | Qty: 180 | Fill #0

## 2018-11-20 NOTE — Progress Notes (Signed)
Subjective:    Patient ID: Priscilla Butler, female    DOB: 12-28-1950, 68 y.o.   MRN: [id number]  No chief complaint on file.   HPI Patient is in today for follow up. She feels well today. No recent febrile illness or hospitalizations. No polyuria or polydipsia. Is exercising regularly and trying to eat well. Denies CP/palp/SOB/HA/congestion/fevers/GI or GU c/o. Taking meds as prescribed  Past Medical History:  Diagnosis Date  . Breast cancer ([place]) 01/2008   Stage I, right breast  . 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 DeQuervain's disease (tenosynovitis)   . DM (diabetes mellitus), type 2 ([place])   . H/O measles   . H/O mumps   . History of chicken pox   . Hx of TB skin testing   . Hyperglycemia   . Hyperlipidemia   . Menopause   . Osteopenia   . Otitis externa of left ear 01/30/2017  . Preventative health care 01/19/2016  . Welcome to Medicare preventive visit 01/27/2017    Past Surgical History:  Procedure Laterality Date  . BREAST LUMPECTOMY  03/2008   Right breast  . MOHS SURGERY  2013   BCC right upper lip, [place] skin surgery  . SKIN SURGERY     facial  skin cancer  . WISDOM TOOTH EXTRACTION  1972    Family History  Problem Relation Age of Onset  . Breast cancer Maternal Grandmother   . Cancer Maternal Grandmother 36       ish  . Cancer Maternal Grandfather        lung  . Hypertension Mother   . Hyperlipidemia Mother   . Arthritis Mother   . Other Paternal Grandmother     Social History   Socioeconomic History  . Marital status: Married    Spouse name: [name]  . Number of children: 1  . Years of education: BA  . Highest education level: Not on file  Occupational History  . Occupation: Retired    Fish farm manager: UNEMPLOYED  Social Needs  . Financial resource strain: Not on file  . Food insecurity:    Worry: Not on file    Inability: Not on file  . Transportation needs:    Medical: Not on file    Non-medical: Not on file  Tobacco  Use  . Smoking status: Former Smoker    Last attempt to quit: 11/02/1999    Years since quitting: 19.0  . Smokeless tobacco: Never Used  Substance and Sexual Activity  . Alcohol use: Yes    Alcohol/week: 3.0 standard drinks    Types: 3 Cans of beer per week  . Drug use: No  . Sexual activity: Yes    Partners: Male    Birth control/protection: Post-menopausal    Comment: lives with husband, retired from Press photographer, Therapist, music, No dietary restrictions  Lifestyle  . Physical activity:    Days per week: Not on file    Minutes per session: Not on file  . Stress: Not on file  Relationships  . Social connections:    Talks on phone: Not on file    Gets together: Not on file    Attends religious service: Not on file    Active member of club or organization: Not on file    Attends meetings of clubs or organizations: Not on file    Relationship status: Not on file  . Intimate partner violence:    Fear of current or ex partner: Not on file  Emotionally abused: Not on file    Physically abused: Not on file    Forced sexual activity: Not on file  Other Topics Concern  . Not on file  Social History Narrative  . Not on file    Outpatient Medications Prior to Visit  Medication Sig Dispense Refill  . aspirin 81 MG tablet Take 81 mg by mouth daily.      Marland Kitchen CALCIUM CITRATE-VITAMIN D PO Take 1 tablet by mouth 2 (two) times daily. 600-500 mg     . Cholecalciferol (VITAMIN D3) 1000 UNITS CAPS Take 1 capsule by mouth daily.     Marland Kitchen CRANBERRY PO Take 84 mg by mouth daily.    Marland Kitchen latanoprost (XALATAN) 0.005 % ophthalmic solution   99  . Multiple Vitamin (MULTIVITAMIN) tablet Take 1 tablet by mouth daily.      . NEOMYCIN-POLYMYXIN-HYDROCORTISONE (CORTISPORIN) 1 % SOLN OTIC solution Place 5 drops into the left ear 2 (two) times daily. 10 mL 0  . Omega-3 Fatty Acids (FISH OIL) 1000 MG CAPS Take 1 capsule by mouth 2 (two) times daily.      . ONE TOUCH ULTRA TEST test strip USE TO TEST BLOOD SUGAR  TWICE A DAY AS DIRECTED 100 each 1  . Probiotic Product (PROBIOTIC DAILY) CAPS Take 1 capsule by mouth daily. NOW company    . Psyllium (METAMUCIL SMOOTH TEXTURE) 28.3 % POWD 1 tsp daily in water    . rosuvastatin (CRESTOR) 40 MG tablet TAKE 1 TABLET (40 MG TOTAL) BY MOUTH DAILY. 90 tablet 1  . TURMERIC PO Take 450 mg by mouth 2 (two) times daily.    . metFORMIN (GLUCOPHAGE) 500 MG tablet TAKE 1 TABLET BY MOUTH DAILY WITH BREAKFAST. 90 tablet 1   No facility-administered medications prior to visit.     No Known Allergies  Review of Systems  Constitutional: Negative for fever and malaise/fatigue.  HENT: Negative for congestion.   Eyes: Negative for blurred vision.  Respiratory: Negative for shortness of breath.   Cardiovascular: Negative for chest pain, palpitations and leg swelling.  Gastrointestinal: Negative for abdominal pain, blood in stool and nausea.  Genitourinary: Negative for dysuria and frequency.  Musculoskeletal: Negative for falls.  Skin: Negative for rash.  Neurological: Negative for dizziness, loss of consciousness and headaches.  Endo/Heme/Allergies: Negative for environmental allergies.  Psychiatric/Behavioral: Negative for depression. The patient is not nervous/anxious.        Objective:    Physical Exam Vitals signs and nursing note reviewed.  Constitutional:      General: She is not in acute distress.    Appearance: She is well-developed.  HENT:     Head: Normocephalic and atraumatic.     Nose: Nose normal.  Eyes:     General:        Right eye: No discharge.        Left eye: No discharge.  Neck:     Musculoskeletal: Normal range of motion and neck supple.  Cardiovascular:     Rate and Rhythm: Normal rate and regular rhythm.     Heart sounds: No murmur.  Pulmonary:     Effort: Pulmonary effort is normal.     Breath sounds: Normal breath sounds.  Abdominal:     General: Bowel sounds are normal.     Palpations: Abdomen is soft.     Tenderness:  There is no abdominal tenderness.  Skin:    General: Skin is warm and dry.  Neurological:     Mental Status: She  is alert and oriented to person, place, and time.     BP 124/76 (BP Location: Left Arm, Patient Position: Sitting, Cuff Size: Normal)   Pulse 99   Temp 98.2 F (36.8 C) (Oral)   Resp 18   Wt 164 lb 12.8 oz (74.8 kg)   SpO2 97%   BMI 30.14 kg/m  Wt Readings from Last 3 Encounters:  11/14/18 164 lb 12.8 oz (74.8 kg)  08/10/18 170 lb 6.4 oz (77.3 kg)  04/17/18 170 lb (77.1 kg)     Lab Results  Component Value Date   WBC 5.7 08/07/2018   HGB 14.0 08/07/2018   HCT 41.1 08/07/2018   PLT 255.0 08/07/2018   GLUCOSE 132 (H) 11/08/2018   CHOL 153 11/08/2018   TRIG 100.0 11/08/2018   HDL 56.80 11/08/2018   LDLCALC 77 11/08/2018   ALT 24 11/08/2018   AST 19 11/08/2018   NA 138 11/08/2018   K 4.6 11/08/2018   CL 103 11/08/2018   CREATININE 0.79 11/08/2018   BUN 17 11/08/2018   CO2 28 11/08/2018   TSH 2.27 11/08/2018   HGBA1C 7.3 (H) 11/08/2018   MICROALBUR 2.6 01/27/2017    Lab Results  Component Value Date   TSH 2.27 11/08/2018   Lab Results  Component Value Date   WBC 5.7 08/07/2018   HGB 14.0 08/07/2018   HCT 41.1 08/07/2018   MCV 86.1 08/07/2018   PLT 255.0 08/07/2018   Lab Results  Component Value Date   NA 138 11/08/2018   K 4.6 11/08/2018   CO2 28 11/08/2018   GLUCOSE 132 (H) 11/08/2018   BUN 17 11/08/2018   CREATININE 0.79 11/08/2018   BILITOT 1.1 11/08/2018   ALKPHOS 56 11/08/2018   AST 19 11/08/2018   ALT 24 11/08/2018   PROT 6.8 11/08/2018   ALBUMIN 4.6 11/08/2018   CALCIUM 10.1 11/08/2018   GFR 77.04 11/08/2018   Lab Results  Component Value Date   CHOL 153 11/08/2018   Lab Results  Component Value Date   HDL 56.80 11/08/2018   Lab Results  Component Value Date   LDLCALC 77 11/08/2018   Lab Results  Component Value Date   TRIG 100.0 11/08/2018   Lab Results  Component Value Date   CHOLHDL 3 11/08/2018   Lab  Results  Component Value Date   HGBA1C 7.3 (H) 11/08/2018       Assessment & Plan:   Problem List Items Addressed This Visit    Hyperlipidemia    Encouraged heart healthy diet, increase exercise, avoid trans fats, consider a krill oil cap daily      Relevant Orders   Lipid panel   TSH   DM (diabetes mellitus), type 2 ([place]) - Primary    hgba1c acceptable, minimize simple carbs. Increase exercise as tolerated. Increase Metformin 500 mg tab twice daily and reassess.       Relevant Medications   metFORMIN (GLUCOPHAGE) 500 MG tablet   Other Relevant Orders   Hemoglobin A1c   CBC   Comprehensive metabolic panel   TSH   Osteopenia    Encouraged to get adequate exercise, calcium and vitamin d intake       Other Visit Diagnoses    Hypertension, unspecified type       Relevant Orders   TSH      I have changed Jolea R. Cruise's metFORMIN. I am also having her maintain her multivitamin, Vitamin D3, aspirin, Fish Oil, TURMERIC PO, CRANBERRY PO, CALCIUM CITRATE-VITAMIN D PO,  latanoprost, NEOMYCIN-POLYMYXIN-HYDROCORTISONE, PROBIOTIC DAILY, Psyllium, ONE TOUCH ULTRA TEST, and rosuvastatin.  Meds ordered this encounter  Medications  . metFORMIN (GLUCOPHAGE) 500 MG tablet    Sig: Take 1 tablet (500 mg total) by mouth 2 (two) times daily with a meal.    Dispense:  180 tablet    Refill:  1     Penni Homans, MD

## 2018-11-28 MED FILL — LATANOPROST 0.005% EYE DRP: 0.005 | 25 days supply | Qty: 3 | Fill #5

## 2018-12-18 DIAGNOSIS — Z1231 Encounter for screening mammogram for malignant neoplasm of breast: Secondary | ICD-10-CM | POA: Diagnosis not present

## 2018-12-18 DIAGNOSIS — Z853 Personal history of malignant neoplasm of breast: Secondary | ICD-10-CM | POA: Diagnosis not present

## 2018-12-18 LAB — HM MAMMOGRAPHY

## 2019-01-03 MED FILL — LATANOPROST 0.005% EYE DRP: 0.005 | 25 days supply | Qty: 3 | Fill #6

## 2019-01-04 ENCOUNTER — Telehealth: Payer: Self-pay | Admitting: *Deleted

## 2019-01-04 NOTE — Telephone Encounter (Signed)
Received Mammogram results from [place]; forwarded to provider/SLS 03/05

## 2019-01-05 MED FILL — SHINGRIX 50 MCG SUS: 50 | 1 days supply | Qty: 1 | Fill #1

## 2019-01-19 ENCOUNTER — Encounter: Payer: Self-pay | Admitting: Family Medicine

## 2019-01-31 ENCOUNTER — Other Ambulatory Visit: Payer: Self-pay | Admitting: Family Medicine

## 2019-01-31 MED FILL — LATANOPROST 0.005% EYE DRP: 0.005 | 25 days supply | Qty: 3 | Fill #7

## 2019-02-01 MED FILL — ROSUVASTATIN CALCIUM 40 MG: 40 | 90 days supply | Qty: 90 | Fill #0

## 2019-02-12 ENCOUNTER — Other Ambulatory Visit: Payer: PPO

## 2019-02-16 ENCOUNTER — Ambulatory Visit: Payer: PPO | Admitting: *Deleted

## 2019-02-16 ENCOUNTER — Encounter: Payer: PPO | Admitting: Family Medicine

## 2019-03-01 MED FILL — LATANOPROST 0.005% EYE DRP: 0.005 | 25 days supply | Qty: 3 | Fill #8

## 2019-03-01 MED FILL — ONE TOUCH ULTRA TEST STRIPS: 50 days supply | Qty: 100 | Fill #1

## 2019-03-19 MED FILL — metFORMIN HCL 500 MG TABS: 500 | 90 days supply | Qty: 180 | Fill #1

## 2019-03-20 DIAGNOSIS — H401113 Primary open-angle glaucoma, right eye, severe stage: Secondary | ICD-10-CM | POA: Diagnosis not present

## 2019-03-20 DIAGNOSIS — E119 Type 2 diabetes mellitus without complications: Secondary | ICD-10-CM | POA: Diagnosis not present

## 2019-03-20 DIAGNOSIS — H2513 Age-related nuclear cataract, bilateral: Secondary | ICD-10-CM | POA: Diagnosis not present

## 2019-03-20 DIAGNOSIS — H401122 Primary open-angle glaucoma, left eye, moderate stage: Secondary | ICD-10-CM | POA: Diagnosis not present

## 2019-03-21 LAB — HM DIABETES EYE EXAM

## 2019-03-21 MED FILL — TIMOLOL 0.5% EYE DROPS: 0.5 | 10 days supply | Qty: 10 | Fill #0

## 2019-03-30 MED FILL — TIMOLOL 0.5% EYE DROPS: 0.5 | 10 days supply | Qty: 10 | Fill #1

## 2019-03-30 MED FILL — LATANOPROST 0.005% EYE DRP: 0.005 | 25 days supply | Qty: 3 | Fill #9

## 2019-04-06 DIAGNOSIS — L814 Other melanin hyperpigmentation: Secondary | ICD-10-CM | POA: Diagnosis not present

## 2019-04-06 DIAGNOSIS — L218 Other seborrheic dermatitis: Secondary | ICD-10-CM | POA: Diagnosis not present

## 2019-04-06 DIAGNOSIS — D229 Melanocytic nevi, unspecified: Secondary | ICD-10-CM | POA: Diagnosis not present

## 2019-04-06 DIAGNOSIS — Z85828 Personal history of other malignant neoplasm of skin: Secondary | ICD-10-CM | POA: Diagnosis not present

## 2019-04-06 DIAGNOSIS — L821 Other seborrheic keratosis: Secondary | ICD-10-CM | POA: Diagnosis not present

## 2019-04-06 DIAGNOSIS — D1801 Hemangioma of skin and subcutaneous tissue: Secondary | ICD-10-CM | POA: Diagnosis not present

## 2019-04-15 DIAGNOSIS — R300 Dysuria: Secondary | ICD-10-CM | POA: Diagnosis not present

## 2019-04-20 MED FILL — ROSUVASTATIN CALCIUM 40 MG: 40 | 90 days supply | Qty: 90 | Fill #1

## 2019-04-30 MED FILL — LATANOPROST 0.005% EYE DRP: 0.005 | 25 days supply | Qty: 3 | Fill #10

## 2019-05-02 ENCOUNTER — Other Ambulatory Visit: Payer: Self-pay

## 2019-05-02 ENCOUNTER — Other Ambulatory Visit (INDEPENDENT_AMBULATORY_CARE_PROVIDER_SITE_OTHER): Payer: PPO

## 2019-05-02 DIAGNOSIS — E782 Mixed hyperlipidemia: Secondary | ICD-10-CM

## 2019-05-02 DIAGNOSIS — E119 Type 2 diabetes mellitus without complications: Secondary | ICD-10-CM

## 2019-05-02 DIAGNOSIS — I10 Essential (primary) hypertension: Secondary | ICD-10-CM | POA: Diagnosis not present

## 2019-05-02 LAB — COMPREHENSIVE METABOLIC PANEL
ALT: 25 U/L (ref 0–35)
AST: 21 U/L (ref 0–37)
Albumin: 4.5 g/dL (ref 3.5–5.2)
Alkaline Phosphatase: 59 U/L (ref 39–117)
BUN: 15 mg/dL (ref 6–23)
CO2: 29 mEq/L (ref 19–32)
Calcium: 9.2 mg/dL (ref 8.4–10.5)
Chloride: 103 mEq/L (ref 96–112)
Creatinine, Ser: 0.83 mg/dL (ref 0.40–1.20)
GFR: 68.37 mL/min (ref >60.00)
Glucose, Bld: 135 mg/dL — ABNORMAL HIGH (ref 70–99)
Potassium: 4.1 mEq/L (ref 3.5–5.1)
Sodium: 140 mEq/L (ref 135–145)
Total Bilirubin: 1.2 mg/dL (ref 0.2–1.2)
Total Protein: 6.6 g/dL (ref 6.0–8.3)

## 2019-05-02 LAB — LIPID PANEL
Cholesterol: 145 mg/dL (ref 0–200)
HDL: 60.1 mg/dL (ref >39.00)
LDL Cholesterol: 64 mg/dL (ref 0–99)
NonHDL: 84.87
Total CHOL/HDL Ratio: 2
Triglycerides: 103 mg/dL (ref 0.0–149.0)
VLDL: 20.6 mg/dL (ref 0.0–40.0)

## 2019-05-02 LAB — CBC
HCT: 40.5 % (ref 36.0–46.0)
Hemoglobin: 13.3 g/dL (ref 12.0–15.0)
MCHC: 32.9 g/dL (ref 30.0–36.0)
MCV: 87.9 fl (ref 78.0–100.0)
Platelets: 248 10*3/uL (ref 150.0–400.0)
RBC: 4.61 Mil/uL (ref 3.87–5.11)
RDW: 13.3 % (ref 11.5–15.5)
WBC: 5.4 10*3/uL (ref 4.0–10.5)

## 2019-05-02 LAB — TSH: TSH: 2.23 u[IU]/mL (ref 0.35–4.50)

## 2019-05-02 LAB — HEMOGLOBIN A1C: Hgb A1c MFr Bld: 7 % — ABNORMAL HIGH (ref 4.6–6.5)

## 2019-05-07 ENCOUNTER — Other Ambulatory Visit: Payer: Self-pay

## 2019-05-07 ENCOUNTER — Encounter: Payer: Self-pay | Admitting: Family Medicine

## 2019-05-07 ENCOUNTER — Ambulatory Visit (INDEPENDENT_AMBULATORY_CARE_PROVIDER_SITE_OTHER): Payer: PPO | Admitting: Family Medicine

## 2019-05-07 ENCOUNTER — Ambulatory Visit: Payer: PPO | Admitting: *Deleted

## 2019-05-07 DIAGNOSIS — Z0000 Encounter for general adult medical examination without abnormal findings: Secondary | ICD-10-CM

## 2019-05-07 DIAGNOSIS — E782 Mixed hyperlipidemia: Secondary | ICD-10-CM

## 2019-05-07 DIAGNOSIS — E119 Type 2 diabetes mellitus without complications: Secondary | ICD-10-CM

## 2019-05-07 NOTE — Assessment & Plan Note (Addendum)
Encouraged heart healthy diet, increase exercise, avoid trans fats, consider a krill oil cap daily. Notes she has not been eating out as much during the pandemic.

## 2019-05-07 NOTE — Assessment & Plan Note (Signed)
Patient encouraged to maintain heart healthy diet, regular exercise, adequate sleep. Consider daily probiotics. Take medications as prescribed. Labs reviewed, will proceed with pap at next annual, no history of GYN concerns. Colonoscopy UTD and MGM UTD

## 2019-05-07 NOTE — Progress Notes (Signed)
Virtual Visit via Video Note  I connected with Priscilla Butler on 05/07/19 at  3:00 PM EDT by a video enabled telemedicine application and verified that I am speaking with the correct person using two identifiers.  Location: Patient: home  Provider: office   I discussed the limitations of evaluation and management by telemedicine and the availability of in person appointments. The patient expressed understanding and agreed to proceed. Magdalene Molly, CMA was able to get patient set up on virtual video visit.     Subjective:    Patient ID: Priscilla Butler, female    DOB: 08/10/1951, 68 y.o.   MRN: [id number]  No chief complaint on file.   HPI Patient is in today for annual preventative exam. She feels well today. No recent febrile illness or hospitalizations. 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 No fevers, chills, or recent acute illness. Denies CP/palp/SOB/HA/congestion/fevers/GI or GU c/o. 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 ([place]) 01/19/2016   DeQuervain's disease (tenosynovitis)    DM (diabetes mellitus), type 2 ([place])    H/O measles    H/O mumps    History of chicken pox    Hx of TB skin testing    Hyperglycemia    Hyperlipidemia    Menopause    Osteopenia    Otitis externa of left ear 01/30/2017   Preventative health care 01/19/2016   Welcome to Medicare preventive visit 01/27/2017    Past Surgical History:  Procedure Laterality Date   BREAST LUMPECTOMY  03/2008   Right breast   MOHS SURGERY  2013   BCC right upper lip, [place] skin surgery   SKIN SURGERY     facial  skin cancer   WISDOM TOOTH EXTRACTION  1972    Family History  Problem Relation Age of Onset   Breast cancer Maternal Grandmother    Cancer Maternal Grandmother 23       ish    Cancer Maternal Grandfather        lung   Hypertension Mother    Hyperlipidemia Mother    Arthritis Mother    Other Paternal Grandmother     Social History   Socioeconomic History   Marital status: Married    Spouse name: [name]   Number of children: 1   Years of education: BA   Highest education level: Not on file  Occupational History   Occupation: Retired    Fish farm manager: UNEMPLOYED  Social Designer, fashion/clothing strain: Not on file   Food insecurity    Worry: Not on file    Inability: Not on file   Transportation needs    Medical: Not on file    Non-medical: Not on file  Tobacco Use   Smoking status: Former Smoker    Quit date: 11/02/1999    Years since quitting: 19.5   Smokeless tobacco: Never Used  Substance and Sexual Activity   Alcohol use: Yes    Alcohol/week: 3.0 standard drinks    Types: 3 Cans of beer per week   Drug use: No   Sexual activity: Yes    Partners: Male    Birth control/protection: Post-menopausal    Comment: lives with husband, retired from Press photographer, Therapist, music, No dietary restrictions  Lifestyle   Physical activity  Days per week: Not on file    Minutes per session: Not on file   Stress: Not on file  Relationships   Social connections    Talks on phone: Not on file    Gets together: Not on file    Attends religious service: Not on file    Active member of club or organization: Not on file    Attends meetings of clubs or organizations: Not on file    Relationship status: Not on file   Intimate partner violence    Fear of current or ex partner: Not on file    Emotionally abused: Not on file    Physically abused: Not on file    Forced sexual activity: Not on file  Other Topics Concern   Not on file  Social History Narrative   Not on file    Outpatient Medications Prior to Visit  Medication Sig Dispense Refill   aspirin 81 MG tablet Take 81 mg by mouth daily.       CALCIUM CITRATE-VITAMIN D PO Take  1 tablet by mouth 2 (two) times daily. 600-500 mg      Cholecalciferol (VITAMIN D3) 1000 UNITS CAPS Take 1 capsule by mouth daily.      CRANBERRY PO Take 84 mg by mouth daily.     latanoprost (XALATAN) 0.005 % ophthalmic solution   99   metFORMIN (GLUCOPHAGE) 500 MG tablet Take 1 tablet (500 mg total) by mouth 2 (two) times daily with a meal. 180 tablet 1   Multiple Vitamin (MULTIVITAMIN) tablet Take 1 tablet by mouth daily.       NEOMYCIN-POLYMYXIN-HYDROCORTISONE (CORTISPORIN) 1 % SOLN OTIC solution Place 5 drops into the left ear 2 (two) times daily. 10 mL 0   Omega-3 Fatty Acids (FISH OIL) 1000 MG CAPS Take 1 capsule by mouth 2 (two) times daily.       ONE TOUCH ULTRA TEST test strip USE TO TEST BLOOD SUGAR TWICE A DAY AS DIRECTED 100 each 1   Probiotic Product (PROBIOTIC DAILY) CAPS Take 1 capsule by mouth daily. NOW company     Psyllium (METAMUCIL SMOOTH TEXTURE) 28.3 % POWD 1 tsp daily in water     rosuvastatin (CRESTOR) 40 MG tablet TAKE 1 TABLET BY MOUTH DAILY. 90 tablet 1   TURMERIC PO Take 450 mg by mouth 2 (two) times daily.     No facility-administered medications prior to visit.     No Known Allergies  Review of Systems  Constitutional: Negative for chills, fever and malaise/fatigue.  HENT: Negative for congestion and hearing loss.   Eyes: Negative for discharge.  Respiratory: Negative for cough, sputum production and shortness of breath.   Cardiovascular: Negative for chest pain, palpitations and leg swelling.  Gastrointestinal: Negative for abdominal pain, blood in stool, constipation, diarrhea, heartburn, nausea and vomiting.  Genitourinary: Negative for dysuria, frequency, hematuria and urgency.  Musculoskeletal: Negative for back pain, falls and myalgias.  Skin: Negative for rash.  Neurological: Negative for dizziness, sensory change, loss of consciousness, weakness and headaches.  Endo/Heme/Allergies: Negative for environmental allergies. Does not  bruise/bleed easily.  Psychiatric/Behavioral: Negative for depression and suicidal ideas. The patient is not nervous/anxious and does not have insomnia.        Objective:    Physical Exam Constitutional:      Appearance: Normal appearance. She is not ill-appearing.  HENT:     Head: Normocephalic and atraumatic.     Nose: Nose normal.  Eyes:     General:  Right eye: No discharge.        Left eye: No discharge.  Pulmonary:     Effort: Pulmonary effort is normal.  Neurological:     General: No focal deficit present.     Mental Status: She is alert and oriented to person, place, and time.     Wt 165 lb (74.8 kg)    BMI 30.18 kg/m  Wt Readings from Last 3 Encounters:  05/07/19 165 lb (74.8 kg)  11/14/18 164 lb 12.8 oz (74.8 kg)  08/10/18 170 lb 6.4 oz (77.3 kg)    Diabetic Foot Exam - Simple   No data filed     Lab Results  Component Value Date   WBC 5.4 05/02/2019   HGB 13.3 05/02/2019   HCT 40.5 05/02/2019   PLT 248.0 05/02/2019   GLUCOSE 135 (H) 05/02/2019   CHOL 145 05/02/2019   TRIG 103.0 05/02/2019   HDL 60.10 05/02/2019   LDLCALC 64 05/02/2019   ALT 25 05/02/2019   AST 21 05/02/2019   NA 140 05/02/2019   K 4.1 05/02/2019   CL 103 05/02/2019   CREATININE 0.83 05/02/2019   BUN 15 05/02/2019   CO2 29 05/02/2019   TSH 2.23 05/02/2019   HGBA1C 7.0 (H) 05/02/2019   MICROALBUR 2.6 01/27/2017    Lab Results  Component Value Date   TSH 2.23 05/02/2019   Lab Results  Component Value Date   WBC 5.4 05/02/2019   HGB 13.3 05/02/2019   HCT 40.5 05/02/2019   MCV 87.9 05/02/2019   PLT 248.0 05/02/2019   Lab Results  Component Value Date   NA 140 05/02/2019   K 4.1 05/02/2019   CO2 29 05/02/2019   GLUCOSE 135 (H) 05/02/2019   BUN 15 05/02/2019   CREATININE 0.83 05/02/2019   BILITOT 1.2 05/02/2019   ALKPHOS 59 05/02/2019   AST 21 05/02/2019   ALT 25 05/02/2019   PROT 6.6 05/02/2019   ALBUMIN 4.5 05/02/2019   CALCIUM 9.2 05/02/2019   GFR  68.37 05/02/2019   Lab Results  Component Value Date   CHOL 145 05/02/2019   Lab Results  Component Value Date   HDL 60.10 05/02/2019   Lab Results  Component Value Date   LDLCALC 64 05/02/2019   Lab Results  Component Value Date   TRIG 103.0 05/02/2019   Lab Results  Component Value Date   CHOLHDL 2 05/02/2019   Lab Results  Component Value Date   HGBA1C 7.0 (H) 05/02/2019       Assessment & Plan:   Problem List Items Addressed This Visit    Hyperlipidemia    Encouraged heart healthy diet, increase exercise, avoid trans fats, consider a krill oil cap daily. Notes she has not been eating out as much during the pandemic.       DM (diabetes mellitus), type 2 ([facility])    hgba1c acceptable, minimize simple carbs. Increase exercise as tolerated. Continue current meds      Preventative health care    Patient encouraged to maintain heart healthy diet, regular exercise, adequate sleep. Consider daily probiotics. Take medications as prescribed. Labs reviewed, will proceed with pap at next annual, no history of GYN concerns. Colonoscopy UTD and MGM UTD         I am having Malyn R. Knoch maintain her multivitamin, Vitamin D3, aspirin, Fish Oil, TURMERIC PO, CRANBERRY PO, CALCIUM CITRATE-VITAMIN D PO, latanoprost, NEOMYCIN-POLYMYXIN-HYDROCORTISONE, Probiotic Daily, Psyllium, ONE TOUCH ULTRA TEST, metFORMIN, and rosuvastatin.  No orders of  the defined types were placed in this encounter.  I discussed the assessment and treatment plan with the patient. The patient was provided an opportunity to ask questions and all were answered. The patient agreed with the plan and demonstrated an understanding of the instructions.   The patient was advised to call back or seek an in-person evaluation if the symptoms worsen or if the condition fails to improve as anticipated.  I provided 25 minutes of non-face-to-face time during this encounter.   Penni Homans, MD

## 2019-05-07 NOTE — Assessment & Plan Note (Signed)
hgba1c acceptable, minimize simple carbs. Increase exercise as tolerated. Continue current meds 

## 2019-05-09 DIAGNOSIS — H401122 Primary open-angle glaucoma, left eye, moderate stage: Secondary | ICD-10-CM | POA: Diagnosis not present

## 2019-05-09 DIAGNOSIS — H401113 Primary open-angle glaucoma, right eye, severe stage: Secondary | ICD-10-CM | POA: Diagnosis not present

## 2019-05-29 ENCOUNTER — Other Ambulatory Visit: Payer: Self-pay | Admitting: Family Medicine

## 2019-05-29 MED FILL — TIMOLOL 0.5% EYE DROPS: 0.5 | 10 days supply | Qty: 10 | Fill #2

## 2019-05-29 MED FILL — LATANOPROST 0.005% EYE DRP: 0.005 | 25 days supply | Qty: 3 | Fill #11

## 2019-05-30 MED FILL — metFORMIN HCL 500 MG TABS: 500 | 90 days supply | Qty: 180 | Fill #0

## 2019-06-01 ENCOUNTER — Other Ambulatory Visit: Payer: Self-pay

## 2019-06-26 MED FILL — LATANOPROST 0.005% EYE DRP: 0.005 | 25 days supply | Qty: 3 | Fill #12

## 2019-06-26 MED FILL — FLUAD QUADRIVALENT 0.5 ML P: 0.5 | 1 days supply | Qty: 1 | Fill #0

## 2019-06-29 ENCOUNTER — Encounter: Payer: Self-pay | Admitting: Family Medicine

## 2019-07-05 MED ORDER — OMEGA-3-ACID ETHYL ESTERS 1 G PO CAPS: 1.0000 g | ORAL_CAPSULE | Freq: Two times a day (BID) | ORAL | 3 refills | Status: DC

## 2019-07-05 MED FILL — OMEGA-3-ACID ETHYL ESTERS 1: 1 | 30 days supply | Qty: 60 | Fill #0

## 2019-07-05 NOTE — Addendum Note (Signed)
Addended by: Magdalene Molly A on: 07/05/2019 09:48 AM   Modules accepted: Orders

## 2019-07-05 NOTE — Telephone Encounter (Signed)
Medication sent.

## 2019-07-24 ENCOUNTER — Other Ambulatory Visit: Payer: Self-pay | Admitting: Family Medicine

## 2019-07-24 MED FILL — TIMOLOL 0.5% EYE DROPS: 0.5 | 50 days supply | Qty: 10 | Fill #3

## 2019-07-24 MED FILL — LATANOPROST 0.005% EYE DRP: 0.005 | 75 days supply | Qty: 8 | Fill #0

## 2019-07-25 MED FILL — ONE TOUCH ULTRA TEST STRIPS: 50 days supply | Qty: 100 | Fill #0

## 2019-07-30 MED FILL — OMEGA-3-ACID ETHYL ESTERS 1: 1 | 30 days supply | Qty: 60 | Fill #1

## 2019-08-15 ENCOUNTER — Other Ambulatory Visit: Payer: Self-pay

## 2019-08-15 ENCOUNTER — Ambulatory Visit (INDEPENDENT_AMBULATORY_CARE_PROVIDER_SITE_OTHER): Payer: PPO | Admitting: Family Medicine

## 2019-08-15 ENCOUNTER — Encounter: Payer: Self-pay | Admitting: Family Medicine

## 2019-08-15 VITALS — BP 136/80 | HR 71 | Temp 97.4°F | Resp 18 | Ht 62.0 in | Wt 165.0 lb

## 2019-08-15 DIAGNOSIS — R21 Rash and other nonspecific skin eruption: Secondary | ICD-10-CM | POA: Insufficient documentation

## 2019-08-15 DIAGNOSIS — R350 Frequency of micturition: Secondary | ICD-10-CM | POA: Insufficient documentation

## 2019-08-15 DIAGNOSIS — E119 Type 2 diabetes mellitus without complications: Secondary | ICD-10-CM

## 2019-08-15 DIAGNOSIS — R300 Dysuria: Secondary | ICD-10-CM | POA: Diagnosis not present

## 2019-08-15 LAB — POCT URINALYSIS DIP (MANUAL ENTRY)
Bilirubin, UA: NEGATIVE
Glucose, UA: NEGATIVE mg/dL
Ketones, POC UA: NEGATIVE mg/dL
Nitrite, UA: NEGATIVE
Protein Ur, POC: NEGATIVE mg/dL
Spec Grav, UA: 1.015 (ref 1.010–1.025)
Urobilinogen, UA: 0.2 E.U./dL
pH, UA: 6 (ref 5.0–8.0)

## 2019-08-15 MED ORDER — CEPHALEXIN 500 MG PO CAPS: 500.0000 mg | ORAL_CAPSULE | Freq: Four times a day (QID) | ORAL | 0 refills | Status: DC

## 2019-08-15 MED ORDER — PREDNISONE 10 MG PO TABS: ORAL_TABLET | ORAL | 0 refills | Status: DC

## 2019-08-15 MED ORDER — METHYLPREDNISOLONE ACETATE 80 MG/ML IJ SUSP
80.0000 mg | Freq: Once | INTRAMUSCULAR | Status: AC
  Administered 2019-08-15: 12:00:00 80 mg via INTRAMUSCULAR

## 2019-08-15 MED FILL — CEPHALEXIN 500 MG CAPSULE: 500 | 4 days supply | Qty: 14 | Fill #0

## 2019-08-15 MED FILL — predniSONE 10 MG TABS: 10 | 12 days supply | Qty: 20 | Fill #0

## 2019-08-15 NOTE — Patient Instructions (Addendum)
Sliding scale insulin Check your blood sugar 4x a day0----  Fasting and 2 h after each meal  200-250---2 u  251-300--4 u 301-350  6 u  351-400  8 u >400   10 u and call dr     Rash, Adult A rash is a change in the color of your skin. A rash can also change the way your skin feels. There are many different conditions and factors that can cause a rash. Some rashes may disappear after a few days, but some may last for a few weeks. Common causes of rashes include:  Viral infections, such as: ? Colds. ? Measles. ? Hand, foot, and mouth disease.  Bacterial infections, such as: ? Scarlet fever. ? Impetigo.  Fungal infections, such as Candida.  Allergic reactions to food, medicines, or skin care products. Follow these instructions at home: The goal of treatment is to stop the itching and keep the rash from spreading. Pay attention to any changes in your symptoms. Follow these instructions to help with your condition: Medicine Take or apply over-the-counter and prescription medicines only as told by your health care provider. These may include:  Corticosteroid creams to treat red or swollen skin.  Anti-itch lotions.  Oral allergy medicines (antihistamines).  Oral corticosteroids for severe symptoms.  Skin care  Apply cool compresses to the affected areas.  Do not scratch or rub your skin.  Avoid covering the rash. Make sure the rash is exposed to air as much as possible. Managing itching and discomfort  Avoid hot showers or baths, which can make itching worse. A cold shower may help.  Try taking a bath with: ? Epsom salts. Follow manufacturer instructions on the packaging. You can get these at your local pharmacy or grocery store. ? Baking soda. Pour a small amount into the bath as told by your health care provider. ? Colloidal oatmeal. Follow manufacturer instructions on the packaging. You can get this at your local pharmacy or grocery store.  Try applying baking soda  paste to your skin. Stir water into baking soda until it reaches a paste-like consistency.  Try applying calamine lotion. This is an over-the-counter lotion that helps to relieve itchiness.  Keep cool and out of the sun. Sweating and being hot can make itching worse. General instructions   Rest as needed.  Drink enough fluid to keep your urine pale yellow.  Wear loose-fitting clothing.  Avoid scented soaps, detergents, and perfumes. Use gentle soaps, detergents, perfumes, and other cosmetic products.  Avoid any substance that causes your rash. Keep a journal to help track what causes your rash. Write down: ? What you eat. ? What cosmetic products you use. ? What you drink. ? What you wear. This includes jewelry.  Keep all follow-up visits as told by your health care provider. This is important. Contact a health care provider if:  You sweat at night.  You lose weight.  You urinate more than normal.  You urinate less than normal, or you notice that your urine is a darker color than usual.  You feel weak.  You vomit.  Your skin or the whites of your eyes look yellow (jaundice).  Your skin: ? Tingles. ? Is numb.  Your rash: ? Does not go away after several days. ? Gets worse.  You are: ? Unusually thirsty. ? More tired than normal.  You have: ? New symptoms. ? Pain in your abdomen. ? A fever. ? Diarrhea. Get help right away if you:  Have a  fever and your symptoms suddenly get worse.  Develop confusion.  Have a severe headache or a stiff neck.  Have severe joint pains or stiffness.  Have a seizure.  Develop a rash that covers all or most of your body. The rash may or may not be painful.  Develop blisters that: ? Are on top of the rash. ? Grow larger or grow together. ? Are painful. ? Are inside your nose or mouth.  Develop a rash that: ? Looks like purple pinprick-sized spots all over your body. ? Has a "bull's eye" or looks like a  target. ? Is not related to sun exposure, is red and painful, and causes your skin to peel. Summary  A rash is a change in the color of your skin. Some rashes disappear after a few days, but some may last for a few weeks.  The goal of treatment is to stop the itching and keep the rash from spreading.  Take or apply over-the-counter and prescription medicines only as told by your health care provider.  Contact a health care provider if you have new or worsening symptoms.  Keep all follow-up visits as told by your health care provider. This is important. This information is not intended to replace advice given to you by your health care provider. Make sure you discuss any questions you have with your health care provider. Document Released: 10/08/2002 Document Revised: 02/09/2019 Document Reviewed: 05/22/2018 Elsevier Patient Education  2020 [employer].

## 2019-08-15 NOTE — Progress Notes (Signed)
Patient ID: Priscilla Butler, female    DOB: 08-22-1951  Age: 68 y.o. MRN: [id number]    Subjective:  Subjective  HPI Priscilla Butler presents with a c/o rash on arms and legs x 1 week ----pt denies insect/ bug bites ,  No new lotions detergents , no new foods   The rash is very itchy.    No fever/ chills  She also c/o urinary frequency and dysuria No flank pain, no abd pain   Review of Systems  Constitutional: Negative for chills and fever.  HENT: Negative for congestion and hearing loss.   Eyes: Negative for discharge.  Respiratory: Negative for cough and shortness of breath.   Cardiovascular: Negative for chest pain, palpitations and leg swelling.  Gastrointestinal: Negative for abdominal pain, blood in stool, constipation, diarrhea, nausea and vomiting.  Genitourinary: Positive for dysuria and frequency. Negative for hematuria and urgency.  Musculoskeletal: Negative for back pain and myalgias.  Skin: Negative for rash.  Allergic/Immunologic: Negative for environmental allergies.  Neurological: Negative for dizziness, weakness and headaches.  Hematological: Does not bruise/bleed easily.  Psychiatric/Behavioral: Negative for suicidal ideas. The patient is not nervous/anxious.     History Past Medical History:  Diagnosis Date  . Breast cancer ([place]) 01/2008   Stage I, right breast  . 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 DeQuervain's disease (tenosynovitis)   . DM (diabetes mellitus), type 2 ([place])   . H/O measles   . H/O mumps   . History of chicken pox   . Hx of TB skin testing   . Hyperglycemia   . Hyperlipidemia   . Menopause   . Osteopenia   . Otitis externa of left ear 01/30/2017  . Preventative health care 01/19/2016  . Welcome to Medicare preventive visit 01/27/2017    She has a past surgical history that includes Wisdom tooth extraction (1972); Breast lumpectomy (03/2008); Skin surgery; and Mohs surgery (2013).   Her family history includes Arthritis  in her mother; Breast cancer in her maternal grandmother; Cancer in her maternal grandfather; Cancer (age of onset: 62) in her maternal grandmother; Hyperlipidemia in her mother; Hypertension in her mother; Other in her paternal grandmother.She reports that she quit smoking about 19 years ago. She has never used smokeless tobacco. She reports current alcohol use of about 3.0 standard drinks of alcohol per week. She reports that she does not use drugs.  Current Outpatient Medications on File Prior to Visit  Medication Sig Dispense Refill  . aspirin 81 MG tablet Take 81 mg by mouth daily.      Marland Kitchen CALCIUM CITRATE-VITAMIN D PO Take 1 tablet by mouth 2 (two) times daily. 600-500 mg     . Cholecalciferol (VITAMIN D3) 1000 UNITS CAPS Take 1 capsule by mouth daily.     Marland Kitchen CRANBERRY PO Take 84 mg by mouth daily.    Marland Kitchen latanoprost (XALATAN) 0.005 % ophthalmic solution   99  . metFORMIN (GLUCOPHAGE) 500 MG tablet TAKE 1 TABLET (500 MG TOTAL) BY MOUTH 2 (TWO) TIMES DAILY WITH A MEAL. 180 tablet 1  . Multiple Vitamin (MULTIVITAMIN) tablet Take 1 tablet by mouth daily.      . Omega-3 Fatty Acids (FISH OIL) 1000 MG CAPS Take 1 capsule by mouth 2 (two) times daily.      Glory Rosebush ULTRA test strip USE TO TEST BLOOD SUGAR TWICE A DAY AS DIRECTED 100 strip 1  . Probiotic Product (PROBIOTIC DAILY) CAPS Take 1 capsule by mouth daily. NOW  company    . Psyllium (METAMUCIL SMOOTH TEXTURE) 28.3 % POWD 1 tsp daily in water    . rosuvastatin (CRESTOR) 40 MG tablet TAKE 1 TABLET BY MOUTH DAILY. 90 tablet 1  . timolol (TIMOPTIC) 0.5 % ophthalmic solution     . TURMERIC PO Take 450 mg by mouth 2 (two) times daily.    . NEOMYCIN-POLYMYXIN-HYDROCORTISONE (CORTISPORIN) 1 % SOLN OTIC solution Place 5 drops into the left ear 2 (two) times daily. (Patient not taking: Reported on 08/15/2019) 10 mL 0  . omega-3 acid ethyl esters (LOVAZA) 1 g capsule Take 1 capsule (1 g total) by mouth 2 (two) times daily. (Patient not taking: Reported  on 08/15/2019) 60 capsule 3   No current facility-administered medications on file prior to visit.      Objective:  Objective  Physical Exam Vitals signs and nursing note reviewed.  Constitutional:      Appearance: She is well-developed.  HENT:     Head: Normocephalic and atraumatic.  Eyes:     Conjunctiva/sclera: Conjunctivae normal.  Neck:     Musculoskeletal: Normal range of motion and neck supple.     Thyroid: No thyromegaly.     Vascular: No carotid bruit or JVD.  Skin:    Findings: Rash present. Rash is papular.  Neurological:     Mental Status: She is alert and oriented to person, place, and time.    BP 136/80 (BP Location: Right Arm, Patient Position: Sitting, Cuff Size: Normal)   Pulse 71   Temp (!) 97.4 F (36.3 C) (Temporal)   Resp 18   Ht 5\' 2"  (1.575 m)   Wt 165 lb (74.8 kg)   SpO2 96%   BMI 30.18 kg/m  Wt Readings from Last 3 Encounters:  08/15/19 165 lb (74.8 kg)  05/07/19 165 lb (74.8 kg)  11/14/18 164 lb 12.8 oz (74.8 kg)     Lab Results  Component Value Date   WBC 5.4 05/02/2019   HGB 13.3 05/02/2019   HCT 40.5 05/02/2019   PLT 248.0 05/02/2019   GLUCOSE 135 (H) 05/02/2019   CHOL 145 05/02/2019   TRIG 103.0 05/02/2019   HDL 60.10 05/02/2019   LDLCALC 64 05/02/2019   ALT 25 05/02/2019   AST 21 05/02/2019   NA 140 05/02/2019   K 4.1 05/02/2019   CL 103 05/02/2019   CREATININE 0.83 05/02/2019   BUN 15 05/02/2019   CO2 29 05/02/2019   TSH 2.23 05/02/2019   HGBA1C 7.0 (H) 05/02/2019   MICROALBUR 2.6 01/27/2017    Dg Cervical Spine Complete  Result Date: 08/08/2015 CLINICAL DATA:  Right-sided neck and shoulder pain, no injury EXAM: CERVICAL SPINE  4+ VIEWS COMPARISON:  None. FINDINGS: The cervical vertebrae are straightened in alignment. There is degenerative disc disease at C5-6 and C6-7 where there is loss of disc space and sclerosis with spurring. No prevertebral soft tissue swelling is seen. There may be mild degenerative disc  disease at C4-5 as well. On oblique views there is some foraminal narrowing at C4-5 and C5-6 levels. The odontoid process is intact. The lung apices are clear. IMPRESSION: Straightened alignment with degenerative disc disease at C5-6 and C6-7 with minimal degenerative change at C4-5. Electronically Signed   By: Ivar Drape M.D.   On: 08/08/2015 16:18   Ua==  +leuk  + blood    Assessment & Plan:  Plan  I am having Priscilla Butler start on cephALEXin and predniSONE. I am also having her maintain her  multivitamin, Vitamin D3, aspirin, Fish Oil, TURMERIC PO, CRANBERRY PO, CALCIUM CITRATE-VITAMIN D PO, latanoprost, NEOMYCIN-POLYMYXIN-HYDROCORTISONE, Probiotic Daily, Psyllium, rosuvastatin, metFORMIN, omega-3 acid ethyl esters, OneTouch Ultra, and timolol. We administered methylPREDNISolone acetate.  Meds ordered this encounter  Medications  . cephALEXin (KEFLEX) 500 MG capsule    Sig: Take 1 capsule (500 mg total) by mouth 4 (four) times daily.    Dispense:  14 capsule    Refill:  0  . predniSONE (DELTASONE) 10 MG tablet    Sig: TAKE 3 TABLETS PO QD FOR 3 DAYS THEN TAKE 2 TABLETS PO QD FOR 3 DAYS THEN TAKE 1 TABLET PO QD FOR 3 DAYS THEN TAKE 1/2 TAB PO QD FOR 3 DAYS    Dispense:  20 tablet    Refill:  0  . methylPREDNISolone acetate (DEPO-MEDROL) injection 80 mg    Problem List Items Addressed This Visit      Unprioritized   DM (diabetes mellitus), type 2 ([place])    Pt given humalog kwik pen in case her BS run high She was given a sliding scale to use and instructed to check her bs qid  Sliding scale on avs Pt instructed how to use pen and will call with any questions       Dysuria    Keflex x 7 days Culture pending       Relevant Medications   cephALEXin (KEFLEX) 500 MG capsule   Rash - Primary    ? Etiology Look like bug bites but pt insists it is not pred taper  Depo medrol given       Relevant Medications   predniSONE (DELTASONE) 10 MG tablet   Urinary frequency   Relevant  Medications   cephALEXin (KEFLEX) 500 MG capsule   Other Relevant Orders   POCT urinalysis dipstick (Completed)   Urine Culture      Follow-up: Return if symptoms worsen or fail to improve.  Ann Held, DO

## 2019-08-15 NOTE — Assessment & Plan Note (Signed)
Keflex x 7 days  ?Culture pending  ?

## 2019-08-15 NOTE — Assessment & Plan Note (Signed)
Pt given humalog kwik pen in case her BS run high She was given a sliding scale to use and instructed to check her bs qid  Sliding scale on avs Pt instructed how to use pen and will call with any questions

## 2019-08-15 NOTE — Assessment & Plan Note (Signed)
?   Etiology Look like bug bites but pt insists it is not pred taper  Depo medrol given

## 2019-08-16 LAB — URINE CULTURE
MICRO NUMBER:: 988837
SPECIMEN QUALITY:: ADEQUATE

## 2019-08-30 ENCOUNTER — Other Ambulatory Visit: Payer: Self-pay | Admitting: Family Medicine

## 2019-08-30 MED FILL — metFORMIN HCL 500 MG TABS: 500 | 90 days supply | Qty: 180 | Fill #1

## 2019-08-30 MED FILL — ROSUVASTATIN CALCIUM 40 MG: 40 | 90 days supply | Qty: 90 | Fill #0

## 2019-08-30 MED FILL — OMEGA-3-ACID ETHYL ESTERS 1: 1 | 30 days supply | Qty: 60 | Fill #2

## 2019-09-04 ENCOUNTER — Other Ambulatory Visit: Payer: Self-pay

## 2019-09-04 MED ORDER — CALCIUM CITRATE-VITAMIN D 250-100 MG-UNIT PO TABS: 1.0000 | ORAL_TABLET | Freq: Two times a day (BID) | ORAL | 1 refills | Status: DC

## 2019-09-04 MED ORDER — TURMERIC 400 MG PO CAPS: 450.0000 mg | ORAL_CAPSULE | Freq: Two times a day (BID) | ORAL | 1 refills | Status: DC

## 2019-09-04 MED ORDER — ASPIRIN EC 81 MG PO TBEC: 81.0000 mg | DELAYED_RELEASE_TABLET | Freq: Every day | ORAL | 1 refills | Status: DC

## 2019-09-04 MED ORDER — VITAMIN D3 25 MCG (1000 UT) PO CAPS: 1.0000 | ORAL_CAPSULE | Freq: Every day | ORAL | 1 refills | Status: DC

## 2019-09-04 MED ORDER — ACETAMINOPHEN 500 MG PO TABS: 500.0000 mg | ORAL_TABLET | Freq: Three times a day (TID) | ORAL | 1 refills | Status: DC | PRN

## 2019-09-04 MED ORDER — GLUCOSAMINE-CHONDROITIN 500-400 MG PO TABS: 1.0000 | ORAL_TABLET | Freq: Three times a day (TID) | ORAL | 1 refills | Status: DC

## 2019-09-04 MED FILL — ACETAMINOPHEN EXTRA STRENGT: 500 | 33 days supply | Qty: 100 | Fill #0

## 2019-09-04 MED FILL — ASPIRIN 81 MG TBEC: 81 | 120 days supply | Qty: 120 | Fill #0

## 2019-09-04 MED FILL — SM CALCIUM CITRATE + D3 630: 630MG/500IU | 60 days supply | Qty: 120 | Fill #0

## 2019-09-04 NOTE — Telephone Encounter (Signed)
Patient sent in request for medications

## 2019-09-10 MED FILL — TIMOLOL MALEATE 0.5 % SOLN: 0.5 | 50 days supply | Qty: 10 | Fill #4

## 2019-10-08 MED FILL — TURMERIC COMPLEX 500 MG CAP: 500-3 | 60 days supply | Qty: 120 | Fill #0

## 2019-10-08 MED FILL — OMEGA-3-ACID ETHYL ESTERS 1: 1 | 30 days supply | Qty: 60 | Fill #3

## 2019-10-08 MED FILL — VITAMIN D3 25 MCG (1000 UT): 25 MCG | 100 days supply | Qty: 100 | Fill #0

## 2019-10-10 DIAGNOSIS — H401113 Primary open-angle glaucoma, right eye, severe stage: Secondary | ICD-10-CM | POA: Diagnosis not present

## 2019-10-10 DIAGNOSIS — H401122 Primary open-angle glaucoma, left eye, moderate stage: Secondary | ICD-10-CM | POA: Diagnosis not present

## 2019-10-15 ENCOUNTER — Encounter: Payer: Self-pay | Admitting: Family Medicine

## 2019-10-18 MED FILL — LATANOPROST 0.005% OPTH SOL: 0.005 | 75 days supply | Qty: 8 | Fill #1

## 2019-11-05 ENCOUNTER — Other Ambulatory Visit: Payer: Self-pay | Admitting: Family Medicine

## 2019-11-05 MED FILL — OMEGA-3-ACID ETHYL ESTERS 1: 1 | 30 days supply | Qty: 60 | Fill #0

## 2019-11-05 MED FILL — SM CALCIUM CITRATE + D3 630: 630MG/500IU | 60 days supply | Qty: 120 | Fill #1

## 2019-11-05 MED FILL — TIMOLOL 0.5% EYE DROPS: 0.5 | 50 days supply | Qty: 10 | Fill #5

## 2019-11-30 ENCOUNTER — Other Ambulatory Visit: Payer: Self-pay | Admitting: Family Medicine

## 2019-11-30 MED FILL — OMEGA-3-ACID ETHYL ESTERS 1: 1 | 30 days supply | Qty: 60 | Fill #1

## 2019-11-30 MED FILL — GLUCOSAMINE-CHONDROITIN-MSM: 500-400 MG | 40 days supply | Qty: 120 | Fill #0

## 2019-11-30 MED FILL — metFORMIN HCL 500 MG TABS: 500 | 90 days supply | Qty: 180 | Fill #0

## 2019-11-30 MED FILL — TURMERIC COMPLEX 500 MG CAP: 500-3 | 60 days supply | Qty: 120 | Fill #1

## 2019-11-30 MED FILL — ACETAMINOPHEN EXTRA STRENGT: 500 | 33 days supply | Qty: 100 | Fill #1

## 2019-11-30 MED FILL — ROSUVASTATIN CALCIUM 40 MG: 40 | 90 days supply | Qty: 90 | Fill #1

## 2019-12-03 ENCOUNTER — Other Ambulatory Visit: Payer: Self-pay

## 2019-12-03 MED ORDER — CALCIUM CITRATE-VITAMIN D 250-100 MG-UNIT PO TABS: 1.0000 | ORAL_TABLET | Freq: Two times a day (BID) | ORAL | 1 refills | Status: DC

## 2019-12-03 MED FILL — SM CALCIUM CITRATE + D3 630: 630MG/500IU | 60 days supply | Qty: 120 | Fill #0

## 2019-12-27 ENCOUNTER — Encounter: Payer: Self-pay | Admitting: Family Medicine

## 2019-12-27 MED FILL — OMEGA-3-ACID ETHYL ESTERS 1: 1 | 30 days supply | Qty: 60 | Fill #2

## 2019-12-27 MED FILL — TIMOLOL MALEATE 0.5 % SOLN: 0.5 | 50 days supply | Qty: 10 | Fill #6

## 2019-12-27 MED FILL — LATANOPROST 0.005% OPTH SOL: 0.005 | 75 days supply | Qty: 8 | Fill #2

## 2020-01-01 DIAGNOSIS — R928 Other abnormal and inconclusive findings on diagnostic imaging of breast: Secondary | ICD-10-CM | POA: Diagnosis not present

## 2020-01-01 LAB — HM MAMMOGRAPHY

## 2020-01-02 ENCOUNTER — Encounter: Payer: Self-pay | Admitting: Family Medicine

## 2020-02-12 ENCOUNTER — Other Ambulatory Visit: Payer: Self-pay | Admitting: Family Medicine

## 2020-02-12 MED FILL — SM CALCIUM CITRATE + D3 630: 315-250 | 60 days supply | Qty: 120 | Fill #1

## 2020-02-13 MED FILL — TURMERIC COMPLEX 500 MG CAP: 500-3 | 30 days supply | Qty: 120 | Fill #0

## 2020-02-19 ENCOUNTER — Other Ambulatory Visit: Payer: Self-pay | Admitting: Family Medicine

## 2020-02-19 MED FILL — GLUCOSAMINE-CHONDROITIN-MSM: 500-400 MG | 40 days supply | Qty: 120 | Fill #1

## 2020-02-19 MED FILL — METFORMIN HCL 500 MG TABS: 500 | 90 days supply | Qty: 180 | Fill #1

## 2020-02-19 MED FILL — OMEGA-3-ACID ETHYL ESTERS 1: 1 | 30 days supply | Qty: 60 | Fill #3

## 2020-02-19 MED FILL — TIMOLOL MALEATE 0.5 % SOLN: 0.5 | 50 days supply | Qty: 10 | Fill #7

## 2020-02-19 MED FILL — ROSUVASTATIN CALCIUM 40 MG: 40 | 90 days supply | Qty: 90 | Fill #0

## 2020-02-19 MED FILL — ASPIRIN 81 MG TBEC: 81 | 120 days supply | Qty: 120 | Fill #1

## 2020-02-19 MED FILL — VITAMIN D3 25 MCG (1000 UT): 25 MCG | 100 days supply | Qty: 100 | Fill #1

## 2020-03-11 DIAGNOSIS — H401113 Primary open-angle glaucoma, right eye, severe stage: Secondary | ICD-10-CM | POA: Diagnosis not present

## 2020-03-11 DIAGNOSIS — H2513 Age-related nuclear cataract, bilateral: Secondary | ICD-10-CM | POA: Diagnosis not present

## 2020-03-11 DIAGNOSIS — H524 Presbyopia: Secondary | ICD-10-CM | POA: Diagnosis not present

## 2020-03-11 DIAGNOSIS — H401122 Primary open-angle glaucoma, left eye, moderate stage: Secondary | ICD-10-CM | POA: Diagnosis not present

## 2020-03-11 LAB — HM DIABETES EYE EXAM

## 2020-03-12 ENCOUNTER — Encounter: Payer: Self-pay | Admitting: *Deleted

## 2020-03-13 ENCOUNTER — Other Ambulatory Visit: Payer: Self-pay | Admitting: Family Medicine

## 2020-04-07 ENCOUNTER — Other Ambulatory Visit (HOSPITAL_BASED_OUTPATIENT_CLINIC_OR_DEPARTMENT_OTHER): Payer: Self-pay | Admitting: Ophthalmology

## 2020-04-17 DIAGNOSIS — D485 Neoplasm of uncertain behavior of skin: Secondary | ICD-10-CM | POA: Diagnosis not present

## 2020-04-17 DIAGNOSIS — D1801 Hemangioma of skin and subcutaneous tissue: Secondary | ICD-10-CM | POA: Diagnosis not present

## 2020-04-17 DIAGNOSIS — L905 Scar conditions and fibrosis of skin: Secondary | ICD-10-CM | POA: Diagnosis not present

## 2020-04-17 DIAGNOSIS — Z85828 Personal history of other malignant neoplasm of skin: Secondary | ICD-10-CM | POA: Diagnosis not present

## 2020-04-18 ENCOUNTER — Other Ambulatory Visit: Payer: Self-pay | Admitting: Family Medicine

## 2020-04-18 MED FILL — SM CALCIUM CITRATE + D3 630: 315-250 | 60 days supply | Qty: 120 | Fill #0

## 2020-04-18 MED FILL — TURMERIC COMPLEX 500 MG CAP: 500-3 | 30 days supply | Qty: 120 | Fill #1

## 2020-04-18 MED FILL — OMEGA-3-ACID ETHYL ESTERS 1: 1 | 30 days supply | Qty: 60 | Fill #1

## 2020-05-19 ENCOUNTER — Other Ambulatory Visit: Payer: Self-pay | Admitting: Family Medicine

## 2020-05-19 MED FILL — OMEGA-3-ACID ETHYL ESTERS 1: 1 | 30 days supply | Qty: 60 | Fill #2

## 2020-05-19 MED FILL — ROSUVASTATIN CALCIUM 40 MG: 40 | 90 days supply | Qty: 90 | Fill #1

## 2020-05-19 MED FILL — VITAMIN D3 25 MCG (1000 UT): 25 MCG | 100 days supply | Qty: 100 | Fill #0

## 2020-06-16 ENCOUNTER — Other Ambulatory Visit: Payer: Self-pay | Admitting: Family Medicine

## 2020-06-16 MED FILL — VITAMIN D3 25 MCG (1000 UT): 25 MCG | 100 days supply | Qty: 100 | Fill #1

## 2020-06-16 MED FILL — ASPIRIN 81 MG TBEC: 81 | 120 days supply | Qty: 120 | Fill #0

## 2020-06-16 MED FILL — ONE TOUCH ULTRA TEST STRIPS: 50 days supply | Qty: 100 | Fill #1

## 2020-06-16 MED FILL — METFORMIN HCL 500 MG TABS: 500 | 30 days supply | Qty: 60 | Fill #0

## 2020-06-16 MED FILL — OMEGA-3-ACID ETHYL ESTERS 1: 1 | 30 days supply | Qty: 60 | Fill #3

## 2020-06-19 ENCOUNTER — Other Ambulatory Visit: Payer: Self-pay | Admitting: Family Medicine

## 2020-06-19 NOTE — Telephone Encounter (Signed)
Pt is needing refill on her meds, pt stated has 30 days left on her meds but needing refill until her next appt on Oct. 29, 2021, provider does not have a sooner appt and pt was mentioned that she needed appt with provider to get her next refills. Please advise.

## 2020-06-20 ENCOUNTER — Other Ambulatory Visit: Payer: Self-pay | Admitting: Family Medicine

## 2020-06-20 MED ORDER — METFORMIN HCL 500 MG PO TABS: 500.0000 mg | ORAL_TABLET | Freq: Two times a day (BID) | ORAL | 2 refills | Status: DC

## 2020-06-20 NOTE — Telephone Encounter (Signed)
8/20 Sent requested medication to pharmacy. [place]

## 2020-06-25 MED FILL — TIMOLOL MALEATE 0.5 % SOLN: 0.5 | 75 days supply | Qty: 15 | Fill #1

## 2020-06-30 ENCOUNTER — Other Ambulatory Visit: Payer: Self-pay | Admitting: Family Medicine

## 2020-06-30 MED FILL — SM CALCIUM CITRATE + D3 630: 315-250 | 60 days supply | Qty: 120 | Fill #1

## 2020-06-30 MED FILL — TURMERIC COMPLEX 500 MG CAP: 500-3 | 60 days supply | Qty: 120 | Fill #0

## 2020-07-01 MED FILL — LATANOPROST 0.005% OPTH SOL: 0.005 | 75 days supply | Qty: 8 | Fill #4

## 2020-07-09 ENCOUNTER — Other Ambulatory Visit: Payer: Self-pay | Admitting: Family Medicine

## 2020-07-09 ENCOUNTER — Encounter: Payer: Self-pay | Admitting: Family Medicine

## 2020-07-09 DIAGNOSIS — E119 Type 2 diabetes mellitus without complications: Secondary | ICD-10-CM

## 2020-07-09 DIAGNOSIS — R030 Elevated blood-pressure reading, without diagnosis of hypertension: Secondary | ICD-10-CM

## 2020-07-09 DIAGNOSIS — E782 Mixed hyperlipidemia: Secondary | ICD-10-CM

## 2020-07-11 NOTE — Telephone Encounter (Signed)
Left detailed message for patient to call and schedule lab only appointment

## 2020-07-16 DIAGNOSIS — H401113 Primary open-angle glaucoma, right eye, severe stage: Secondary | ICD-10-CM | POA: Diagnosis not present

## 2020-07-16 DIAGNOSIS — H401122 Primary open-angle glaucoma, left eye, moderate stage: Secondary | ICD-10-CM | POA: Diagnosis not present

## 2020-07-18 ENCOUNTER — Other Ambulatory Visit: Payer: Self-pay | Admitting: Family Medicine

## 2020-07-18 MED FILL — OMEGA-3-ACID ETHYL ESTERS 1: 1 | 60 days supply | Qty: 60 | Fill #0

## 2020-07-18 MED FILL — METFORMIN HCL 500 MG TABS: 500 | 30 days supply | Qty: 60 | Fill #0

## 2020-07-28 MED FILL — FLUAD QUADRIVALENT 0.5 ML P: 0.5 | 1 days supply | Qty: 1 | Fill #0

## 2020-07-28 MED FILL — FLUOCINOLONE ACETONIDE 0.01: 0.01 | 90 days supply | Qty: 20 | Fill #1

## 2020-08-13 MED FILL — METFORMIN HCL 500 MG TABS: 500 | 30 days supply | Qty: 60 | Fill #1

## 2020-08-27 ENCOUNTER — Other Ambulatory Visit: Payer: Self-pay

## 2020-08-27 ENCOUNTER — Other Ambulatory Visit: Payer: PPO

## 2020-08-27 DIAGNOSIS — R030 Elevated blood-pressure reading, without diagnosis of hypertension: Secondary | ICD-10-CM | POA: Diagnosis not present

## 2020-08-27 DIAGNOSIS — E782 Mixed hyperlipidemia: Secondary | ICD-10-CM | POA: Diagnosis not present

## 2020-08-27 DIAGNOSIS — E119 Type 2 diabetes mellitus without complications: Secondary | ICD-10-CM

## 2020-08-28 LAB — COMPREHENSIVE METABOLIC PANEL
AG Ratio: 2 (calc) (ref 1.0–2.5)
ALT: 19 U/L (ref 6–29)
AST: 19 U/L (ref 10–35)
Albumin: 4.7 g/dL (ref 3.6–5.1)
Alkaline phosphatase (APISO): 56 U/L (ref 37–153)
BUN: 15 mg/dL (ref 7–25)
CO2: 26 mmol/L (ref 20–32)
Calcium: 9.6 mg/dL (ref 8.6–10.4)
Chloride: 104 mmol/L (ref 98–110)
Creat: 0.83 mg/dL (ref 0.50–0.99)
Globulin: 2.3 g/dL (calc) (ref 1.9–3.7)
Glucose, Bld: 145 mg/dL — ABNORMAL HIGH (ref 65–99)
Potassium: 4.6 mmol/L (ref 3.5–5.3)
Sodium: 138 mmol/L (ref 135–146)
Total Bilirubin: 1.1 mg/dL (ref 0.2–1.2)
Total Protein: 7 g/dL (ref 6.1–8.1)

## 2020-08-28 LAB — CBC
HCT: 43.7 % (ref 35.0–45.0)
Hemoglobin: 14.2 g/dL (ref 11.7–15.5)
MCH: 28 pg (ref 27.0–33.0)
MCHC: 32.5 g/dL (ref 32.0–36.0)
MCV: 86.2 fL (ref 80.0–100.0)
MPV: 9.9 fL (ref 7.5–12.5)
Platelets: 241 10*3/uL (ref 140–400)
RBC: 5.07 10*6/uL (ref 3.80–5.10)
RDW: 12.7 % (ref 11.0–15.0)
WBC: 5.9 10*3/uL (ref 3.8–10.8)

## 2020-08-28 LAB — LIPID PANEL
Cholesterol: 155 mg/dL (ref <200)
HDL: 58 mg/dL (ref >=50)
LDL Cholesterol (Calc): 77 mg/dL (calc)
Non-HDL Cholesterol (Calc): 97 mg/dL (calc) (ref <130)
Total CHOL/HDL Ratio: 2.7 (calc) (ref <5.0)
Triglycerides: 112 mg/dL (ref <150)

## 2020-08-28 LAB — TSH: TSH: 2.59 mIU/L (ref 0.40–4.50)

## 2020-08-28 LAB — HEMOGLOBIN A1C
Hgb A1c MFr Bld: 7.2 % of total Hgb — ABNORMAL HIGH (ref <5.7)
Mean Plasma Glucose: 160 (calc)
eAG (mmol/L): 8.9 (calc)

## 2020-08-29 ENCOUNTER — Ambulatory Visit: Payer: PPO | Attending: Internal Medicine

## 2020-08-29 ENCOUNTER — Other Ambulatory Visit: Payer: Self-pay

## 2020-08-29 ENCOUNTER — Other Ambulatory Visit (HOSPITAL_BASED_OUTPATIENT_CLINIC_OR_DEPARTMENT_OTHER): Payer: Self-pay | Admitting: Internal Medicine

## 2020-08-29 ENCOUNTER — Telehealth (INDEPENDENT_AMBULATORY_CARE_PROVIDER_SITE_OTHER): Payer: PPO | Admitting: Family Medicine

## 2020-08-29 VITALS — HR 90 | Temp 97.9°F

## 2020-08-29 DIAGNOSIS — M8580 Other specified disorders of bone density and structure, unspecified site: Secondary | ICD-10-CM | POA: Diagnosis not present

## 2020-08-29 DIAGNOSIS — R030 Elevated blood-pressure reading, without diagnosis of hypertension: Secondary | ICD-10-CM | POA: Diagnosis not present

## 2020-08-29 DIAGNOSIS — E782 Mixed hyperlipidemia: Secondary | ICD-10-CM

## 2020-08-29 DIAGNOSIS — E119 Type 2 diabetes mellitus without complications: Secondary | ICD-10-CM

## 2020-08-29 DIAGNOSIS — Z23 Encounter for immunization: Secondary | ICD-10-CM

## 2020-08-29 NOTE — Patient Instructions (Signed)
Lab appt Jan 28

## 2020-08-31 NOTE — Progress Notes (Signed)
Virtual Visit via Video Note  I connected with Priscilla Butler on 08/29/20 at  9:40 AM EDT by a video enabled telemedicine application and verified that I am speaking with the correct person using two identifiers.  Location: Patient: home, patient and provider are in visit Provider: home   I discussed the limitations of evaluation and management by telemedicine and the availability of in person appointments. The patient expressed understanding and agreed to proceed. Corinna Capra, CMA was able to get the patient set up on a video visit    Subjective:    Patient ID: Priscilla Butler, female    DOB: [birth year], 69 y.o.   MRN: [id number]  Chief Complaint  Patient presents with  . Follow-up    med refill    HPI Patient is in today for follow up on chronic medical concerns. No recent febrile illness or hospitalizations. No polyuria or polydipsia. Her husband was admitted to the hospital last night with a pulmonary embolism. She is stressed but is heading to the hospital soon. Denies CP/palp/SOB/HA/congestion/fevers/GI or GU c/o. Taking meds as prescribed  Past Medical History:  Diagnosis Date  . Breast cancer ([place]) 01/2008   Stage I, right breast  . 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 DeQuervain's disease (tenosynovitis)   . DM (diabetes mellitus), type 2 ([place])   . H/O measles   . H/O mumps   . History of chicken pox   . Hx of TB skin testing   . Hyperglycemia   . Hyperlipidemia   . Menopause   . Osteopenia   . Otitis externa of left ear 01/30/2017  . Preventative health care 01/19/2016  . Welcome to Medicare preventive visit 01/27/2017    Past Surgical History:  Procedure Laterality Date  . BREAST LUMPECTOMY  03/2008   Right breast  . MOHS SURGERY  2013   BCC right upper lip, [place] skin surgery  . SKIN SURGERY     facial  skin cancer  . WISDOM TOOTH EXTRACTION  1972    Family History  Problem Relation Age of Onset  . Breast cancer Maternal Grandmother    . Cancer Maternal Grandmother 35       ish  . Cancer Maternal Grandfather        lung  . Hypertension Mother   . Hyperlipidemia Mother   . Arthritis Mother   . Other Paternal Grandmother     Social History   Socioeconomic History  . Marital status: Married    Spouse name: [name]  . Number of children: 1  . Years of education: BA  . Highest education level: Not on file  Occupational History  . Occupation: Retired    Fish farm manager: UNEMPLOYED  Tobacco Use  . Smoking status: Former Smoker    Quit date: 11/02/1999    Years since quitting: 20.8  . Smokeless tobacco: Never Used  Substance and Sexual Activity  . Alcohol use: Yes    Alcohol/week: 3.0 standard drinks    Types: 3 Cans of beer per week  . Drug use: No  . Sexual activity: Yes    Partners: Male    Birth control/protection: Post-menopausal    Comment: lives with husband, retired from Press photographer, Therapist, music, No dietary restrictions  Other Topics Concern  . Not on file  Social History Narrative  . Not on file   Social Determinants of Health   Financial Resource Strain:   . Difficulty of Paying Living Expenses: Not on file  Food Insecurity:   .  Worried About Charity fundraiser in the Last Year: Not on file  . Ran Out of Food in the Last Year: Not on file  Transportation Needs:   . Lack of Transportation (Medical): Not on file  . Lack of Transportation (Non-Medical): Not on file  Physical Activity:   . Days of Exercise per Week: Not on file  . Minutes of Exercise per Session: Not on file  Stress:   . Feeling of Stress : Not on file  Social Connections:   . Frequency of Communication with Friends and Family: Not on file  . Frequency of Social Gatherings with Friends and Family: Not on file  . Attends Religious Services: Not on file  . Active Member of Clubs or Organizations: Not on file  . Attends Archivist Meetings: Not on file  . Marital Status: Not on file  Intimate Partner Violence:   . Fear  of Current or Ex-Partner: Not on file  . Emotionally Abused: Not on file  . Physically Abused: Not on file  . Sexually Abused: Not on file    Outpatient Medications Prior to Visit  Medication Sig Dispense Refill  . acetaminophen (TYLENOL) 500 MG tablet Take 1 tablet (500 mg total) by mouth 3 (three) times daily as needed. 90 tablet 1  . aspirin 81 MG EC tablet TAKE 1 TABLET (81 MG TOTAL) BY MOUTH DAILY. 120 tablet 0  . cholecalciferol (VITAMIN D) 25 MCG (1000 UNIT) tablet TAKE 1 TABLET (1,000 UNITS TOTAL) BY MOUTH DAILY. 100 tablet 1  . CRANBERRY PO Take 84 mg by mouth daily.    Marland Kitchen glucosamine-chondroitin (MAX GLUCOSAMINE CHONDROITIN) 500-400 MG tablet Take 1 tablet by mouth 3 (three) times daily. 90 tablet 1  . latanoprost (XALATAN) 0.005 % ophthalmic solution   99  . metFORMIN (GLUCOPHAGE) 500 MG tablet Take 1 tablet (500 mg total) by mouth 2 (two) times daily with a meal. 60 tablet 2  . Multiple Vitamin (MULTIVITAMIN) tablet Take 1 tablet by mouth daily.      . NEOMYCIN-POLYMYXIN-HYDROCORTISONE (CORTISPORIN) 1 % SOLN OTIC solution Place 5 drops into the left ear 2 (two) times daily. 10 mL 0  . omega-3 acid ethyl esters (LOVAZA) 1 g capsule TAKE 1 CAPSULE BY MOUTH TWICE DAILY 60 capsule 3  . Omega-3 Fatty Acids (FISH OIL) 1000 MG CAPS Take 1 capsule by mouth 2 (two) times daily.      Glory Rosebush ULTRA test strip USE TO TEST BLOOD SUGAR TWICE A DAY AS DIRECTED 100 strip 1  . Probiotic Product (PROBIOTIC DAILY) CAPS Take 1 capsule by mouth daily. NOW company    . Psyllium (METAMUCIL SMOOTH TEXTURE) 28.3 % POWD 1 tsp daily in water    . rosuvastatin (CRESTOR) 40 MG tablet TAKE 1 TABLET BY MOUTH DAILY. 90 tablet 1  . SM CALCIUM CITRATE+VIT D3 MAX 315-250 MG-UNIT TABS TAKE 1 TABLET BY MOUTH TWICE DAILY 120 tablet 1  . timolol (TIMOPTIC) 0.5 % ophthalmic solution     . Turmeric 400 MG CAPS Take 450 mg by mouth 2 (two) times daily. 90 capsule 1  . TURMERIC COMPLEX/BLACK PEPPER 500-3 MG CAPS TAKE 1  CAPSULE BY MOUTH 2 (TWO) TIMES DAILY. 120 capsule 1  . cephALEXin (KEFLEX) 500 MG capsule Take 1 capsule (500 mg total) by mouth 4 (four) times daily. 14 capsule 0  . predniSONE (DELTASONE) 10 MG tablet TAKE 3 TABLETS PO QD FOR 3 DAYS THEN TAKE 2 TABLETS PO QD FOR 3 DAYS THEN  TAKE 1 TABLET PO QD FOR 3 DAYS THEN TAKE 1/2 TAB PO QD FOR 3 DAYS 20 tablet 0   No facility-administered medications prior to visit.    No Known Allergies  Review of Systems  Constitutional: Negative for fever and malaise/fatigue.  HENT: Negative for congestion.   Eyes: Negative for blurred vision.  Respiratory: Negative for shortness of breath.   Cardiovascular: Negative for chest pain, palpitations and leg swelling.  Gastrointestinal: Negative for abdominal pain, blood in stool and nausea.  Genitourinary: Negative for dysuria and frequency.  Musculoskeletal: Negative for falls.  Skin: Negative for rash.  Neurological: Negative for dizziness, loss of consciousness and headaches.  Endo/Heme/Allergies: Negative for environmental allergies.  Psychiatric/Behavioral: Negative for depression. The patient is not nervous/anxious.        Objective:    Physical Exam Constitutional:      Appearance: Normal appearance. She is not ill-appearing.  HENT:     Head: Normocephalic and atraumatic.     Right Ear: External ear normal.     Left Ear: External ear normal.     Nose: Nose normal.  Pulmonary:     Effort: Pulmonary effort is normal.  Neurological:     Mental Status: She is alert and oriented to person, place, and time.  Psychiatric:        Behavior: Behavior normal.     Pulse 90   Temp 97.9 F (36.6 C) (Tympanic)   SpO2 97%  Wt Readings from Last 3 Encounters:  08/15/19 165 lb (74.8 kg)  05/07/19 165 lb (74.8 kg)  11/14/18 164 lb 12.8 oz (74.8 kg)    Diabetic Foot Exam - Simple   No data filed     Lab Results  Component Value Date   WBC 5.9 08/27/2020   HGB 14.2 08/27/2020   HCT 43.7  08/27/2020   PLT 241 08/27/2020   GLUCOSE 145 (H) 08/27/2020   CHOL 155 08/27/2020   TRIG 112 08/27/2020   HDL 58 08/27/2020   LDLCALC 77 08/27/2020   ALT 19 08/27/2020   AST 19 08/27/2020   NA 138 08/27/2020   K 4.6 08/27/2020   CL 104 08/27/2020   CREATININE 0.83 08/27/2020   BUN 15 08/27/2020   CO2 26 08/27/2020   TSH 2.59 08/27/2020   HGBA1C 7.2 (H) 08/27/2020   MICROALBUR 2.6 01/27/2017    Lab Results  Component Value Date   TSH 2.59 08/27/2020   Lab Results  Component Value Date   WBC 5.9 08/27/2020   HGB 14.2 08/27/2020   HCT 43.7 08/27/2020   MCV 86.2 08/27/2020   PLT 241 08/27/2020   Lab Results  Component Value Date   NA 138 08/27/2020   K 4.6 08/27/2020   CO2 26 08/27/2020   GLUCOSE 145 (H) 08/27/2020   BUN 15 08/27/2020   CREATININE 0.83 08/27/2020   BILITOT 1.1 08/27/2020   ALKPHOS 59 05/02/2019   AST 19 08/27/2020   ALT 19 08/27/2020   PROT 7.0 08/27/2020   ALBUMIN 4.5 05/02/2019   CALCIUM 9.6 08/27/2020   GFR 68.37 05/02/2019   Lab Results  Component Value Date   CHOL 155 08/27/2020   Lab Results  Component Value Date   HDL 58 08/27/2020   Lab Results  Component Value Date   LDLCALC 77 08/27/2020   Lab Results  Component Value Date   TRIG 112 08/27/2020   Lab Results  Component Value Date   CHOLHDL 2.7 08/27/2020   Lab Results  Component Value  Date   HGBA1C 7.2 (H) 08/27/2020       Assessment & Plan:   Problem List Items Addressed This Visit    Hyperlipidemia    Tolerating statin, encouraged heart healthy diet, avoid trans fats, minimize simple carbs and saturated fats. Increase exercise as tolerated      Relevant Orders   Lipid panel   DM (diabetes mellitus), type 2 ([place]) - Primary    hgba1c acceptable, minimize simple carbs. Increase exercise as tolerated. Continue current meds      Relevant Orders   Hemoglobin A1c   Comprehensive metabolic panel   Osteopenia    Maintain adequate exercise, calcium and vitamin  d intake      Elevated blood pressure reading   Relevant Orders   CBC   TSH      I have discontinued Philena R. Cusic's cephALEXin and predniSONE. I am also having her maintain her multivitamin, Fish Oil, CRANBERRY PO, latanoprost, NEOMYCIN-POLYMYXIN-HYDROCORTISONE, Probiotic Daily, Psyllium, OneTouch Ultra, timolol, Turmeric, glucosamine-chondroitin, acetaminophen, rosuvastatin, SM Calcium Citrate+Vit D3 Max, cholecalciferol, aspirin, metFORMIN, Turmeric Complex/Black Pepper, and omega-3 acid ethyl esters.  No orders of the defined types were placed in this encounter.   I discussed the assessment and treatment plan with the patient. The patient was provided an opportunity to ask questions and all were answered. The patient agreed with the plan and demonstrated an understanding of the instructions.   The patient was advised to call back or seek an in-person evaluation if the symptoms worsen or if the condition fails to improve as anticipated.  I provided 15 minutes of face-to-face time during this encounter.   Penni Homans, MD

## 2020-08-31 NOTE — Assessment & Plan Note (Signed)
hgba1c acceptable, minimize simple carbs. Increase exercise as tolerated. Continue current meds 

## 2020-08-31 NOTE — Assessment & Plan Note (Signed)
Tolerating statin, encouraged heart healthy diet, avoid trans fats, minimize simple carbs and saturated fats. Increase exercise as tolerated 

## 2020-08-31 NOTE — Assessment & Plan Note (Signed)
Maintain adequate exercise, calcium and vitamin d intake 

## 2020-09-01 ENCOUNTER — Other Ambulatory Visit: Payer: Self-pay | Admitting: Family Medicine

## 2020-09-01 MED FILL — OMEGA-3-ACID ETHYL ESTERS 1: 1 | 30 days supply | Qty: 60 | Fill #1

## 2020-09-01 MED FILL — TURMERIC COMPLEX 500 MG CAP: 500-3 | 60 days supply | Qty: 120 | Fill #1

## 2020-09-01 NOTE — Progress Notes (Signed)
   Covid-19 Vaccination Clinic  Name:  Priscilla Butler    MRN: [id number] DOB: 1951-09-12  09/01/2020  Priscilla Butler was observed post Covid-19 immunization for 15 minutes without incident. She was provided with Vaccine Information Sheet and instruction to access the V-Safe system.   Priscilla Butler was instructed to call 911 with any severe reactions post vaccine: Marland Kitchen Difficulty breathing  . Swelling of face and throat  . A fast heartbeat  . A bad rash all over body  . Dizziness and weakness

## 2020-09-02 ENCOUNTER — Other Ambulatory Visit (HOSPITAL_BASED_OUTPATIENT_CLINIC_OR_DEPARTMENT_OTHER): Payer: Self-pay | Admitting: Ophthalmology

## 2020-09-02 MED FILL — SM CALCIUM CITRATE + D3 630: 315-250 | 60 days supply | Qty: 120 | Fill #0

## 2020-09-02 MED FILL — LATANOPROST 0.005% OPTH SOL: 0.005 | 75 days supply | Qty: 8 | Fill #0

## 2020-09-04 MED FILL — MODERNA COVID-19 VACCINE 10: 100 | 1 days supply | Qty: 0 | Fill #0

## 2020-09-05 MED FILL — TIMOLOL MALEATE 0.5 % SOLN: 0.5 | 75 days supply | Qty: 15 | Fill #2

## 2020-09-11 ENCOUNTER — Other Ambulatory Visit: Payer: Self-pay | Admitting: Family Medicine

## 2020-09-11 MED FILL — VITAMIN D3 25 MCG (1000 UT): 25 MCG | 100 days supply | Qty: 100 | Fill #0

## 2020-09-18 ENCOUNTER — Other Ambulatory Visit: Payer: Self-pay | Admitting: Family Medicine

## 2020-09-18 MED FILL — ROSUVASTATIN CALCIUM 40 MG: 40 | 90 days supply | Qty: 90 | Fill #0

## 2020-09-18 MED FILL — METFORMIN HCL 500 MG TABS: 500 | 30 days supply | Qty: 60 | Fill #2

## 2020-09-22 ENCOUNTER — Other Ambulatory Visit: Payer: Self-pay | Admitting: Family Medicine

## 2020-09-22 NOTE — Telephone Encounter (Signed)
We have filled twice now for her in the past.  Last fill was 2019.  Do you want me to have her come in?  Or is this a situation you know about?

## 2020-09-22 NOTE — Telephone Encounter (Signed)
I went ahead and sent it in but let her know if her ear does not get better then she should come in to let us look at it.

## 2020-09-23 ENCOUNTER — Other Ambulatory Visit: Payer: Self-pay | Admitting: Family Medicine

## 2020-09-23 MED ORDER — METFORMIN HCL 500 MG PO TABS
500.0000 mg | ORAL_TABLET | Freq: Two times a day (BID) | ORAL | 1 refills | Status: DC
Start: 2020-09-23 — End: 2020-09-23

## 2020-09-23 NOTE — Telephone Encounter (Signed)
Patient did not need, she is not sure why they sent.  Rx has been cancelled.  She had needed a refill for a 90 day supply of her metformin.  Rx sent in.

## 2020-09-23 NOTE — Addendum Note (Signed)
Addended by: Kem Boroughs D on: 09/23/2020 11:44 AM   Modules accepted: Orders

## 2020-10-14 ENCOUNTER — Other Ambulatory Visit: Payer: Self-pay | Admitting: Family Medicine

## 2020-10-14 MED FILL — METFORMIN HCL 500 MG TABS: 500 | 90 days supply | Qty: 180 | Fill #0

## 2020-10-15 ENCOUNTER — Other Ambulatory Visit: Payer: Self-pay | Admitting: Family Medicine

## 2020-10-15 MED FILL — ASPIRIN EC 81 MG TABLET: 81 | 100 days supply | Qty: 100 | Fill #0

## 2020-10-15 MED FILL — TURMERIC COMPLEX 500 MG CAP: 500-3 | 60 days supply | Qty: 120 | Fill #0

## 2020-11-10 MED FILL — LATANOPROST 0.005% OPTH SOL: 0.005 | 75 days supply | Qty: 8 | Fill #1

## 2020-11-13 MED FILL — TIMOLOL MALEATE 0.5 % SOLN: 0.5 | 75 days supply | Qty: 15 | Fill #3

## 2020-11-19 ENCOUNTER — Other Ambulatory Visit (HOSPITAL_BASED_OUTPATIENT_CLINIC_OR_DEPARTMENT_OTHER): Payer: Self-pay | Admitting: Ophthalmology

## 2020-11-19 DIAGNOSIS — H401122 Primary open-angle glaucoma, left eye, moderate stage: Secondary | ICD-10-CM | POA: Diagnosis not present

## 2020-11-19 DIAGNOSIS — H401113 Primary open-angle glaucoma, right eye, severe stage: Secondary | ICD-10-CM | POA: Diagnosis not present

## 2020-11-25 NOTE — Addendum Note (Signed)
Addended by: Kelle Darting A on: 11/25/2020 02:49 PM   Modules accepted: Orders

## 2020-11-28 ENCOUNTER — Other Ambulatory Visit: Payer: Self-pay

## 2020-11-28 ENCOUNTER — Other Ambulatory Visit (INDEPENDENT_AMBULATORY_CARE_PROVIDER_SITE_OTHER): Payer: PPO

## 2020-11-28 DIAGNOSIS — E119 Type 2 diabetes mellitus without complications: Secondary | ICD-10-CM

## 2020-11-28 DIAGNOSIS — R030 Elevated blood-pressure reading, without diagnosis of hypertension: Secondary | ICD-10-CM

## 2020-11-28 DIAGNOSIS — E782 Mixed hyperlipidemia: Secondary | ICD-10-CM | POA: Diagnosis not present

## 2020-11-28 LAB — CBC
HCT: 40.9 % (ref 36.0–46.0)
Hemoglobin: 13.6 g/dL (ref 12.0–15.0)
MCHC: 33.2 g/dL (ref 30.0–36.0)
MCV: 86.8 fl (ref 78.0–100.0)
Platelets: 278 10*3/uL (ref 150.0–400.0)
RBC: 4.71 Mil/uL (ref 3.87–5.11)
RDW: 13.4 % (ref 11.5–15.5)
WBC: 6.1 10*3/uL (ref 4.0–10.5)

## 2020-11-28 LAB — COMPREHENSIVE METABOLIC PANEL
ALT: 27 U/L (ref 0–35)
AST: 22 U/L (ref 0–37)
Albumin: 4.7 g/dL (ref 3.5–5.2)
Alkaline Phosphatase: 63 U/L (ref 39–117)
BUN: 22 mg/dL (ref 6–23)
CO2: 27 mEq/L (ref 19–32)
Calcium: 9.7 mg/dL (ref 8.4–10.5)
Chloride: 102 mEq/L (ref 96–112)
Creatinine, Ser: 0.79 mg/dL (ref 0.40–1.20)
GFR: 76.25 mL/min (ref >60.00)
Glucose, Bld: 162 mg/dL — ABNORMAL HIGH (ref 70–99)
Potassium: 4.2 mEq/L (ref 3.5–5.1)
Sodium: 139 mEq/L (ref 135–145)
Total Bilirubin: 1.6 mg/dL — ABNORMAL HIGH (ref 0.2–1.2)
Total Protein: 7 g/dL (ref 6.0–8.3)

## 2020-11-28 LAB — LIPID PANEL
Cholesterol: 154 mg/dL (ref 0–200)
HDL: 51.8 mg/dL (ref >39.00)
LDL Cholesterol: 76 mg/dL (ref 0–99)
NonHDL: 101.9
Total CHOL/HDL Ratio: 3
Triglycerides: 131 mg/dL (ref 0.0–149.0)
VLDL: 26.2 mg/dL (ref 0.0–40.0)

## 2020-11-28 LAB — HEMOGLOBIN A1C: Hgb A1c MFr Bld: 7.7 % — ABNORMAL HIGH (ref 4.6–6.5)

## 2020-11-28 LAB — TSH: TSH: 2.3 u[IU]/mL (ref 0.35–4.50)

## 2020-12-05 ENCOUNTER — Other Ambulatory Visit: Payer: Self-pay

## 2020-12-09 ENCOUNTER — Ambulatory Visit (INDEPENDENT_AMBULATORY_CARE_PROVIDER_SITE_OTHER): Payer: PPO | Admitting: Family Medicine

## 2020-12-09 ENCOUNTER — Other Ambulatory Visit: Payer: Self-pay | Admitting: Family Medicine

## 2020-12-09 ENCOUNTER — Encounter: Payer: Self-pay | Admitting: Family Medicine

## 2020-12-09 ENCOUNTER — Ambulatory Visit (HOSPITAL_BASED_OUTPATIENT_CLINIC_OR_DEPARTMENT_OTHER)
Admission: RE | Admit: 2020-12-09 | Discharge: 2020-12-09 | Disposition: A | Discharge status: Home / Self Care | Payer: PPO | Source: Ambulatory Visit | Attending: Family Medicine | Admitting: Family Medicine

## 2020-12-09 ENCOUNTER — Other Ambulatory Visit: Payer: Self-pay

## 2020-12-09 VITALS — BP 132/84 | HR 95 | Temp 97.9°F | Resp 16 | Ht 62.0 in | Wt 164.2 lb

## 2020-12-09 DIAGNOSIS — Z853 Personal history of malignant neoplasm of breast: Secondary | ICD-10-CM

## 2020-12-09 DIAGNOSIS — M25512 Pain in left shoulder: Secondary | ICD-10-CM | POA: Insufficient documentation

## 2020-12-09 DIAGNOSIS — R030 Elevated blood-pressure reading, without diagnosis of hypertension: Secondary | ICD-10-CM

## 2020-12-09 DIAGNOSIS — E782 Mixed hyperlipidemia: Secondary | ICD-10-CM | POA: Diagnosis not present

## 2020-12-09 DIAGNOSIS — G8929 Other chronic pain: Secondary | ICD-10-CM | POA: Diagnosis not present

## 2020-12-09 DIAGNOSIS — M19012 Primary osteoarthritis, left shoulder: Secondary | ICD-10-CM | POA: Diagnosis not present

## 2020-12-09 DIAGNOSIS — Z124 Encounter for screening for malignant neoplasm of cervix: Secondary | ICD-10-CM | POA: Insufficient documentation

## 2020-12-09 DIAGNOSIS — Z780 Asymptomatic menopausal state: Secondary | ICD-10-CM | POA: Diagnosis not present

## 2020-12-09 DIAGNOSIS — L729 Follicular cyst of the skin and subcutaneous tissue, unspecified: Secondary | ICD-10-CM

## 2020-12-09 DIAGNOSIS — Z0000 Encounter for general adult medical examination without abnormal findings: Secondary | ICD-10-CM

## 2020-12-09 DIAGNOSIS — E119 Type 2 diabetes mellitus without complications: Secondary | ICD-10-CM | POA: Diagnosis not present

## 2020-12-09 DIAGNOSIS — M8580 Other specified disorders of bone density and structure, unspecified site: Secondary | ICD-10-CM | POA: Diagnosis not present

## 2020-12-09 MED ORDER — LISINOPRIL 5 MG PO TABS: 5.0000 mg | ORAL_TABLET | Freq: Every day | ORAL | 1 refills | Status: DC

## 2020-12-09 MED FILL — LISINOPRIL 5 MG TABLET: 5 | 90 days supply | Qty: 90 | Fill #0

## 2020-12-09 NOTE — Patient Instructions (Addendum)
LOVFIE or Saxenda for weight loss and sugar and/or Metformin increased  Gilbert's phenomenon Preventive Care 70 Years and Older, Female Preventive care refers to lifestyle choices and visits with your health care provider that can promote health and wellness. This includes:  A yearly physical exam. This is also called an annual wellness visit.  Regular dental and eye exams.  Immunizations.  Screening for certain conditions.  Healthy lifestyle choices, such as: ? Eating a healthy diet. ? Getting regular exercise. ? Not using drugs or products that contain nicotine and tobacco. ? Limiting alcohol use. What can I expect for my preventive care visit? Physical exam Your health care provider will check your:  Height and weight. These may be used to calculate your BMI (body mass index). BMI is a measurement that tells if you are at a healthy weight.  Heart rate and blood pressure.  Body temperature.  Skin for abnormal spots. Counseling Your health care provider may ask you questions about your:  Past medical problems.  Family's medical history.  Alcohol, tobacco, and drug use.  Emotional well-being.  Home life and relationship well-being.  Sexual activity.  Diet, exercise, and sleep habits.  History of falls.  Memory and ability to understand (cognition).  Work and work Statistician.  Pregnancy and menstrual history.  Access to firearms. What immunizations do I need? Vaccines are usually given at various ages, according to a schedule. Your health care provider will recommend vaccines for you based on your age, medical history, and lifestyle or other factors, such as travel or where you work.   What tests do I need? Blood tests  Lipid and cholesterol levels. These may be checked every 5 years, or more often depending on your overall health.  Hepatitis C test.  Hepatitis B test. Screening  Lung cancer screening. You may have this screening every year starting  at age 70 if you have a 30-pack-year history of smoking and currently smoke or have quit within the past 15 years.  Colorectal cancer screening. ? All adults should have this screening starting at age 70 and continuing until age 70. ? Your health care provider may recommend screening at age 70 if you are at increased risk. ? You will have tests every 1-10 years, depending on your results and the type of screening test.  Diabetes screening. ? This is done by checking your blood sugar (glucose) after you have not eaten for a while (fasting). ? You may have this done every 1-3 years.  Mammogram. ? This may be done every 1-2 years. ? Talk with your health care provider about how often you should have regular mammograms.  Abdominal aortic aneurysm (AAA) screening. You may need this if you are a current or former smoker.  BRCA-related cancer screening. This may be done if you have a family history of breast, ovarian, tubal, or peritoneal cancers. Other tests  STD (sexually transmitted disease) testing, if you are at risk.  Bone density scan. This is done to screen for osteoporosis. You may have this done starting at age 70. Talk with your health care provider about your test results, treatment options, and if necessary, the need for more tests. Follow these instructions at home: Eating and drinking  Eat a diet that includes fresh fruits and vegetables, whole grains, lean protein, and low-fat dairy products. Limit your intake of foods with high amounts of sugar, saturated fats, and salt.  Take vitamin and mineral supplements as recommended by your health care provider.  Do  not drink alcohol if your health care provider tells you not to drink.  If you drink alcohol: ? Limit how much you have to 0-1 drink a day. ? Be aware of how much alcohol is in your drink. In the [place] one drink equals one 12 oz bottle of beer (355 mL), one 5 oz glass of wine (148 mL), or one 1 oz glass of hard liquor  (44 mL).   Lifestyle  Take daily care of your teeth and gums. Brush your teeth every morning and night with fluoride toothpaste. Floss one time each day.  Stay active. Exercise for at least 30 minutes 5 or more days each week.  Do not use any products that contain nicotine or tobacco, such as cigarettes, e-cigarettes, and chewing tobacco. If you need help quitting, ask your health care provider.  Do not use drugs.  If you are sexually active, practice safe sex. Use a condom or other form of protection in order to prevent STIs (sexually transmitted infections).  Talk with your health care provider about taking a low-dose aspirin or statin.  Find healthy ways to cope with stress, such as: ? Meditation, yoga, or listening to music. ? Journaling. ? Talking to a trusted person. ? Spending time with friends and family. Safety  Always wear your seat belt while driving or riding in a vehicle.  Do not drive: ? If you have been drinking alcohol. Do not ride with someone who has been drinking. ? When you are tired or distracted. ? While texting.  Wear a helmet and other protective equipment during sports activities.  If you have firearms in your house, make sure you follow all gun safety procedures. What's next?  Visit your health care provider once a year for an annual wellness visit.  Ask your health care provider how often you should have your eyes and teeth checked.  Stay up to date on all vaccines. This information is not intended to replace advice given to you by your health care provider. Make sure you discuss any questions you have with your health care provider. Document Revised: 10/08/2020 Document Reviewed: 10/12/2018 Elsevier Patient Education  2021 [employer].

## 2020-12-09 NOTE — Assessment & Plan Note (Signed)
Will proceed with pap at next visit

## 2020-12-11 ENCOUNTER — Other Ambulatory Visit: Payer: Self-pay | Admitting: *Deleted

## 2020-12-11 ENCOUNTER — Encounter: Payer: Self-pay | Admitting: Family Medicine

## 2020-12-11 DIAGNOSIS — G8929 Other chronic pain: Secondary | ICD-10-CM

## 2020-12-11 MED FILL — ROSUVASTATIN CALCIUM 40 MG: 40 | 90 days supply | Qty: 90 | Fill #1

## 2020-12-11 MED FILL — VITAMIN D3 25 MCG (1000 UT): 25 MCG | 100 days supply | Qty: 100 | Fill #1

## 2020-12-14 DIAGNOSIS — L729 Follicular cyst of the skin and subcutaneous tissue, unspecified: Secondary | ICD-10-CM | POA: Insufficient documentation

## 2020-12-14 NOTE — Assessment & Plan Note (Signed)
hgba1c acceptable, minimize simple carbs. Increase exercise as tolerated. Continue current meds 

## 2020-12-14 NOTE — Assessment & Plan Note (Signed)
Left shoulder pain is worsening. Xray confirms arthritis, if pain worsens she will need referral to sports medicine or orthopaedics. Encouraged moist heat and gentle stretching as tolerated. May try NSAIDs and/or topical treatments as directed and report if symptoms worsen or seek immediate care

## 2020-12-14 NOTE — Assessment & Plan Note (Signed)
Encouraged to get adequate exercise, calcium and vitamin d intake 

## 2020-12-14 NOTE — Assessment & Plan Note (Signed)
Arch of right foot, she has decided to have it removed before it enlarges or gets painful.

## 2020-12-14 NOTE — Assessment & Plan Note (Signed)
Patient encouraged to maintain heart healthy diet, regular exercise, adequate sleep. Consider daily probiotics. Take medications as prescribed. Labs ordered and reviewed. Repeat Bone density ordered today. Last colonoscopy 2017 due again in 2027. Last [facility] 3/21 repeat in next year.

## 2020-12-14 NOTE — Assessment & Plan Note (Signed)
Encouraged heart healthy diet, increase exercise, avoid trans fats, consider a krill oil cap daily. toleating Rosuvastatin

## 2020-12-14 NOTE — Progress Notes (Signed)
Subjective:    Patient ID: Priscilla Butler, female    DOB: Feb 10, 1951, 70 y.o.   MRN: [id number]  Chief Complaint  Patient presents with  . Annual Exam    HPI Patient is in today for annual preventative exam and follow up on chronic medical concerns. No recent febrile illness or hospitalizations. She is struggling with worsening left shoulder pain. No recent fall or injury but it has been bothering her for a couple of months. Pain is largely anterior. She is trying to eat well and stay active but has not been exercising as much as she has in the past. She has been under stress with some difficulties at home but is feeling some better about it presently. She has a cyst on her right foot she is considering having it removed. It is not painful or growing. Denies CP/palp/SOB/HA/congestion/fevers/GI or GU c/o. Taking meds as prescribed  Past Medical History:  Diagnosis Date  . Breast cancer ([place]) 01/2008   Stage I, right breast  . 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 DeQuervain's disease (tenosynovitis)   . DM (diabetes mellitus), type 2 ([place])   . H/O measles   . H/O mumps   . History of chicken pox   . Hx of TB skin testing   . Hyperglycemia   . Hyperlipidemia   . Menopause   . Osteopenia   . Otitis externa of left ear 01/30/2017  . Preventative health care 01/19/2016  . Welcome to Medicare preventive visit 01/27/2017    Past Surgical History:  Procedure Laterality Date  . BREAST LUMPECTOMY  03/2008   Right breast  . MOHS SURGERY  2013   BCC right upper lip, [place] skin surgery  . SKIN SURGERY     facial  skin cancer  . WISDOM TOOTH EXTRACTION  1972    Family History  Problem Relation Age of Onset  . Breast cancer Maternal Grandmother   . Cancer Maternal Grandmother 30       ish  . Cancer Maternal Grandfather        lung  . Hypertension Mother   . Hyperlipidemia Mother   . Arthritis Mother   . Other Paternal Grandmother     Social History    Socioeconomic History  . Marital status: Married    Spouse name: [name]  . Number of children: 1  . Years of education: BA  . Highest education level: Not on file  Occupational History  . Occupation: Retired    Fish farm manager: UNEMPLOYED  Tobacco Use  . Smoking status: Former Smoker    Quit date: 11/02/1999    Years since quitting: 21.1  . Smokeless tobacco: Never Used  Substance and Sexual Activity  . Alcohol use: Yes    Alcohol/week: 3.0 standard drinks    Types: 3 Cans of beer per week  . Drug use: No  . Sexual activity: Yes    Partners: Male    Birth control/protection: Post-menopausal    Comment: lives with husband, retired from Press photographer, Therapist, music, No dietary restrictions  Other Topics Concern  . Not on file  Social History Narrative  . Not on file   Social Determinants of Health   Financial Resource Strain: Not on file  Food Insecurity: Not on file  Transportation Needs: Not on file  Physical Activity: Not on file  Stress: Not on file  Social Connections: Not on file  Intimate Partner Violence: Not on file    Outpatient Medications Prior to Visit  Medication Sig Dispense Refill  . aspirin 81 MG EC tablet Take 1 tablet (81 mg total) by mouth daily. 90 tablet 3  . cholecalciferol (VITAMIN D) 25 MCG (1000 UNIT) tablet TAKE 1 TABLET BY MOUTH ONCE DAILY 100 tablet 1  . CRANBERRY PO Take 84 mg by mouth daily.    Marland Kitchen latanoprost (XALATAN) 0.005 % ophthalmic solution   99  . metFORMIN (GLUCOPHAGE) 500 MG tablet Take 1 tablet (500 mg total) by mouth 2 (two) times daily with a meal. 180 tablet 1  . Multiple Vitamin (MULTIVITAMIN) tablet Take 1 tablet by mouth daily.    . Omega-3 Fatty Acids (FISH OIL) 1000 MG CAPS Take 1 capsule by mouth 2 (two) times daily.    Glory Rosebush ULTRA test strip USE TO TEST BLOOD SUGAR TWICE A DAY AS DIRECTED 100 strip 1  . Probiotic Product (PROBIOTIC DAILY) CAPS Take 1 capsule by mouth daily. NOW company    . Psyllium (METAMUCIL SMOOTH  TEXTURE) 28.3 % POWD 1 tsp daily in water    . rosuvastatin (CRESTOR) 40 MG tablet Take 1 tablet (40 mg total) by mouth daily. 90 tablet 1  . SM CALCIUM CITRATE+VIT D3 MAX 315-250 MG-UNIT TABS TAKE 1 TABLET BY MOUTH TWICE DAILY 120 tablet 1  . timolol (TIMOPTIC) 0.5 % ophthalmic solution     . Turmeric 400 MG CAPS Take 450 mg by mouth 2 (two) times daily. 90 capsule 1  . Black Pepper-Turmeric (TURMERIC COMPLEX/BLACK PEPPER) 3-500 MG CAPS Take 1 capsule by mouth in the morning and at bedtime. 180 capsule 3  . glucosamine-chondroitin (MAX GLUCOSAMINE CHONDROITIN) 500-400 MG tablet Take 1 tablet by mouth 3 (three) times daily. 90 tablet 1  . neomycin-polymyxin-hydrocortisone (CORTISPORIN) 3.5-10000-1 OTIC suspension PLACE 5 DROPS INTO THE LEFT EAR 2 TIMES DAILY. 10 mL 0  . omega-3 acid ethyl esters (LOVAZA) 1 g capsule TAKE 1 CAPSULE BY MOUTH TWICE DAILY 60 capsule 3  . acetaminophen (TYLENOL) 500 MG tablet Take 1 tablet (500 mg total) by mouth 3 (three) times daily as needed. 90 tablet 1   No facility-administered medications prior to visit.    No Known Allergies  Review of Systems  Constitutional: Negative for fever and malaise/fatigue.  HENT: Negative for congestion.   Eyes: Negative for blurred vision.  Respiratory: Negative for shortness of breath.   Cardiovascular: Negative for chest pain, palpitations and leg swelling.  Gastrointestinal: Negative for abdominal pain, blood in stool and nausea.  Genitourinary: Negative for dysuria and frequency.  Musculoskeletal: Positive for joint pain. Negative for falls.  Skin: Negative for rash.  Neurological: Negative for dizziness, loss of consciousness and headaches.  Endo/Heme/Allergies: Negative for environmental allergies.  Psychiatric/Behavioral: Negative for depression. The patient is not nervous/anxious.        Objective:    Physical Exam  BP 132/84   Pulse 95   Temp 97.9 F (36.6 C)   Resp 16   Ht 5\' 2"  (1.575 m)   Wt 164 lb  3.2 oz (74.5 kg)   SpO2 97%   BMI 30.03 kg/m  Wt Readings from Last 3 Encounters:  12/09/20 164 lb 3.2 oz (74.5 kg)  08/15/19 165 lb (74.8 kg)  05/07/19 165 lb (74.8 kg)    Diabetic Foot Exam - Simple   Simple Foot Form Visual Inspection No deformities, no ulcerations, no other skin breakdown bilaterally: Yes Sensation Testing Intact to touch and monofilament testing bilaterally: Yes Pulse Check Posterior Tibialis and Dorsalis pulse intact bilaterally: Yes Comments  Lab Results  Component Value Date   WBC 6.1 11/28/2020   HGB 13.6 11/28/2020   HCT 40.9 11/28/2020   PLT 278.0 11/28/2020   GLUCOSE 162 (H) 11/28/2020   CHOL 154 11/28/2020   TRIG 131.0 11/28/2020   HDL 51.80 11/28/2020   LDLCALC 76 11/28/2020   ALT 27 11/28/2020   AST 22 11/28/2020   NA 139 11/28/2020   K 4.2 11/28/2020   CL 102 11/28/2020   CREATININE 0.79 11/28/2020   BUN 22 11/28/2020   CO2 27 11/28/2020   TSH 2.30 11/28/2020   HGBA1C 7.7 (H) 11/28/2020   MICROALBUR 2.6 01/27/2017    Lab Results  Component Value Date   TSH 2.30 11/28/2020   Lab Results  Component Value Date   WBC 6.1 11/28/2020   HGB 13.6 11/28/2020   HCT 40.9 11/28/2020   MCV 86.8 11/28/2020   PLT 278.0 11/28/2020   Lab Results  Component Value Date   NA 139 11/28/2020   K 4.2 11/28/2020   CO2 27 11/28/2020   GLUCOSE 162 (H) 11/28/2020   BUN 22 11/28/2020   CREATININE 0.79 11/28/2020   BILITOT 1.6 (H) 11/28/2020   ALKPHOS 63 11/28/2020   AST 22 11/28/2020   ALT 27 11/28/2020   PROT 7.0 11/28/2020   ALBUMIN 4.7 11/28/2020   CALCIUM 9.7 11/28/2020   GFR 76.25 11/28/2020   Lab Results  Component Value Date   CHOL 154 11/28/2020   Lab Results  Component Value Date   HDL 51.80 11/28/2020   Lab Results  Component Value Date   LDLCALC 76 11/28/2020   Lab Results  Component Value Date   TRIG 131.0 11/28/2020   Lab Results  Component Value Date   CHOLHDL 3 11/28/2020   Lab Results  Component  Value Date   HGBA1C 7.7 (H) 11/28/2020       Assessment & Plan:   Problem List Items Addressed This Visit    HX: breast cancer   Hyperlipidemia    Encouraged heart healthy diet, increase exercise, avoid trans fats, consider a krill oil cap daily. toleating Rosuvastatin      Relevant Medications   lisinopril (ZESTRIL) 5 MG tablet   Other Relevant Orders   Lipid panel   DM (diabetes mellitus), type 2 ([place])    hgba1c acceptable, minimize simple carbs. Increase exercise as tolerated. Continue current meds      Relevant Medications   lisinopril (ZESTRIL) 5 MG tablet   Other Relevant Orders   Hemoglobin A1c   CBC   Comprehensive metabolic panel   TSH   Osteopenia    Encouraged to get adequate exercise, calcium and vitamin d intake      Relevant Orders   DG Bone Density   Acromioclavicular joint arthritis    Left shoulder pain is worsening. Xray confirms arthritis, if pain worsens she will need referral to sports medicine or orthopaedics. Encouraged moist heat and gentle stretching as tolerated. May try NSAIDs and/or topical treatments as directed and report if symptoms worsen or seek immediate care      Elevated blood pressure reading   Relevant Orders   CBC   Comprehensive metabolic panel   TSH   Preventative health care    Patient encouraged to maintain heart healthy diet, regular exercise, adequate sleep. Consider daily probiotics. Take medications as prescribed. Labs ordered and reviewed. Repeat Bone density ordered today. Last colonoscopy 2017 due again in 2027. Last [facility] 3/21 repeat in next year.  Cervical cancer screening    Will proceed with pap at next visit      Cyst of subcutaneous tissue    Arch of right foot, she has decided to have it removed before it enlarges or gets painful.        Other Visit Diagnoses    Chronic left shoulder pain    -  Primary   Relevant Orders   DG Shoulder Left (Completed)   DG Bone Density   Post-menopause        Relevant Orders   DG Bone Density      I have discontinued Akeira R. Wroe's glucosamine-chondroitin, acetaminophen, omega-3 acid ethyl esters, neomycin-polymyxin-hydrocortisone, and Black Pepper-Turmeric. I am also having her start on lisinopril. Additionally, I am having her maintain her multivitamin, Fish Oil, CRANBERRY PO, latanoprost, Probiotic Daily, Psyllium, OneTouch Ultra, timolol, Turmeric, SM Calcium Citrate+Vit D3 Max, cholecalciferol, rosuvastatin, metFORMIN, and aspirin.  Meds ordered this encounter  Medications  . lisinopril (ZESTRIL) 5 MG tablet    Sig: Take 1 tablet (5 mg total) by mouth daily.    Dispense:  90 tablet    Refill:  1     Penni Homans, MD

## 2020-12-16 ENCOUNTER — Other Ambulatory Visit: Payer: Self-pay

## 2020-12-16 ENCOUNTER — Ambulatory Visit (INDEPENDENT_AMBULATORY_CARE_PROVIDER_SITE_OTHER): Payer: PPO | Admitting: Family Medicine

## 2020-12-16 ENCOUNTER — Ambulatory Visit: Payer: Self-pay

## 2020-12-16 VITALS — BP 122/80 | Ht 62.5 in | Wt 160.0 lb

## 2020-12-16 DIAGNOSIS — M25512 Pain in left shoulder: Secondary | ICD-10-CM

## 2020-12-16 NOTE — Patient Instructions (Signed)
Your shoulder pain is due to mild arthritis. These are the different medications you can take for this: Tylenol 500mg  1-2 tabs three times a day for pain. Capsaicin, aspercreme, or biofreeze topically up to four times a day may also help with pain. Some supplements that may help for arthritis: Boswellia extract, curcumin, pycnogenol Aleve 1-2 tabs twice a day with food if needed. Cortisone injections are an option if pain is severe. It's important that you continue to stay active. Rotator cuff strengthening is very important (strengthening muscles around the affected joint). Consider formal physical therapy. Heat or ice 15 minutes at a time 3-4 times a day as needed to help with pain. Follow up with me in 6 weeks.

## 2020-12-17 ENCOUNTER — Encounter: Payer: Self-pay | Admitting: Family Medicine

## 2020-12-17 NOTE — Progress Notes (Signed)
PCP: Mosie Lukes, MD  Subjective:   HPI: Patient is a 70 y.o. female here for left shoulder pain.  Patient reports she's had anterior left shoulder pain for several months. Feels uncomfortable at night especially when lying on left side. Affects her range of motion compared to the other side. Pain comes and goes, is not constant. No radiation down arm. No numbness. Has not tried anything for this to date. Right handed.  Past Medical History:  Diagnosis Date  . Breast cancer ([place]) 01/2008   Stage I, right breast  . 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 DeQuervain's disease (tenosynovitis)   . DM (diabetes mellitus), type 2 ([place])   . H/O measles   . H/O mumps   . History of chicken pox   . Hx of TB skin testing   . Hyperglycemia   . Hyperlipidemia   . Menopause   . Osteopenia   . Otitis externa of left ear 01/30/2017  . Preventative health care 01/19/2016  . Welcome to Medicare preventive visit 01/27/2017    Current Outpatient Medications on File Prior to Visit  Medication Sig Dispense Refill  . aspirin 81 MG EC tablet Take 1 tablet (81 mg total) by mouth daily. 90 tablet 3  . cholecalciferol (VITAMIN D) 25 MCG (1000 UNIT) tablet TAKE 1 TABLET BY MOUTH ONCE DAILY 100 tablet 1  . CRANBERRY PO Take 84 mg by mouth daily.    Marland Kitchen latanoprost (XALATAN) 0.005 % ophthalmic solution   99  . lisinopril (ZESTRIL) 5 MG tablet Take 1 tablet (5 mg total) by mouth daily. 90 tablet 1  . metFORMIN (GLUCOPHAGE) 500 MG tablet Take 1 tablet (500 mg total) by mouth 2 (two) times daily with a meal. 180 tablet 1  . Multiple Vitamin (MULTIVITAMIN) tablet Take 1 tablet by mouth daily.    . Omega-3 Fatty Acids (FISH OIL) 1000 MG CAPS Take 1 capsule by mouth 2 (two) times daily.    Glory Rosebush ULTRA test strip USE TO TEST BLOOD SUGAR TWICE A DAY AS DIRECTED 100 strip 1  . Probiotic Product (PROBIOTIC DAILY) CAPS Take 1 capsule by mouth daily. NOW company    . Psyllium (METAMUCIL  SMOOTH TEXTURE) 28.3 % POWD 1 tsp daily in water    . rosuvastatin (CRESTOR) 40 MG tablet Take 1 tablet (40 mg total) by mouth daily. 90 tablet 1  . SM CALCIUM CITRATE+VIT D3 MAX 315-250 MG-UNIT TABS TAKE 1 TABLET BY MOUTH TWICE DAILY 120 tablet 1  . timolol (TIMOPTIC) 0.5 % ophthalmic solution     . Turmeric 400 MG CAPS Take 450 mg by mouth 2 (two) times daily. 90 capsule 1   No current facility-administered medications on file prior to visit.    Past Surgical History:  Procedure Laterality Date  . BREAST LUMPECTOMY  03/2008   Right breast  . MOHS SURGERY  2013   BCC right upper lip, [place] skin surgery  . SKIN SURGERY     facial  skin cancer  . WISDOM TOOTH EXTRACTION  1972    No Known Allergies  Social History   Socioeconomic History  . Marital status: Married    Spouse name: [name]  . Number of children: 1  . Years of education: BA  . Highest education level: Not on file  Occupational History  . Occupation: Retired    Fish farm manager: UNEMPLOYED  Tobacco Use  . Smoking status: Former Smoker    Quit date: 11/02/1999    Years  since quitting: 21.1  . Smokeless tobacco: Never Used  Substance and Sexual Activity  . Alcohol use: Yes    Alcohol/week: 3.0 standard drinks    Types: 3 Cans of beer per week  . Drug use: No  . Sexual activity: Yes    Partners: Male    Birth control/protection: Post-menopausal    Comment: lives with husband, retired from Press photographer, Therapist, music, No dietary restrictions  Other Topics Concern  . Not on file  Social History Narrative  . Not on file   Social Determinants of Health   Financial Resource Strain: Not on file  Food Insecurity: Not on file  Transportation Needs: Not on file  Physical Activity: Not on file  Stress: Not on file  Social Connections: Not on file  Intimate Partner Violence: Not on file    Family History  Problem Relation Age of Onset  . Breast cancer Maternal Grandmother   . Cancer Maternal Grandmother 67        ish  . Cancer Maternal Grandfather        lung  . Hypertension Mother   . Hyperlipidemia Mother   . Arthritis Mother   . Other Paternal Grandmother     BP 122/80   Ht 5' 2.5" (1.588 m)   Wt 160 lb (72.6 kg)   BMI 28.80 kg/m   No flowsheet data found.  No flowsheet data found.  Review of Systems: See HPI above.     Objective:  Physical Exam:  Gen: NAD, comfortable in exam room  Left shoulder: No swelling, ecchymoses.  No gross deformity. Mild TTP anteriorly over biceps tendon, glenohumeral joint. FROM except lacks about 10 degrees full flexion and abduction. Negative Hawkins, Neers. Negative Yergasons, speeds. Strength 5/5 with empty can and resisted internal/external rotation. Negative apprehension. NV intact distally.  Limited MSK u/s left shoulder: Biceps tendon intact on long and trans views without tenosynovitis.  Mild spurring noted anterior and posterior glenohumeral joint.   Assessment & Plan:  1. Left shoulder pain - 2/2 mild glenohumeral arthritis.  Shown home exercises to do daily.  Tylenol, topical medications, supplements, nsaids reviewed.  Consider formal PT, injection if not improving.  F/u in 6 weeks.

## 2021-01-01 DIAGNOSIS — M8589 Other specified disorders of bone density and structure, multiple sites: Secondary | ICD-10-CM | POA: Diagnosis not present

## 2021-01-01 DIAGNOSIS — Z1231 Encounter for screening mammogram for malignant neoplasm of breast: Secondary | ICD-10-CM | POA: Diagnosis not present

## 2021-01-01 LAB — HM MAMMOGRAPHY

## 2021-01-01 LAB — HM DEXA SCAN

## 2021-01-14 ENCOUNTER — Other Ambulatory Visit (HOSPITAL_BASED_OUTPATIENT_CLINIC_OR_DEPARTMENT_OTHER): Payer: Self-pay | Admitting: Family Medicine

## 2021-01-19 ENCOUNTER — Other Ambulatory Visit: Payer: Self-pay | Admitting: Family Medicine

## 2021-01-19 ENCOUNTER — Encounter: Payer: Self-pay | Admitting: Family Medicine

## 2021-01-19 MED ORDER — LOSARTAN POTASSIUM 25 MG PO TABS: 25.0000 mg | ORAL_TABLET | Freq: Every day | ORAL | 2 refills | Status: DC

## 2021-01-19 MED FILL — LOSARTAN POTASSIUM 25 MG TA: 25 | 30 days supply | Qty: 30 | Fill #0

## 2021-01-26 ENCOUNTER — Ambulatory Visit (INDEPENDENT_AMBULATORY_CARE_PROVIDER_SITE_OTHER): Payer: PPO | Admitting: Family Medicine

## 2021-01-26 ENCOUNTER — Other Ambulatory Visit: Payer: Self-pay

## 2021-01-26 ENCOUNTER — Encounter: Payer: Self-pay | Admitting: Family Medicine

## 2021-01-26 VITALS — BP 150/90 | Ht 62.0 in | Wt 160.0 lb

## 2021-01-26 DIAGNOSIS — M25512 Pain in left shoulder: Secondary | ICD-10-CM | POA: Diagnosis not present

## 2021-01-26 MED FILL — TIMOLOL MALEATE 0.5 % SOLN: 0.5 | 75 days supply | Qty: 15 | Fill #0

## 2021-01-26 MED FILL — LATANOPROST 0.005% OPTH SOL: 0.005 | 75 days supply | Qty: 8 | Fill #0

## 2021-01-26 NOTE — Patient Instructions (Signed)
Do home exercises with theraband and scapular strengthening most days of the week. Call me if you want to do physical therapy. Otherwise follow up with me as needed.

## 2021-01-26 NOTE — Progress Notes (Signed)
PCP: Mosie Lukes, MD  Subjective:   HPI: Patient is a 70 y.o. female here for left shoulder pain.  2/15: Patient reports she's had anterior left shoulder pain for several months. Feels uncomfortable at night especially when lying on left side. Affects her range of motion compared to the other side. Pain comes and goes, is not constant. No radiation down arm. No numbness. Has not tried anything for this to date. Right handed.  3/28: Patient has had improved strength in left shoulder since last visit. Pain still comes and goes and is not constant. No real changes otherwise. Decreased motion on left.  Past Medical History:  Diagnosis Date  . Breast cancer ([place]) 01/2008   Stage I, right breast  . 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 DeQuervain's disease (tenosynovitis)   . DM (diabetes mellitus), type 2 ([place])   . H/O measles   . H/O mumps   . History of chicken pox   . Hx of TB skin testing   . Hyperglycemia   . Hyperlipidemia   . Menopause   . Osteopenia   . Otitis externa of left ear 01/30/2017  . Preventative health care 01/19/2016  . Welcome to Medicare preventive visit 01/27/2017    Current Outpatient Medications on File Prior to Visit  Medication Sig Dispense Refill  . aspirin 81 MG EC tablet Take 1 tablet (81 mg total) by mouth daily. 90 tablet 3  . cholecalciferol (VITAMIN D) 25 MCG (1000 UNIT) tablet TAKE 1 TABLET BY MOUTH ONCE DAILY 100 tablet 1  . CRANBERRY PO Take 84 mg by mouth daily.    Marland Kitchen latanoprost (XALATAN) 0.005 % ophthalmic solution   99  . losartan (COZAAR) 25 MG tablet Take 1 tablet (25 mg total) by mouth daily. 30 tablet 2  . metFORMIN (GLUCOPHAGE) 500 MG tablet Take 1 tablet (500 mg total) by mouth 2 (two) times daily with a meal. 180 tablet 1  . Multiple Vitamin (MULTIVITAMIN) tablet Take 1 tablet by mouth daily.    . Omega-3 Fatty Acids (FISH OIL) 1000 MG CAPS Take 1 capsule by mouth 2 (two) times daily.    Glory Rosebush  ULTRA test strip USE TO TEST BLOOD SUGAR TWICE A DAY AS DIRECTED 100 strip 1  . Probiotic Product (PROBIOTIC DAILY) CAPS Take 1 capsule by mouth daily. NOW company    . Psyllium (METAMUCIL SMOOTH TEXTURE) 28.3 % POWD 1 tsp daily in water    . rosuvastatin (CRESTOR) 40 MG tablet Take 1 tablet (40 mg total) by mouth daily. 90 tablet 1  . SM CALCIUM CITRATE+VIT D3 MAX 315-250 MG-UNIT TABS TAKE 1 TABLET BY MOUTH TWICE DAILY 120 tablet 1  . timolol (TIMOPTIC) 0.5 % ophthalmic solution     . Turmeric 400 MG CAPS Take 450 mg by mouth 2 (two) times daily. 90 capsule 1   No current facility-administered medications on file prior to visit.    Past Surgical History:  Procedure Laterality Date  . BREAST LUMPECTOMY  03/2008   Right breast  . MOHS SURGERY  2013   BCC right upper lip, [place] skin surgery  . SKIN SURGERY     facial  skin cancer  . WISDOM TOOTH EXTRACTION  1972    No Known Allergies  Social History   Socioeconomic History  . Marital status: Married    Spouse name: [name]  . Number of children: 1  . Years of education: BA  . Highest education level: Not on file  Occupational History  . Occupation: Retired    Fish farm manager: UNEMPLOYED  Tobacco Use  . Smoking status: Former Smoker    Quit date: 11/02/1999    Years since quitting: 21.2  . Smokeless tobacco: Never Used  Substance and Sexual Activity  . Alcohol use: Yes    Alcohol/week: 3.0 standard drinks    Types: 3 Cans of beer per week  . Drug use: No  . Sexual activity: Yes    Partners: Male    Birth control/protection: Post-menopausal    Comment: lives with husband, retired from Press photographer, Therapist, music, No dietary restrictions  Other Topics Concern  . Not on file  Social History Narrative  . Not on file   Social Determinants of Health   Financial Resource Strain: Not on file  Food Insecurity: Not on file  Transportation Needs: Not on file  Physical Activity: Not on file  Stress: Not on file  Social  Connections: Not on file  Intimate Partner Violence: Not on file    Family History  Problem Relation Age of Onset  . Breast cancer Maternal Grandmother   . Cancer Maternal Grandmother 53       ish  . Cancer Maternal Grandfather        lung  . Hypertension Mother   . Hyperlipidemia Mother   . Arthritis Mother   . Other Paternal Grandmother     BP (!) 150/90   Ht 5\' 2"  (1.575 m)   Wt 160 lb (72.6 kg)   BMI 29.26 kg/m   [facility] Adult Exercise 01/26/2021  Frequency of aerobic exercise (# of days/week) 5  Average time in minutes 45  Frequency of strengthening activities (# of days/week) 1    No flowsheet data found.  Review of Systems: See HPI above.     Objective:  Physical Exam:  Gen: NAD, comfortable in exam room  Left shoulder: No swelling, ecchymoses.  No gross deformity. No TTP. FROM except lacks 10 degrees flexion and abduction. Negative Hawkins, Neers. Negative Yergasons. Strength 5/5 with empty can and resisted internal/external rotation. Negative apprehension. NV intact distally.   Assessment & Plan:  1. Left shoulder pain - 2/2 mild glenohumeral arthritis.  Added some theraband strengthening and scapular strengthening exercises.  Tylenol, topical medications if needed.  Call us if she wants to pursue formal physical therapy otherwise f/u prn.

## 2021-02-02 ENCOUNTER — Other Ambulatory Visit (HOSPITAL_BASED_OUTPATIENT_CLINIC_OR_DEPARTMENT_OTHER): Payer: Self-pay

## 2021-02-23 ENCOUNTER — Other Ambulatory Visit (HOSPITAL_BASED_OUTPATIENT_CLINIC_OR_DEPARTMENT_OTHER): Payer: Self-pay

## 2021-02-23 ENCOUNTER — Encounter: Payer: Self-pay | Admitting: Family Medicine

## 2021-02-23 ENCOUNTER — Other Ambulatory Visit: Payer: Self-pay | Admitting: Family Medicine

## 2021-02-23 MED ORDER — CALCIUM CARBONATE-VITAMIN D3 600-400 MG-UNIT PO TABS
1.0000 | ORAL_TABLET | Freq: Two times a day (BID) | ORAL | 0 refills | Status: DC
  Filled 2021-02-23: qty 180, 90d supply, fill #0

## 2021-02-23 MED FILL — Losartan Potassium Tab 25 MG: ORAL | 30 days supply | Qty: 30 | Fill #0 | Status: AC

## 2021-02-23 MED FILL — Aspirin Tab Delayed Release 81 MG: ORAL | 120 days supply | Qty: 120 | Fill #0 | Status: AC

## 2021-02-24 ENCOUNTER — Other Ambulatory Visit (HOSPITAL_BASED_OUTPATIENT_CLINIC_OR_DEPARTMENT_OTHER): Payer: Self-pay

## 2021-03-16 ENCOUNTER — Other Ambulatory Visit: Payer: Self-pay

## 2021-03-16 ENCOUNTER — Other Ambulatory Visit (INDEPENDENT_AMBULATORY_CARE_PROVIDER_SITE_OTHER): Payer: PPO

## 2021-03-16 DIAGNOSIS — E782 Mixed hyperlipidemia: Secondary | ICD-10-CM

## 2021-03-16 DIAGNOSIS — E119 Type 2 diabetes mellitus without complications: Secondary | ICD-10-CM

## 2021-03-16 DIAGNOSIS — R030 Elevated blood-pressure reading, without diagnosis of hypertension: Secondary | ICD-10-CM

## 2021-03-16 LAB — COMPREHENSIVE METABOLIC PANEL WITH GFR
ALT: 23 U/L (ref 0–35)
AST: 19 U/L (ref 0–37)
Albumin: 4.6 g/dL (ref 3.5–5.2)
Alkaline Phosphatase: 64 U/L (ref 39–117)
BUN: 17 mg/dL (ref 6–23)
CO2: 28 meq/L (ref 19–32)
Calcium: 9.5 mg/dL (ref 8.4–10.5)
Chloride: 102 meq/L (ref 96–112)
Creatinine, Ser: 0.79 mg/dL (ref 0.40–1.20)
GFR: 76.09 mL/min
Glucose, Bld: 175 mg/dL — ABNORMAL HIGH (ref 70–99)
Potassium: 4.3 meq/L (ref 3.5–5.1)
Sodium: 139 meq/L (ref 135–145)
Total Bilirubin: 1.1 mg/dL (ref 0.2–1.2)
Total Protein: 6.8 g/dL (ref 6.0–8.3)

## 2021-03-16 LAB — LIPID PANEL
Cholesterol: 139 mg/dL (ref 0–200)
HDL: 50.2 mg/dL
LDL Cholesterol: 69 mg/dL (ref 0–99)
NonHDL: 88.49
Total CHOL/HDL Ratio: 3
Triglycerides: 97 mg/dL (ref 0.0–149.0)
VLDL: 19.4 mg/dL (ref 0.0–40.0)

## 2021-03-16 LAB — CBC
HCT: 39.9 % (ref 36.0–46.0)
Hemoglobin: 13.5 g/dL (ref 12.0–15.0)
MCHC: 33.9 g/dL (ref 30.0–36.0)
MCV: 86.3 fl (ref 78.0–100.0)
Platelets: 246 10*3/uL (ref 150.0–400.0)
RBC: 4.62 Mil/uL (ref 3.87–5.11)
RDW: 12.8 % (ref 11.5–15.5)
WBC: 5.9 10*3/uL (ref 4.0–10.5)

## 2021-03-16 LAB — TSH: TSH: 1.62 u[IU]/mL (ref 0.35–4.50)

## 2021-03-16 LAB — HEMOGLOBIN A1C: Hgb A1c MFr Bld: 7.8 % — ABNORMAL HIGH (ref 4.6–6.5)

## 2021-03-20 DIAGNOSIS — H401113 Primary open-angle glaucoma, right eye, severe stage: Secondary | ICD-10-CM | POA: Diagnosis not present

## 2021-03-20 DIAGNOSIS — H401122 Primary open-angle glaucoma, left eye, moderate stage: Secondary | ICD-10-CM | POA: Diagnosis not present

## 2021-03-20 DIAGNOSIS — H2513 Age-related nuclear cataract, bilateral: Secondary | ICD-10-CM | POA: Diagnosis not present

## 2021-03-20 DIAGNOSIS — H524 Presbyopia: Secondary | ICD-10-CM | POA: Diagnosis not present

## 2021-03-20 LAB — HM DIABETES EYE EXAM

## 2021-03-23 ENCOUNTER — Other Ambulatory Visit (HOSPITAL_COMMUNITY)
Admission: RE | Admit: 2021-03-23 | Discharge: 2021-03-23 | Disposition: A | Discharge status: Home / Self Care | Payer: PPO | Source: Ambulatory Visit | Attending: Family Medicine | Admitting: Family Medicine

## 2021-03-23 ENCOUNTER — Other Ambulatory Visit: Payer: Self-pay

## 2021-03-23 ENCOUNTER — Other Ambulatory Visit (HOSPITAL_BASED_OUTPATIENT_CLINIC_OR_DEPARTMENT_OTHER): Payer: Self-pay

## 2021-03-23 ENCOUNTER — Ambulatory Visit (INDEPENDENT_AMBULATORY_CARE_PROVIDER_SITE_OTHER): Payer: PPO | Admitting: Family Medicine

## 2021-03-23 ENCOUNTER — Other Ambulatory Visit: Payer: Self-pay | Admitting: Family Medicine

## 2021-03-23 ENCOUNTER — Encounter: Payer: Self-pay | Admitting: Family Medicine

## 2021-03-23 VITALS — BP 124/78 | HR 88 | Temp 98.0°F | Resp 16 | Wt 160.8 lb

## 2021-03-23 DIAGNOSIS — S61419A Laceration without foreign body of unspecified hand, initial encounter: Secondary | ICD-10-CM | POA: Diagnosis not present

## 2021-03-23 DIAGNOSIS — S51819A Laceration without foreign body of unspecified forearm, initial encounter: Secondary | ICD-10-CM | POA: Diagnosis not present

## 2021-03-23 DIAGNOSIS — E119 Type 2 diabetes mellitus without complications: Secondary | ICD-10-CM | POA: Diagnosis not present

## 2021-03-23 DIAGNOSIS — Z23 Encounter for immunization: Secondary | ICD-10-CM | POA: Diagnosis not present

## 2021-03-23 DIAGNOSIS — R030 Elevated blood-pressure reading, without diagnosis of hypertension: Secondary | ICD-10-CM | POA: Diagnosis not present

## 2021-03-23 DIAGNOSIS — Z124 Encounter for screening for malignant neoplasm of cervix: Secondary | ICD-10-CM | POA: Insufficient documentation

## 2021-03-23 DIAGNOSIS — M8580 Other specified disorders of bone density and structure, unspecified site: Secondary | ICD-10-CM

## 2021-03-23 DIAGNOSIS — E782 Mixed hyperlipidemia: Secondary | ICD-10-CM | POA: Diagnosis not present

## 2021-03-23 MED ORDER — ROSUVASTATIN CALCIUM 40 MG PO TABS
ORAL_TABLET | Freq: Every day | ORAL | 1 refills | Status: DC
  Filled 2021-03-23: qty 90, 90d supply, fill #0
  Filled 2021-06-16: qty 90, 90d supply, fill #1

## 2021-03-23 MED FILL — Losartan Potassium Tab 25 MG: ORAL | 30 days supply | Qty: 30 | Fill #1 | Status: AC

## 2021-03-23 MED FILL — Black Pepper-Turmeric Cap 3-500 MG: ORAL | 60 days supply | Qty: 120 | Fill #0 | Status: CN

## 2021-03-23 NOTE — Assessment & Plan Note (Signed)
Well controlled. Encouraged heart healthy diet such as the DASH diet and exercise as tolerated.  

## 2021-03-23 NOTE — Assessment & Plan Note (Signed)
She suffered a deep scratch over dorsal surface of her hand from her dog. She cleaned it well and there is not sign of infection. She is given a Tdap shot today

## 2021-03-23 NOTE — Assessment & Plan Note (Signed)
Encouraged heart healthy diet, increase exercise, avoid trans fats, consider a krill oil cap daily 

## 2021-03-23 NOTE — Assessment & Plan Note (Addendum)
hgba1c acceptable, minimize simple carbs. Increase exercise as tolerated. Continue current meds. Was surprised that her hgba1c was up as she has been eating better. She has started exercising but only 3-4 days a week. She is doing a great deal of traveling this summer so she chooses not to change medications for now. Will recheck labs in 3 months if worsens will have to adjust medications.

## 2021-03-23 NOTE — Patient Instructions (Signed)
High Cholesterol  High cholesterol is a condition in which the blood has high levels of a white, waxy substance similar to fat (cholesterol). The liver makes all the cholesterol that the body needs. The human body needs small amounts of cholesterol to help build cells. A person gets extra or excess cholesterol from the food that he or she eats. The blood carries cholesterol from the liver to the rest of the body. If you have high cholesterol, deposits (plaques) may build up on the walls of your arteries. Arteries are the blood vessels that carry blood away from your heart. These plaques make the arteries narrow and stiff. Cholesterol plaques increase your risk for heart attack and stroke. Work with your health care provider to keep your cholesterol levels in a healthy range. What increases the risk? The following factors may make you more likely to develop this condition:  Eating foods that are high in animal fat (saturated fat) or cholesterol.  Being overweight.  Not getting enough exercise.  A family history of high cholesterol (familial hypercholesterolemia).  Use of tobacco products.  Having diabetes. What are the signs or symptoms? There are no symptoms of this condition. How is this diagnosed? This condition may be diagnosed based on the results of a blood test.  If you are older than 70 years of age, your health care provider may check your cholesterol levels every 4-6 years.  You may be checked more often if you have high cholesterol or other risk factors for heart disease. The blood test for cholesterol measures:  "Bad" cholesterol, or LDL cholesterol. This is the main type of cholesterol that causes heart disease. The desired level is less than 100 mg/dL.  "Good" cholesterol, or HDL cholesterol. HDL helps protect against heart disease by cleaning the arteries and carrying the LDL to the liver for processing. The desired level for HDL is 60 mg/dL or higher.  Triglycerides.  These are fats that your body can store or burn for energy. The desired level is less than 150 mg/dL.  Total cholesterol. This measures the total amount of cholesterol in your blood and includes LDL, HDL, and triglycerides. The desired level is less than 200 mg/dL. How is this treated? This condition may be treated with:  Diet changes. You may be asked to eat foods that have more fiber and less saturated fats or added sugar.  Lifestyle changes. These may include regular exercise, maintaining a healthy weight, and quitting use of tobacco products.  Medicines. These are given when diet and lifestyle changes have not worked. You may be prescribed a statin medicine to help lower your cholesterol levels. Follow these instructions at home: Eating and drinking  Eat a healthy, balanced diet. This diet includes: ? Daily servings of a variety of fresh, frozen, or canned fruits and vegetables. ? Daily servings of whole grain foods that are rich in fiber. ? Foods that are low in saturated fats and trans fats. These include poultry and fish without skin, lean cuts of meat, and low-fat dairy products. ? A variety of fish, especially oily fish that contain omega-3 fatty acids. Aim to eat fish at least 2 times a week.  Avoid foods and drinks that have added sugar.  Use healthy cooking methods, such as roasting, grilling, broiling, baking, poaching, steaming, and stir-frying. Do not fry your food except for stir-frying.   Lifestyle  Get regular exercise. Aim to exercise for a total of 150 minutes a week. Increase your activity level by doing activities   such as gardening, walking, and taking the stairs.  Do not use any products that contain nicotine or tobacco, such as cigarettes, e-cigarettes, and chewing tobacco. If you need help quitting, ask your health care provider.   General instructions  Take over-the-counter and prescription medicines only as told by your health care provider.  Keep all  follow-up visits as told by your health care provider. This is important. Where to find more information  American Heart Association: www.heart.org  National Heart, Lung, and Blood Institute: www.nhlbi.nih.gov Contact a health care provider if:  You have trouble achieving or maintaining a healthy diet or weight.  You are starting an exercise program.  You are unable to stop smoking. Get help right away if:  You have chest pain.  You have trouble breathing.  You have any symptoms of a stroke. "BE FAST" is an easy way to remember the main warning signs of a stroke: ? B - Balance. Signs are dizziness, sudden trouble walking, or loss of balance. ? E - Eyes. Signs are trouble seeing or a sudden change in vision. ? F - Face. Signs are sudden weakness or numbness of the face, or the face or eyelid drooping on one side. ? A - Arms. Signs are weakness or numbness in an arm. This happens suddenly and usually on one side of the body. ? S - Speech. Signs are sudden trouble speaking, slurred speech, or trouble understanding what people say. ? T - Time. Time to call emergency services. Write down what time symptoms started.  You have other signs of a stroke, such as: ? A sudden, severe headache with no known cause. ? Nausea or vomiting. ? Seizure. These symptoms may represent a serious problem that is an emergency. Do not wait to see if the symptoms will go away. Get medical help right away. Call your local emergency services (911 in the [place] Do not drive yourself to the hospital. Summary  Cholesterol plaques increase your risk for heart attack and stroke. Work with your health care provider to keep your cholesterol levels in a healthy range.  Eat a healthy, balanced diet, get regular exercise, and maintain a healthy weight.  Do not use any products that contain nicotine or tobacco, such as cigarettes, e-cigarettes, and chewing tobacco.  Get help right away if you have any symptoms of a  stroke. This information is not intended to replace advice given to you by your health care provider. Make sure you discuss any questions you have with your health care provider. Document Revised: 09/17/2019 Document Reviewed: 09/17/2019 Elsevier Patient Education  2021 Elsevier Inc.  

## 2021-03-23 NOTE — Progress Notes (Signed)
Subjective:    Patient ID: Priscilla Butler, female    DOB: [birth year], 70 y.o.   MRN: [id number]  Chief Complaint  Patient presents with  . Follow-up    DM  . Gynecologic Exam    HPI Patient is in today for follow up on chronic medical concerns. No recent febrile illness or hospitalizations. Her dog scratched her left hand recently and it is slowly healing. No discharge or fluctuance. She is preparing for some traveling first to [place], then [place] and then to [place]. She is anxious to proceed. She tries to eat well and stay active. Denies CP/palp/SOB/HA/congestion/fevers/GI or GU c/o. Taking meds as prescribed  Past Medical History:  Diagnosis Date  . Breast cancer ([place]) 01/2008   Stage I, right breast  . 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 DeQuervain's disease (tenosynovitis)   . DM (diabetes mellitus), type 2 ([place])   . H/O measles   . H/O mumps   . History of chicken pox   . Hx of TB skin testing   . Hyperglycemia   . Hyperlipidemia   . Menopause   . Osteopenia   . Otitis externa of left ear 01/30/2017  . Preventative health care 01/19/2016  . Welcome to Medicare preventive visit 01/27/2017    Past Surgical History:  Procedure Laterality Date  . BREAST LUMPECTOMY  03/2008   Right breast  . MOHS SURGERY  2013   BCC right upper lip, [place] skin surgery  . SKIN SURGERY     facial  skin cancer  . WISDOM TOOTH EXTRACTION  1972    Family History  Problem Relation Age of Onset  . Breast cancer Maternal Grandmother   . Cancer Maternal Grandmother 55       ish  . Cancer Maternal Grandfather        lung  . Hypertension Mother   . Hyperlipidemia Mother   . Arthritis Mother   . Other Paternal Grandmother     Social History   Socioeconomic History  . Marital status: Married    Spouse name: [name]  . Number of children: 1  . Years of education: BA  . Highest education level: Not on file  Occupational History  . Occupation: Retired     Fish farm manager: UNEMPLOYED  Tobacco Use  . Smoking status: Former Smoker    Quit date: 11/02/1999    Years since quitting: 21.4  . Smokeless tobacco: Never Used  Substance and Sexual Activity  . Alcohol use: Yes    Alcohol/week: 3.0 standard drinks    Types: 3 Cans of beer per week  . Drug use: No  . Sexual activity: Yes    Partners: Male    Birth control/protection: Post-menopausal    Comment: lives with husband, retired from Press photographer, Therapist, music, No dietary restrictions  Other Topics Concern  . Not on file  Social History Narrative  . Not on file   Social Determinants of Health   Financial Resource Strain: Not on file  Food Insecurity: Not on file  Transportation Needs: Not on file  Physical Activity: Not on file  Stress: Not on file  Social Connections: Not on file  Intimate Partner Violence: Not on file    Outpatient Medications Prior to Visit  Medication Sig Dispense Refill  . aspirin 81 MG EC tablet TAKE 1 TABLET (81 MG TOTAL) BY MOUTH DAILY. 100 tablet 3  . Calcium Carbonate-Vitamin D3 600-400 MG-UNIT TABS TAKE 1 TABLET BY MOUTH TWICE DAILY 180 tablet  0  . cholecalciferol (VITAMIN D) 25 MCG (1000 UNIT) tablet TAKE 1 TABLET BY MOUTH ONCE DAILY 100 tablet 1  . COVID-19 mRNA vaccine, Moderna, 100 MCG/0.5ML injection INJECT AS DIRECTED .25 mL 0  . CRANBERRY PO Take 84 mg by mouth daily.    Marland Kitchen losartan (COZAAR) 25 MG tablet TAKE 1 TABLET BY MOUTH ONCE DAILY 30 tablet 2  . metFORMIN (GLUCOPHAGE) 500 MG tablet TAKE 1 TABLET (500 MG TOTAL) BY MOUTH 2 (TWO) TIMES DAILY WITH A MEAL. 180 tablet 1  . Multiple Vitamin (MULTIVITAMIN) tablet Take 1 tablet by mouth daily.    . Omega-3 Fatty Acids (FISH OIL) 1000 MG CAPS Take 1 capsule by mouth 2 (two) times daily.    Glory Rosebush ULTRA test strip USE TO TEST BLOOD SUGAR TWICE A DAY AS DIRECTED 100 strip 1  . Probiotic Product (PROBIOTIC DAILY) CAPS Take 1 capsule by mouth daily. NOW company    . Psyllium (METAMUCIL SMOOTH TEXTURE) 28.3  % POWD 1 tsp daily in water    . rosuvastatin (CRESTOR) 40 MG tablet TAKE 1 TABLET BY MOUTH ONCE DAILY 90 tablet 1  . SM CALCIUM CITRATE+VIT D3 MAX 315-250 MG-UNIT TABS TAKE 1 TABLET BY MOUTH TWICE DAILY 120 tablet 1  . Turmeric 400 MG CAPS Take 450 mg by mouth 2 (two) times daily. 90 capsule 1  . Black Pepper-Turmeric 3-500 MG CAPS TAKE 1 CAPSULE BY MOUTH IN THE MORNING AND AT BEDTIME. 180 capsule 3  . latanoprost (XALATAN) 0.005 % ophthalmic solution   99  . latanoprost (XALATAN) 0.005 % ophthalmic solution PLACE 1 DROP INTO BOTH EYES EACH NIGHT AT BEDTIME 7.5 mL 4  . latanoprost (XALATAN) 0.005 % ophthalmic solution PLACE 1 DROP INTO BOTH EYES EACH NIGHT AT BEDTIME 7.5 mL 1  . timolol (TIMOPTIC) 0.5 % ophthalmic solution     . timolol (TIMOPTIC) 0.5 % ophthalmic solution PLACE 1 DROP IN BOTH EYES 2 TIMES A DAY 15 mL 4  . timolol (TIMOPTIC) 0.5 % ophthalmic solution PLACE 1 DROP IN BOTH EYES 2 TIMES A DAY 15 mL 3   No facility-administered medications prior to visit.    No Known Allergies  Review of Systems  Constitutional: Negative for fever and malaise/fatigue.  HENT: Negative for congestion.   Eyes: Negative for blurred vision.  Respiratory: Negative for shortness of breath.   Cardiovascular: Negative for chest pain, palpitations and leg swelling.  Gastrointestinal: Negative for abdominal pain, blood in stool and nausea.  Genitourinary: Negative for dysuria and frequency.  Musculoskeletal: Negative for falls.  Skin: Negative for rash.  Neurological: Negative for dizziness, loss of consciousness and headaches.  Endo/Heme/Allergies: Negative for environmental allergies.  Psychiatric/Behavioral: Negative for depression. The patient is not nervous/anxious.        Objective:    Physical Exam Vitals and nursing note reviewed.  Constitutional:      General: She is not in acute distress.    Appearance: She is well-developed.  HENT:     Head: Normocephalic and atraumatic.      Nose: Nose normal.  Eyes:     General:        Right eye: No discharge.        Left eye: No discharge.  Cardiovascular:     Rate and Rhythm: Normal rate and regular rhythm.     Heart sounds: No murmur heard.   Pulmonary:     Effort: Pulmonary effort is normal.     Breath sounds: Normal breath sounds.  Abdominal:     General: Bowel sounds are normal.     Palpations: Abdomen is soft.     Tenderness: There is no abdominal tenderness.  Musculoskeletal:     Cervical back: Normal range of motion and neck supple.  Skin:    General: Skin is warm and dry.  Neurological:     Mental Status: She is alert and oriented to person, place, and time.     BP 124/78   Pulse 88   Temp 98 F (36.7 C)   Resp 16   Wt 160 lb 12.8 oz (72.9 kg)   SpO2 98%   BMI 29.41 kg/m  Wt Readings from Last 3 Encounters:  03/23/21 160 lb 12.8 oz (72.9 kg)  01/26/21 160 lb (72.6 kg)  12/16/20 160 lb (72.6 kg)    Diabetic Foot Exam - Simple   No data filed    Lab Results  Component Value Date   WBC 5.9 03/16/2021   HGB 13.5 03/16/2021   HCT 39.9 03/16/2021   PLT 246.0 03/16/2021   GLUCOSE 175 (H) 03/16/2021   CHOL 139 03/16/2021   TRIG 97.0 03/16/2021   HDL 50.20 03/16/2021   LDLCALC 69 03/16/2021   ALT 23 03/16/2021   AST 19 03/16/2021   NA 139 03/16/2021   K 4.3 03/16/2021   CL 102 03/16/2021   CREATININE 0.79 03/16/2021   BUN 17 03/16/2021   CO2 28 03/16/2021   TSH 1.62 03/16/2021   HGBA1C 7.8 (H) 03/16/2021   MICROALBUR 2.6 01/27/2017    Lab Results  Component Value Date   TSH 1.62 03/16/2021   Lab Results  Component Value Date   WBC 5.9 03/16/2021   HGB 13.5 03/16/2021   HCT 39.9 03/16/2021   MCV 86.3 03/16/2021   PLT 246.0 03/16/2021   Lab Results  Component Value Date   NA 139 03/16/2021   K 4.3 03/16/2021   CO2 28 03/16/2021   GLUCOSE 175 (H) 03/16/2021   BUN 17 03/16/2021   CREATININE 0.79 03/16/2021   BILITOT 1.1 03/16/2021   ALKPHOS 64 03/16/2021   AST 19  03/16/2021   ALT 23 03/16/2021   PROT 6.8 03/16/2021   ALBUMIN 4.6 03/16/2021   CALCIUM 9.5 03/16/2021   GFR 76.09 03/16/2021   Lab Results  Component Value Date   CHOL 139 03/16/2021   Lab Results  Component Value Date   HDL 50.20 03/16/2021   Lab Results  Component Value Date   LDLCALC 69 03/16/2021   Lab Results  Component Value Date   TRIG 97.0 03/16/2021   Lab Results  Component Value Date   CHOLHDL 3 03/16/2021   Lab Results  Component Value Date   HGBA1C 7.8 (H) 03/16/2021       Assessment & Plan:   Problem List Items Addressed This Visit    Hyperlipidemia - Primary    Encouraged heart healthy diet, increase exercise, avoid trans fats, consider a krill oil cap daily      Relevant Orders   Lipid panel   DM (diabetes mellitus), type 2 ([place])    hgba1c acceptable, minimize simple carbs. Increase exercise as tolerated. Continue current meds. Was surprised that her hgba1c was up as she has been eating better. She has started exercising but only 3-4 days a week. She is doing a great deal of traveling this summer so she chooses not to change medications for now. Will recheck labs in 3 months if worsens will have to adjust medications.  Relevant Orders   Hemoglobin A1c   Comprehensive metabolic panel   Osteopenia   Relevant Orders   CBC   Elevated blood pressure reading    Well controlled. Encouraged heart healthy diet such as the DASH diet and exercise as tolerated.       Relevant Orders   CBC   TSH   Cervical cancer screening   Relevant Orders   Cytology - PAP( [place])   Laceration of hand    She suffered a deep scratch over dorsal surface of her hand from her dog. She cleaned it well and there is not sign of infection. She is given a Tdap shot today       Other Visit Diagnoses    Need for Tdap vaccination       Relevant Orders   Tdap vaccine greater than or equal to 7yo IM      I have discontinued Omie R. Pensinger's latanoprost,  timolol, timolol, latanoprost, Black Pepper-Turmeric, latanoprost, and timolol. I am also having her maintain her multivitamin, Fish Oil, CRANBERRY PO, Probiotic Daily, Psyllium, OneTouch Ultra, Turmeric, SM Calcium Citrate+Vit D3 Max, losartan, aspirin, metFORMIN, cholecalciferol, COVID-19 mRNA vaccine (Moderna), Calcium Carbonate-Vitamin D3, and rosuvastatin.  No orders of the defined types were placed in this encounter.    Penni Homans, MD

## 2021-03-24 ENCOUNTER — Other Ambulatory Visit: Payer: Self-pay | Admitting: Family Medicine

## 2021-03-24 ENCOUNTER — Other Ambulatory Visit (HOSPITAL_BASED_OUTPATIENT_CLINIC_OR_DEPARTMENT_OTHER): Payer: Self-pay

## 2021-03-24 MED ORDER — BLACK PEPPER-TURMERIC 3-500 MG PO CAPS
ORAL_CAPSULE | ORAL | 1 refills | Status: DC
  Filled 2021-03-24: qty 240, 120d supply, fill #0

## 2021-03-25 LAB — CYTOLOGY - PAP: Diagnosis: NEGATIVE

## 2021-04-03 ENCOUNTER — Other Ambulatory Visit (HOSPITAL_BASED_OUTPATIENT_CLINIC_OR_DEPARTMENT_OTHER): Payer: Self-pay

## 2021-04-03 ENCOUNTER — Other Ambulatory Visit: Payer: Self-pay | Admitting: Family Medicine

## 2021-04-03 MED ORDER — VITAMIN D3 25 MCG (1000 UNIT) PO TABS
ORAL_TABLET | Freq: Every day | ORAL | 1 refills | Status: DC
  Filled 2021-04-03: qty 100, 100d supply, fill #0
  Filled 2021-07-17: qty 100, 100d supply, fill #1

## 2021-04-20 ENCOUNTER — Other Ambulatory Visit (HOSPITAL_BASED_OUTPATIENT_CLINIC_OR_DEPARTMENT_OTHER): Payer: Self-pay

## 2021-04-20 ENCOUNTER — Other Ambulatory Visit: Payer: Self-pay

## 2021-04-20 ENCOUNTER — Other Ambulatory Visit: Payer: Self-pay | Admitting: Family Medicine

## 2021-04-20 DIAGNOSIS — D485 Neoplasm of uncertain behavior of skin: Secondary | ICD-10-CM | POA: Diagnosis not present

## 2021-04-20 DIAGNOSIS — Z85828 Personal history of other malignant neoplasm of skin: Secondary | ICD-10-CM | POA: Diagnosis not present

## 2021-04-20 DIAGNOSIS — D2239 Melanocytic nevi of other parts of face: Secondary | ICD-10-CM | POA: Diagnosis not present

## 2021-04-20 DIAGNOSIS — L905 Scar conditions and fibrosis of skin: Secondary | ICD-10-CM | POA: Diagnosis not present

## 2021-04-20 MED ORDER — LATANOPROST 0.005 % OP SOLN
1.0000 [drp] | Freq: Every day | OPHTHALMIC | 4 refills | Status: DC
  Filled 2021-04-20: qty 10, 90d supply, fill #0
  Filled 2021-07-17: qty 7.5, 75d supply, fill #1
  Filled 2021-10-13: qty 7.5, 75d supply, fill #2
  Filled 2021-12-14: qty 7.5, 75d supply, fill #3

## 2021-04-20 MED ORDER — LOSARTAN POTASSIUM 25 MG PO TABS
25.0000 mg | ORAL_TABLET | Freq: Every day | ORAL | 1 refills | Status: DC
  Filled 2021-04-20: qty 90, 90d supply, fill #0
  Filled 2021-07-17: qty 90, 90d supply, fill #1

## 2021-04-20 MED ORDER — METFORMIN HCL 500 MG PO TABS
500.0000 mg | ORAL_TABLET | Freq: Two times a day (BID) | ORAL | 1 refills | Status: DC
  Filled 2021-04-20: qty 180, 90d supply, fill #0
  Filled 2021-07-17: qty 180, 90d supply, fill #1

## 2021-04-20 MED ORDER — TIMOLOL MALEATE 0.5 % OP SOLN
OPHTHALMIC | 4 refills | Status: DC
  Filled 2021-04-20: qty 20, 90d supply, fill #0
  Filled 2021-07-17: qty 15, 75d supply, fill #1
  Filled 2021-10-12: qty 15, 75d supply, fill #2
  Filled 2022-01-25: qty 15, 75d supply, fill #3

## 2021-04-21 ENCOUNTER — Other Ambulatory Visit (HOSPITAL_BASED_OUTPATIENT_CLINIC_OR_DEPARTMENT_OTHER): Payer: Self-pay

## 2021-04-27 ENCOUNTER — Ambulatory Visit: Payer: PPO | Attending: Internal Medicine

## 2021-04-27 ENCOUNTER — Other Ambulatory Visit (HOSPITAL_BASED_OUTPATIENT_CLINIC_OR_DEPARTMENT_OTHER): Payer: Self-pay

## 2021-04-27 DIAGNOSIS — Z23 Encounter for immunization: Secondary | ICD-10-CM

## 2021-04-27 MED ORDER — COVID-19 MRNA VAC-TRIS(PFIZER) 30 MCG/0.3ML IM SUSP
INTRAMUSCULAR | 0 refills | Status: DC
  Filled 2021-04-27: qty 0.3, 1d supply, fill #0

## 2021-04-27 NOTE — Progress Notes (Signed)
   Covid-19 Vaccination Clinic  Name:  Priscilla Butler    MRN: [id number] DOB: [birth year]  04/27/2021  Ms. Milhouse was observed post Covid-19 immunization for 15 minutes without incident. She was provided with Vaccine Information Sheet and instruction to access the V-Safe system.   Ms. Curl was instructed to call 911 with any severe reactions post vaccine: Difficulty breathing  Swelling of face and throat  A fast heartbeat  A bad rash all over body  Dizziness and weakness   Immunizations Administered     Name Date Dose VIS Date Route   PFIZER Comrnaty(Gray TOP) Covid-19 Vaccine 04/27/2021 12:17 PM 0.3 mL 10/09/2020 Intramuscular   Manufacturer: [place]   Lot: [id number]   Linda: [phone]

## 2021-06-16 ENCOUNTER — Other Ambulatory Visit (HOSPITAL_BASED_OUTPATIENT_CLINIC_OR_DEPARTMENT_OTHER): Payer: Self-pay

## 2021-06-21 ENCOUNTER — Other Ambulatory Visit (HOSPITAL_BASED_OUTPATIENT_CLINIC_OR_DEPARTMENT_OTHER): Payer: Self-pay

## 2021-07-03 ENCOUNTER — Other Ambulatory Visit (HOSPITAL_BASED_OUTPATIENT_CLINIC_OR_DEPARTMENT_OTHER): Payer: Self-pay

## 2021-07-13 ENCOUNTER — Other Ambulatory Visit: Payer: Self-pay

## 2021-07-13 ENCOUNTER — Other Ambulatory Visit (INDEPENDENT_AMBULATORY_CARE_PROVIDER_SITE_OTHER): Payer: PPO

## 2021-07-13 DIAGNOSIS — E119 Type 2 diabetes mellitus without complications: Secondary | ICD-10-CM | POA: Diagnosis not present

## 2021-07-13 DIAGNOSIS — R030 Elevated blood-pressure reading, without diagnosis of hypertension: Secondary | ICD-10-CM

## 2021-07-13 DIAGNOSIS — E782 Mixed hyperlipidemia: Secondary | ICD-10-CM | POA: Diagnosis not present

## 2021-07-13 DIAGNOSIS — M8580 Other specified disorders of bone density and structure, unspecified site: Secondary | ICD-10-CM

## 2021-07-13 LAB — COMPREHENSIVE METABOLIC PANEL
ALT: 23 U/L (ref 0–35)
AST: 19 U/L (ref 0–37)
Albumin: 4.3 g/dL (ref 3.5–5.2)
Alkaline Phosphatase: 59 U/L (ref 39–117)
BUN: 15 mg/dL (ref 6–23)
CO2: 29 mEq/L (ref 19–32)
Calcium: 9.5 mg/dL (ref 8.4–10.5)
Chloride: 103 mEq/L (ref 96–112)
Creatinine, Ser: 0.75 mg/dL (ref 0.40–1.20)
GFR: 80.8 mL/min (ref >60.00)
Glucose, Bld: 140 mg/dL — ABNORMAL HIGH (ref 70–99)
Potassium: 4.3 mEq/L (ref 3.5–5.1)
Sodium: 139 mEq/L (ref 135–145)
Total Bilirubin: 1 mg/dL (ref 0.2–1.2)
Total Protein: 6.6 g/dL (ref 6.0–8.3)

## 2021-07-13 LAB — LIPID PANEL
Cholesterol: 141 mg/dL (ref 0–200)
HDL: 54.8 mg/dL (ref >39.00)
LDL Cholesterol: 63 mg/dL (ref 0–99)
NonHDL: 86.34
Total CHOL/HDL Ratio: 3
Triglycerides: 117 mg/dL (ref 0.0–149.0)
VLDL: 23.4 mg/dL (ref 0.0–40.0)

## 2021-07-13 LAB — CBC
HCT: 40.3 % (ref 36.0–46.0)
Hemoglobin: 13.3 g/dL (ref 12.0–15.0)
MCHC: 33 g/dL (ref 30.0–36.0)
MCV: 88.1 fl (ref 78.0–100.0)
Platelets: 258 10*3/uL (ref 150.0–400.0)
RBC: 4.57 Mil/uL (ref 3.87–5.11)
RDW: 13 % (ref 11.5–15.5)
WBC: 5.9 10*3/uL (ref 4.0–10.5)

## 2021-07-13 LAB — TSH: TSH: 2.09 u[IU]/mL (ref 0.35–5.50)

## 2021-07-13 LAB — HEMOGLOBIN A1C: Hgb A1c MFr Bld: 7.7 % — ABNORMAL HIGH (ref 4.6–6.5)

## 2021-07-17 ENCOUNTER — Other Ambulatory Visit (HOSPITAL_BASED_OUTPATIENT_CLINIC_OR_DEPARTMENT_OTHER): Payer: Self-pay

## 2021-07-17 MED FILL — Aspirin Tab Delayed Release 81 MG: ORAL | 120 days supply | Qty: 120 | Fill #1 | Status: AC

## 2021-07-20 ENCOUNTER — Other Ambulatory Visit (HOSPITAL_BASED_OUTPATIENT_CLINIC_OR_DEPARTMENT_OTHER): Payer: Self-pay

## 2021-07-22 DIAGNOSIS — H401122 Primary open-angle glaucoma, left eye, moderate stage: Secondary | ICD-10-CM | POA: Diagnosis not present

## 2021-07-22 DIAGNOSIS — H401113 Primary open-angle glaucoma, right eye, severe stage: Secondary | ICD-10-CM | POA: Diagnosis not present

## 2021-08-04 ENCOUNTER — Other Ambulatory Visit (HOSPITAL_BASED_OUTPATIENT_CLINIC_OR_DEPARTMENT_OTHER): Payer: Self-pay

## 2021-08-04 ENCOUNTER — Other Ambulatory Visit: Payer: Self-pay | Admitting: Family Medicine

## 2021-08-04 MED ORDER — INFLUENZA VAC A&B SA ADJ QUAD 0.5 ML IM PRSY
PREFILLED_SYRINGE | INTRAMUSCULAR | 0 refills | Status: DC
  Filled 2021-08-04: qty 0.5, 1d supply, fill #0

## 2021-08-05 ENCOUNTER — Other Ambulatory Visit (HOSPITAL_BASED_OUTPATIENT_CLINIC_OR_DEPARTMENT_OTHER): Payer: Self-pay

## 2021-08-12 ENCOUNTER — Other Ambulatory Visit: Payer: Self-pay | Admitting: Family Medicine

## 2021-08-12 ENCOUNTER — Other Ambulatory Visit (HOSPITAL_BASED_OUTPATIENT_CLINIC_OR_DEPARTMENT_OTHER): Payer: Self-pay

## 2021-08-12 MED ORDER — BLACK PEPPER-TURMERIC 3-500 MG PO CAPS
ORAL_CAPSULE | ORAL | 1 refills | Status: DC
  Filled 2021-08-12: qty 120, 60d supply, fill #0
  Filled 2021-10-12: qty 120, 60d supply, fill #1

## 2021-08-12 MED ORDER — CALCIUM CARBONATE-VITAMIN D3 600-400 MG-UNIT PO TABS
1.0000 | ORAL_TABLET | Freq: Two times a day (BID) | ORAL | 1 refills | Status: DC
  Filled 2021-08-12: qty 180, 90d supply, fill #0

## 2021-08-12 MED ORDER — VITAMIN D3 25 MCG (1000 UNIT) PO TABS
ORAL_TABLET | Freq: Every day | ORAL | 1 refills | Status: DC
Start: 2021-08-12 — End: 2021-12-21
  Filled 2021-08-12: qty 100, 100d supply, fill #0
  Filled 2021-09-14: qty 100, 100d supply, fill #1

## 2021-08-13 ENCOUNTER — Other Ambulatory Visit (HOSPITAL_BASED_OUTPATIENT_CLINIC_OR_DEPARTMENT_OTHER): Payer: Self-pay

## 2021-08-14 ENCOUNTER — Other Ambulatory Visit (HOSPITAL_BASED_OUTPATIENT_CLINIC_OR_DEPARTMENT_OTHER): Payer: Self-pay

## 2021-08-25 ENCOUNTER — Other Ambulatory Visit (HOSPITAL_BASED_OUTPATIENT_CLINIC_OR_DEPARTMENT_OTHER): Payer: Self-pay

## 2021-08-31 ENCOUNTER — Encounter: Payer: Self-pay | Admitting: Family Medicine

## 2021-09-14 ENCOUNTER — Other Ambulatory Visit (HOSPITAL_BASED_OUTPATIENT_CLINIC_OR_DEPARTMENT_OTHER): Payer: Self-pay

## 2021-09-14 ENCOUNTER — Other Ambulatory Visit: Payer: Self-pay | Admitting: Family Medicine

## 2021-09-14 MED ORDER — FLUOCINOLONE ACETONIDE 0.01 % OT OIL
TOPICAL_OIL | OTIC | 1 refills | Status: DC
  Filled 2021-09-14: qty 20, 90d supply, fill #0
  Filled 2022-09-07: qty 20, 90d supply, fill #1

## 2021-09-14 MED ORDER — ROSUVASTATIN CALCIUM 40 MG PO TABS
ORAL_TABLET | Freq: Every day | ORAL | 1 refills | Status: DC
  Filled 2021-09-14: qty 90, 90d supply, fill #0
  Filled 2021-12-21: qty 90, 90d supply, fill #1

## 2021-09-23 ENCOUNTER — Other Ambulatory Visit (HOSPITAL_BASED_OUTPATIENT_CLINIC_OR_DEPARTMENT_OTHER): Payer: Self-pay

## 2021-10-05 ENCOUNTER — Other Ambulatory Visit (HOSPITAL_BASED_OUTPATIENT_CLINIC_OR_DEPARTMENT_OTHER): Payer: Self-pay

## 2021-10-12 ENCOUNTER — Other Ambulatory Visit (HOSPITAL_BASED_OUTPATIENT_CLINIC_OR_DEPARTMENT_OTHER): Payer: Self-pay

## 2021-10-12 ENCOUNTER — Other Ambulatory Visit: Payer: Self-pay | Admitting: Family Medicine

## 2021-10-13 ENCOUNTER — Other Ambulatory Visit (HOSPITAL_BASED_OUTPATIENT_CLINIC_OR_DEPARTMENT_OTHER): Payer: Self-pay

## 2021-10-13 MED ORDER — METFORMIN HCL 500 MG PO TABS
500.0000 mg | ORAL_TABLET | Freq: Two times a day (BID) | ORAL | 1 refills | Status: DC
  Filled 2021-10-13: qty 180, 90d supply, fill #0
  Filled 2022-01-04: qty 180, 90d supply, fill #1

## 2021-10-13 MED ORDER — LOSARTAN POTASSIUM 25 MG PO TABS
25.0000 mg | ORAL_TABLET | Freq: Every day | ORAL | 1 refills | Status: DC
  Filled 2021-10-13: qty 90, 90d supply, fill #0
  Filled 2022-01-04: qty 90, 90d supply, fill #1

## 2021-10-29 ENCOUNTER — Encounter: Payer: Self-pay | Admitting: Family Medicine

## 2021-10-29 ENCOUNTER — Telehealth (HOSPITAL_BASED_OUTPATIENT_CLINIC_OR_DEPARTMENT_OTHER): Payer: Self-pay | Admitting: Pharmacist

## 2021-10-29 ENCOUNTER — Other Ambulatory Visit (HOSPITAL_BASED_OUTPATIENT_CLINIC_OR_DEPARTMENT_OTHER): Payer: Self-pay

## 2021-10-29 MED ORDER — PAXLOVID (300/100) 20 X 150 MG & 10 X 100MG PO TBPK
ORAL_TABLET | ORAL | 0 refills | Status: DC
Start: 2021-10-29 — End: 2022-02-18
  Filled 2021-10-29: qty 30, 5d supply, fill #0

## 2021-10-29 NOTE — Telephone Encounter (Signed)
Outpatient Pharmacy Oral COVID Treatment Note  I connected with Priscilla Butler on 10/29/2021/11:03 AM by telephone and verified that I am speaking with the correct person using two identifiers.  I discussed the limitations, risks, security, and privacy concerns of performing an evaluation and management service by telephone and the availability of in person appointments via referral to a physician. The patient expressed understanding and agreed to proceed.  Pharmacy location: [place] at [facility]  Diagnosis: COVID-19 infection  Purpose of visit: Discussion of potential use of Paxlovid, a new treatment for mild to moderate COVID-19 viral infection in non-hospitalized patients.  Subjective/Objective: Patient is a 70 y.o. female who is presenting with COVID 19 viral infection.  COVID 19 viral infection. Their symptoms began yesterday, December 29th with cough.  The patient has confirmed COVID-19 via a home test yesterday.    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   DeQuervain's disease (tenosynovitis)    DM (diabetes mellitus), type 2 ([place])    H/O measles    H/O mumps    History of chicken pox    Hx of TB skin testing    Hyperglycemia    Hyperlipidemia    Menopause    Osteopenia    Otitis externa of left ear 01/30/2017   Preventative health care 01/19/2016   Welcome to Medicare preventive visit 01/27/2017    No Known Allergies   Current Outpatient Medications:    aspirin 81 MG EC tablet, TAKE 1 TABLET (81 MG TOTAL) BY MOUTH DAILY., Disp: 100 tablet, Rfl: 3   Black Pepper-Turmeric (TURMERIC COMPLEX/BLACK PEPPER) 3-500 MG CAPS, TAKE 1 CAPSULE BY MOUTH 2 (TWO) TIMES DAILY., Disp: 120 capsule, Rfl: 1   Calcium Carbonate-Vitamin D3 600-400 MG-UNIT TABS, Take 1 tablet by mouth 2 (two) times daily., Disp: 180 tablet, Rfl: 1   cholecalciferol  (VITAMIN D) 25 MCG (1000 UNIT) tablet, TAKE 1 TABLET BY MOUTH ONCE DAILY, Disp: 100 tablet, Rfl: 1   COVID-19 mRNA Vac-TriS, Pfizer, SUSP injection, Inject into the muscle., Disp: 0.3 mL, Rfl: 0   CRANBERRY PO, Take 84 mg by mouth daily., Disp: , Rfl:    Fluocinolone Acetonide 0.01 % OIL, Place 1 drop into the ear as directed 2 times daily as needed for itching, Disp: 20 mL, Rfl: 1   influenza vaccine adjuvanted (FLUAD) 0.5 ML injection, Inject into the muscle., Disp: 0.5 mL, Rfl: 0   latanoprost (XALATAN) 0.005 % ophthalmic solution, PLACE 1 DROP INTO BOTH EYES AT BEDTIME, Disp: 10 mL, Rfl: 4   losartan (COZAAR) 25 MG tablet, Take 1 tablet (25 mg total) by mouth daily., Disp: 90 tablet, Rfl: 1   metFORMIN (GLUCOPHAGE) 500 MG tablet, Take 1 tablet (500 mg total) by mouth 2 (two) times daily with a meal., Disp: 180 tablet, Rfl: 1   Multiple Vitamin (MULTIVITAMIN) tablet, Take 1 tablet by mouth daily., Disp: , Rfl:    nirmatrelvir & ritonavir (PAXLOVID, 300/100,) 20 x 150 MG & 10 x 100MG TBPK, Take 3 tablets by mouth twice daily for 5 days. Stop rosuvastatin while on paxlovid., Disp: 30 tablet, Rfl: 0   Omega-3 Fatty Acids (FISH OIL) 1000 MG CAPS, Take 1 capsule by mouth 2 (two) times daily., Disp: , Rfl:    ONETOUCH ULTRA test strip, USE TO TEST BLOOD SUGAR TWICE A DAY AS DIRECTED, Disp: 100 strip, Rfl: 1  Probiotic Product (PROBIOTIC DAILY) CAPS, Take 1 capsule by mouth daily. NOW company, Disp: , Rfl:    Psyllium (METAMUCIL SMOOTH TEXTURE) 28.3 % POWD, 1 tsp daily in water, Disp: , Rfl:    rosuvastatin (CRESTOR) 40 MG tablet, TAKE 1 TABLET BY MOUTH ONCE DAILY, Disp: 90 tablet, Rfl: 1   SM CALCIUM CITRATE+VIT D3 MAX 315-250 MG-UNIT TABS, TAKE 1 TABLET BY MOUTH TWICE DAILY, Disp: 120 tablet, Rfl: 1   timolol (TIMOPTIC) 0.5 % ophthalmic solution, PLACE 1 DROP IN BOTH EYES 2 TIMES A DAY, Disp: 20 mL, Rfl: 4   Turmeric 400 MG CAPS, Take 450 mg by mouth 2 (two) times daily., Disp: 90 capsule, Rfl:  1  Lab Monitoring: eGFR 80  Drug Interactions Noted: Patient will hold rosuvastatin while on paxlovid.  Plan:  This patient is a 70 y.o. female that meets the criteria for Emergency Use Authorization of Paxlovid. After reviewing the emergency use authorization with the patient, the patient agrees to receive Paxlovid.  Through FDA guidance and current [place] standing order Paxlovid will be prescribed to the patient.   Patient contacted for counseling on Dec. 29th and verbalized understanding.   Delivery or Pick-Up Date: December 29th, 2022  Follow up instructions:    Take prescription BID x 5 days as directed Counseling was provided by pharmacist. Reach out to pharmacist with follow up questions For concerns regarding further COVID symptoms please follow up with your PCP or urgent care For urgent or life-threatening issues, seek care at your local emergency department   [facility] 10/29/2021, 11:03 AM [place] Pharmacist Phone# [phone]

## 2021-10-29 NOTE — Telephone Encounter (Signed)
Pt was seen

## 2021-11-03 ENCOUNTER — Encounter: Payer: PPO | Admitting: Family Medicine

## 2021-12-01 ENCOUNTER — Other Ambulatory Visit: Payer: Self-pay | Admitting: Family Medicine

## 2021-12-01 ENCOUNTER — Other Ambulatory Visit (HOSPITAL_BASED_OUTPATIENT_CLINIC_OR_DEPARTMENT_OTHER): Payer: Self-pay

## 2021-12-01 MED ORDER — ONETOUCH ULTRA VI STRP
ORAL_STRIP | 12 refills | Status: DC
  Filled 2021-12-01: qty 100, 50d supply, fill #0
  Filled 2022-09-07: qty 100, 50d supply, fill #1

## 2021-12-02 ENCOUNTER — Other Ambulatory Visit (HOSPITAL_COMMUNITY): Payer: Self-pay

## 2021-12-02 ENCOUNTER — Other Ambulatory Visit (HOSPITAL_BASED_OUTPATIENT_CLINIC_OR_DEPARTMENT_OTHER): Payer: Self-pay

## 2021-12-02 DIAGNOSIS — H401122 Primary open-angle glaucoma, left eye, moderate stage: Secondary | ICD-10-CM | POA: Diagnosis not present

## 2021-12-02 DIAGNOSIS — H401113 Primary open-angle glaucoma, right eye, severe stage: Secondary | ICD-10-CM | POA: Diagnosis not present

## 2021-12-02 MED ORDER — DORZOLAMIDE HCL-TIMOLOL MAL 2-0.5 % OP SOLN
1.0000 [drp] | Freq: Two times a day (BID) | OPHTHALMIC | 12 refills | Status: DC
  Filled 2021-12-02 (×2): qty 10, 50d supply, fill #0
  Filled 2022-01-26: qty 10, 90d supply, fill #1
  Filled 2022-01-26: qty 10, 50d supply, fill #1
  Filled 2022-02-26 – 2022-03-02 (×2): qty 10, 50d supply, fill #2
  Filled 2022-04-19: qty 10, 50d supply, fill #3
  Filled 2022-06-08: qty 10, 50d supply, fill #4
  Filled 2022-07-23: qty 10, 50d supply, fill #5
  Filled 2022-09-13: qty 10, 50d supply, fill #6
  Filled 2022-10-19: qty 10, 50d supply, fill #7

## 2021-12-14 ENCOUNTER — Other Ambulatory Visit (HOSPITAL_BASED_OUTPATIENT_CLINIC_OR_DEPARTMENT_OTHER): Payer: Self-pay

## 2021-12-21 ENCOUNTER — Other Ambulatory Visit: Payer: Self-pay | Admitting: Family Medicine

## 2021-12-21 ENCOUNTER — Other Ambulatory Visit (HOSPITAL_BASED_OUTPATIENT_CLINIC_OR_DEPARTMENT_OTHER): Payer: Self-pay

## 2021-12-21 MED ORDER — BLACK PEPPER-TURMERIC 3-500 MG PO CAPS
ORAL_CAPSULE | ORAL | 1 refills | Status: DC
  Filled 2021-12-21: qty 120, 60d supply, fill #0

## 2021-12-21 MED ORDER — VITAMIN D3 25 MCG (1000 UNIT) PO TABS
ORAL_TABLET | Freq: Every day | ORAL | 1 refills | Status: DC
  Filled 2021-12-21: qty 200, 200d supply, fill #0

## 2021-12-22 ENCOUNTER — Other Ambulatory Visit (HOSPITAL_BASED_OUTPATIENT_CLINIC_OR_DEPARTMENT_OTHER): Payer: Self-pay

## 2022-01-04 ENCOUNTER — Other Ambulatory Visit (HOSPITAL_BASED_OUTPATIENT_CLINIC_OR_DEPARTMENT_OTHER): Payer: Self-pay

## 2022-01-04 ENCOUNTER — Other Ambulatory Visit: Payer: Self-pay | Admitting: Family Medicine

## 2022-01-04 MED ORDER — ASPIRIN 81 MG PO TBEC
DELAYED_RELEASE_TABLET | Freq: Every day | ORAL | 3 refills | Status: DC
  Filled 2022-01-04: qty 120, 120d supply, fill #0
  Filled 2022-06-07: qty 120, 120d supply, fill #1
  Filled 2022-09-07: qty 120, 120d supply, fill #2

## 2022-01-06 DIAGNOSIS — H401122 Primary open-angle glaucoma, left eye, moderate stage: Secondary | ICD-10-CM | POA: Diagnosis not present

## 2022-01-06 DIAGNOSIS — H401113 Primary open-angle glaucoma, right eye, severe stage: Secondary | ICD-10-CM | POA: Diagnosis not present

## 2022-01-11 DIAGNOSIS — Z1231 Encounter for screening mammogram for malignant neoplasm of breast: Secondary | ICD-10-CM | POA: Diagnosis not present

## 2022-01-22 ENCOUNTER — Other Ambulatory Visit (HOSPITAL_BASED_OUTPATIENT_CLINIC_OR_DEPARTMENT_OTHER): Payer: Self-pay

## 2022-01-22 ENCOUNTER — Ambulatory Visit: Payer: PPO | Attending: Internal Medicine

## 2022-01-22 DIAGNOSIS — Z23 Encounter for immunization: Secondary | ICD-10-CM

## 2022-01-25 ENCOUNTER — Other Ambulatory Visit (HOSPITAL_BASED_OUTPATIENT_CLINIC_OR_DEPARTMENT_OTHER): Payer: Self-pay

## 2022-01-26 ENCOUNTER — Other Ambulatory Visit (HOSPITAL_BASED_OUTPATIENT_CLINIC_OR_DEPARTMENT_OTHER): Payer: Self-pay

## 2022-01-27 NOTE — Progress Notes (Signed)
? ?  Covid-19 Vaccination Clinic ? ?Name:  Priscilla Butler    ?MRN: [id number] ?DOB: 1951-02-19 ? ?01/27/2022 ? ?Ms. Priscilla Butler was observed post Covid-19 immunization for 15 minutes without incident. She was provided with Vaccine Information Sheet and instruction to access the V-Safe system.  ? ?Ms. Priscilla Butler was instructed to call 911 with any severe reactions post vaccine: ?Difficulty breathing  ?Swelling of face and throat  ?A fast heartbeat  ?A bad rash all over body  ?Dizziness and weakness  ? ?Immunizations Administered   ? ? Name Date Dose VIS Date Route  ? Ambulance person Booster 01/22/2022  1:09 PM 0.3 mL 07/01/2021 Intramuscular  ? Manufacturer: [place]: [phone]  ? [place]: [phone]  ? ?  ? ? ?

## 2022-01-28 ENCOUNTER — Other Ambulatory Visit (HOSPITAL_BASED_OUTPATIENT_CLINIC_OR_DEPARTMENT_OTHER): Payer: Self-pay

## 2022-01-28 MED ORDER — PFIZER COVID-19 VAC BIVALENT 30 MCG/0.3ML IM SUSP
INTRAMUSCULAR | 0 refills | Status: DC
  Filled 2022-01-28: qty 0.3, 1d supply, fill #0

## 2022-02-15 ENCOUNTER — Encounter: Payer: Self-pay | Admitting: Family Medicine

## 2022-02-15 DIAGNOSIS — E782 Mixed hyperlipidemia: Secondary | ICD-10-CM

## 2022-02-15 DIAGNOSIS — Z0000 Encounter for general adult medical examination without abnormal findings: Secondary | ICD-10-CM

## 2022-02-15 DIAGNOSIS — E119 Type 2 diabetes mellitus without complications: Secondary | ICD-10-CM

## 2022-02-15 DIAGNOSIS — R030 Elevated blood-pressure reading, without diagnosis of hypertension: Secondary | ICD-10-CM

## 2022-02-16 ENCOUNTER — Other Ambulatory Visit (INDEPENDENT_AMBULATORY_CARE_PROVIDER_SITE_OTHER): Payer: PPO

## 2022-02-16 DIAGNOSIS — E119 Type 2 diabetes mellitus without complications: Secondary | ICD-10-CM

## 2022-02-16 DIAGNOSIS — Z0000 Encounter for general adult medical examination without abnormal findings: Secondary | ICD-10-CM | POA: Diagnosis not present

## 2022-02-16 DIAGNOSIS — R030 Elevated blood-pressure reading, without diagnosis of hypertension: Secondary | ICD-10-CM | POA: Diagnosis not present

## 2022-02-16 DIAGNOSIS — E782 Mixed hyperlipidemia: Secondary | ICD-10-CM | POA: Diagnosis not present

## 2022-02-16 LAB — COMPREHENSIVE METABOLIC PANEL
ALT: 19 U/L (ref 0–35)
AST: 16 U/L (ref 0–37)
Albumin: 4.6 g/dL (ref 3.5–5.2)
Alkaline Phosphatase: 55 U/L (ref 39–117)
BUN: 19 mg/dL (ref 6–23)
CO2: 29 mEq/L (ref 19–32)
Calcium: 9.4 mg/dL (ref 8.4–10.5)
Chloride: 104 mEq/L (ref 96–112)
Creatinine, Ser: 0.85 mg/dL (ref 0.40–1.20)
GFR: 69.24 mL/min (ref >60.00)
Glucose, Bld: 154 mg/dL — ABNORMAL HIGH (ref 70–99)
Potassium: 4.2 mEq/L (ref 3.5–5.1)
Sodium: 139 mEq/L (ref 135–145)
Total Bilirubin: 1.1 mg/dL (ref 0.2–1.2)
Total Protein: 6.7 g/dL (ref 6.0–8.3)

## 2022-02-16 LAB — CBC
HCT: 39 % (ref 36.0–46.0)
Hemoglobin: 13 g/dL (ref 12.0–15.0)
MCHC: 33.3 g/dL (ref 30.0–36.0)
MCV: 88.1 fl (ref 78.0–100.0)
Platelets: 245 10*3/uL (ref 150.0–400.0)
RBC: 4.42 Mil/uL (ref 3.87–5.11)
RDW: 13 % (ref 11.5–15.5)
WBC: 5.8 10*3/uL (ref 4.0–10.5)

## 2022-02-16 LAB — LIPID PANEL
Cholesterol: 144 mg/dL (ref 0–200)
HDL: 57.7 mg/dL (ref >39.00)
LDL Cholesterol: 65 mg/dL (ref 0–99)
NonHDL: 86.07
Total CHOL/HDL Ratio: 2
Triglycerides: 103 mg/dL (ref 0.0–149.0)
VLDL: 20.6 mg/dL (ref 0.0–40.0)

## 2022-02-16 LAB — HEMOGLOBIN A1C: Hgb A1c MFr Bld: 7.7 % — ABNORMAL HIGH (ref 4.6–6.5)

## 2022-02-16 LAB — TSH: TSH: 2.17 u[IU]/mL (ref 0.35–5.50)

## 2022-02-17 ENCOUNTER — Other Ambulatory Visit: Payer: PPO

## 2022-02-17 NOTE — Progress Notes (Signed)
? ?Subjective:  ? ? Patient ID: Priscilla Butler, female    DOB: 05-05-1951, 71 y.o.   MRN: [id number] ? ?Chief Complaint  ?Patient presents with  ? Annual Exam  ? ? ?HPI ?Patient is in today for her annual physical exam and follow up on chronic medical concerns. No recent febrile illness or hospitalizations. She reports she was worried her blood work readings were going to be worse than they were as she has not been exercising as much as usual or eating quite as well. Fortunately her A1C and other labs were stable and she feels motivated to start exercising more again. She is doing well overall. No complaints of polyuria or polydipsia. Denies CP/palp/SOB/HA/congestion/fevers/GI or GU c/o. Taking meds as prescribed  ? ?Past Medical History:  ?Diagnosis Date  ? Breast cancer ([place]) 01/2008  ? Stage I, right breast  ? 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 DeQuervain's disease (tenosynovitis)   ? DM (diabetes mellitus), type 2 ([place])   ? H/O measles   ? H/O mumps   ? History of chicken pox   ? Hx of TB skin testing   ? Hyperglycemia   ? Hyperlipidemia   ? Menopause   ? Osteopenia   ? Otitis externa of left ear 01/30/2017  ? Preventative health care 01/19/2016  ? Welcome to Medicare preventive visit 01/27/2017  ? ? ?Past Surgical History:  ?Procedure Laterality Date  ? BREAST LUMPECTOMY  03/2008  ? Right breast  ? MOHS SURGERY  2013  ? BCC right upper lip, [place] skin surgery  ? SKIN SURGERY    ? facial  skin cancer  ? [place]  ? ? ?Family History  ?Problem Relation Age of Onset  ? Breast cancer Maternal Grandmother   ? Cancer Maternal Grandmother 27  ?     ish  ? Cancer Maternal Grandfather   ?     lung  ? Hypertension Mother   ? Hyperlipidemia Mother   ? Arthritis Mother   ? Other Paternal Grandmother   ? ? ?Social History  ? ?Socioeconomic History  ? Marital status: Married  ?  Spouse name: [name]  ? Number of children: 1  ? Years of education: BA  ? Highest education level: Not on  file  ?Occupational History  ? Occupation: Retired  ?  Employer: UNEMPLOYED  ?Tobacco Use  ? Smoking status: Former  ?  Types: Cigarettes  ?  Quit date: 11/02/1999  ?  Years since quitting: 22.3  ? Smokeless tobacco: Never  ?Substance and Sexual Activity  ? Alcohol use: Yes  ?  Alcohol/week: 3.0 standard drinks  ?  Types: 3 Cans of beer per week  ? Drug use: No  ? Sexual activity: Yes  ?  Partners: Male  ?  Birth control/protection: Post-menopausal  ?  Comment: lives with husband, retired from Press photographer, Therapist, music, No dietary restrictions  ?Other Topics Concern  ? Not on file  ?Social History Narrative  ? Not on file  ? ?Social Determinants of Health  ? ?Financial Resource Strain: Not on file  ?Food Insecurity: Not on file  ?Transportation Needs: Not on file  ?Physical Activity: Not on file  ?Stress: Not on file  ?Social Connections: Not on file  ?Intimate Partner Violence: Not on file  ? ? ?Outpatient Medications Prior to Visit  ?Medication Sig Dispense Refill  ? aspirin 81 MG EC tablet TAKE 1 TABLET (81 MG TOTAL) BY MOUTH DAILY. 120 tablet  3  ? Calcium Carbonate-Vitamin D3 600-400 MG-UNIT TABS Take 1 tablet by mouth 2 (two) times daily. 180 tablet 1  ? cholecalciferol (VITAMIN D) 25 MCG (1000 UNIT) tablet TAKE 1 TABLET BY MOUTH ONCE DAILY 100 tablet 1  ? CRANBERRY PO Take 84 mg by mouth daily.    ? dorzolamide-timolol (COSOPT) 22.3-6.8 MG/ML ophthalmic solution Place 1 drop into both eyes 2 (two) times daily. 10 mL 12  ? Fluocinolone Acetonide 0.01 % OIL Place 1 drop into the ear as directed 2 times daily as needed for itching 20 mL 1  ? glucose blood (ONETOUCH ULTRA) test strip USE TO TEST BLOOD SUGAR TWICE A DAY AS DIRECTED 100 strip 12  ? influenza vaccine adjuvanted (FLUAD) 0.5 ML injection Inject into the muscle. 0.5 mL 0  ? latanoprost (XALATAN) 0.005 % ophthalmic solution PLACE 1 DROP INTO BOTH EYES AT BEDTIME 10 mL 4  ? losartan (COZAAR) 25 MG tablet Take 1 tablet (25 mg total) by mouth daily. 90  tablet 1  ? metFORMIN (GLUCOPHAGE) 500 MG tablet Take 1 tablet (500 mg total) by mouth 2 (two) times daily with a meal. 180 tablet 1  ? Multiple Vitamin (MULTIVITAMIN) tablet Take 1 tablet by mouth daily.    ? Omega-3 Fatty Acids (FISH OIL) 1000 MG CAPS Take 1 capsule by mouth 2 (two) times daily.    ? Probiotic Product (PROBIOTIC DAILY) CAPS Take 1 capsule by mouth daily. NOW company    ? Psyllium (METAMUCIL SMOOTH TEXTURE) 28.3 % POWD 1 tsp daily in water    ? rosuvastatin (CRESTOR) 40 MG tablet TAKE 1 TABLET BY MOUTH ONCE DAILY 90 tablet 1  ? Turmeric 400 MG CAPS Take 450 mg by mouth 2 (two) times daily. 90 capsule 1  ? COVID-19 mRNA bivalent vaccine, Pfizer, (PFIZER COVID-19 VAC BIVALENT) injection Inject into the muscle. 0.3 mL 0  ? COVID-19 mRNA Vac-TriS, Pfizer, SUSP injection Inject into the muscle. 0.3 mL 0  ? Black Pepper-Turmeric (TURMERIC COMPLEX/BLACK PEPPER) 3-500 MG CAPS TAKE 1 CAPSULE BY MOUTH 2 (TWO) TIMES DAILY. 120 capsule 1  ? lisinopril (ZESTRIL) 5 MG tablet Take 1 tablet (5 mg total) by mouth daily. 90 tablet 1  ? nirmatrelvir & ritonavir (PAXLOVID, 300/100,) 20 x 150 MG & 10 x '100MG'$  TBPK Take 3 tablets by mouth twice daily for 5 days. Stop rosuvastatin while on paxlovid. 30 tablet 0  ? omega-3 acid ethyl esters (LOVAZA) 1 g capsule TAKE 1 CAPSULE BY MOUTH TWICE DAILY 60 capsule 3  ? SM CALCIUM CITRATE+VIT D3 MAX 315-250 MG-UNIT TABS TAKE 1 TABLET BY MOUTH TWICE DAILY 120 tablet 1  ? timolol (TIMOPTIC) 0.5 % ophthalmic solution PLACE 1 DROP IN BOTH EYES 2 TIMES A DAY 20 mL 4  ? ?No facility-administered medications prior to visit.  ? ? ?No Known Allergies ? ?Review of Systems  ?Constitutional:  Negative for chills, fever and malaise/fatigue.  ?HENT:  Negative for congestion and hearing loss.   ?Eyes:  Negative for discharge.  ?Respiratory:  Negative for cough, sputum production and shortness of breath.   ?Cardiovascular:  Negative for chest pain, palpitations and leg swelling.   ?Gastrointestinal:  Negative for abdominal pain, blood in stool, constipation, diarrhea, heartburn, nausea and vomiting.  ?Genitourinary:  Negative for dysuria, frequency, hematuria and urgency.  ?Musculoskeletal:  Negative for back pain, falls and myalgias.  ?Skin:  Negative for rash.  ?Neurological:  Negative for dizziness, sensory change, loss of consciousness, weakness and headaches.  ?Endo/Heme/Allergies:  Negative for environmental allergies. Does not bruise/bleed easily.  ?Psychiatric/Behavioral:  Negative for depression and suicidal ideas. The patient is not nervous/anxious and does not have insomnia.   ? ?   ?Objective:  ?  ?Physical Exam ?Constitutional:   ?   General: She is not in acute distress. ?   Appearance: She is well-developed.  ?HENT:  ?   Head: Normocephalic and atraumatic.  ?Eyes:  ?   Conjunctiva/sclera: Conjunctivae normal.  ?Neck:  ?   Thyroid: No thyromegaly.  ?Cardiovascular:  ?   Rate and Rhythm: Normal rate and regular rhythm.  ?   Heart sounds: Normal heart sounds. No murmur heard. ?Pulmonary:  ?   Effort: Pulmonary effort is normal. No respiratory distress.  ?   Breath sounds: Normal breath sounds.  ?Abdominal:  ?   General: Bowel sounds are normal. There is no distension.  ?   Palpations: Abdomen is soft. There is no mass.  ?   Tenderness: There is no abdominal tenderness.  ?Musculoskeletal:  ?   Cervical back: Neck supple.  ?Lymphadenopathy:  ?   Cervical: No cervical adenopathy.  ?Skin: ?   General: Skin is warm and dry.  ?Neurological:  ?   Mental Status: She is alert and oriented to person, place, and time.  ?Psychiatric:     ?   Behavior: Behavior normal.  ? ? ?BP 124/82 (BP Location: Left Arm, Patient Position: Sitting, Cuff Size: Normal)   Pulse 76   Resp 20   Ht '5\' 2"'$  (1.575 m)   Wt 161 lb 9.6 oz (73.3 kg)   SpO2 97%   BMI 29.56 kg/m?  ?Wt Readings from Last 3 Encounters:  ?02/18/22 161 lb 9.6 oz (73.3 kg)  ?03/23/21 160 lb 12.8 oz (72.9 kg)  ?01/26/21 160 lb (72.6 kg)   ? ? ?Diabetic Foot Exam - Simple   ?No data filed ?  ? ?Lab Results  ?Component Value Date  ? WBC 5.8 02/16/2022  ? HGB 13.0 02/16/2022  ? HCT 39.0 02/16/2022  ? PLT 245.0 02/16/2022  ? GLUCOSE 154 (H) 02/16/2022  ? CHOL 144 04/1

## 2022-02-18 ENCOUNTER — Other Ambulatory Visit (HOSPITAL_BASED_OUTPATIENT_CLINIC_OR_DEPARTMENT_OTHER): Payer: Self-pay

## 2022-02-18 ENCOUNTER — Ambulatory Visit (INDEPENDENT_AMBULATORY_CARE_PROVIDER_SITE_OTHER): Payer: PPO | Admitting: Family Medicine

## 2022-02-18 ENCOUNTER — Encounter: Payer: Self-pay | Admitting: Family Medicine

## 2022-02-18 VITALS — BP 124/82 | HR 76 | Resp 20 | Ht 62.0 in | Wt 161.6 lb

## 2022-02-18 DIAGNOSIS — E782 Mixed hyperlipidemia: Secondary | ICD-10-CM | POA: Diagnosis not present

## 2022-02-18 DIAGNOSIS — M8580 Other specified disorders of bone density and structure, unspecified site: Secondary | ICD-10-CM | POA: Diagnosis not present

## 2022-02-18 DIAGNOSIS — E119 Type 2 diabetes mellitus without complications: Secondary | ICD-10-CM

## 2022-02-18 DIAGNOSIS — Z0000 Encounter for general adult medical examination without abnormal findings: Secondary | ICD-10-CM | POA: Diagnosis not present

## 2022-02-18 DIAGNOSIS — R030 Elevated blood-pressure reading, without diagnosis of hypertension: Secondary | ICD-10-CM

## 2022-02-18 MED ORDER — AMLODIPINE BESYLATE 5 MG PO TABS
5.0000 mg | ORAL_TABLET | Freq: Every day | ORAL | 3 refills | Status: DC
  Filled 2022-02-18: qty 30, 30d supply, fill #0
  Filled 2022-03-16: qty 30, 30d supply, fill #1
  Filled 2022-04-06 – 2022-04-08 (×2): qty 30, 30d supply, fill #2
  Filled 2022-04-30 – 2022-05-03 (×3): qty 30, 30d supply, fill #3

## 2022-02-18 NOTE — Assessment & Plan Note (Signed)
hgba1c acceptable, minimize simple carbs. Increase exercise as tolerated. Continue current meds 

## 2022-02-18 NOTE — Patient Instructions (Addendum)
Blood pressure 100-140/60-90 is the goal ?Pulse 60-90 ? ?Bone density shows osteopenia, which is thinner than normal but not as bad as osteoporosis. Recommend calcium intake of 1200 to 1500 mg daily, divided into roughly 3 doses. Best source is the diet and a single dairy serving is about 500 mg, a supplement of calcium citrate once or twice daily to balance diet is fine if not getting enough in diet. Also need Vitamin D 2000 IU caps, 1 cap daily if not already taking vitamin D. Also recommend weight baring exercise on hips and upper body to keep bones strong  ? ?Preventive Care 36 Years and Older, Female ?Preventive care refers to lifestyle choices and visits with your health care provider that can promote health and wellness. Preventive care visits are also called wellness exams. ?What can I expect for my preventive care visit? ?Counseling ?Your health care provider may ask you questions about your: ?Medical history, including: ?Past medical problems. ?Family medical history. ?Pregnancy and menstrual history. ?History of falls. ?Current health, including: ?Memory and ability to understand (cognition). ?Emotional well-being. ?Home life and relationship well-being. ?Sexual activity and sexual health. ?Lifestyle, including: ?Alcohol, nicotine or tobacco, and drug use. ?Access to firearms. ?Diet, exercise, and sleep habits. ?Work and work Statistician. ?Sunscreen use. ?Safety issues such as seatbelt and bike helmet use. ?Physical exam ?Your health care provider will check your: ?Height and weight. These may be used to calculate your BMI (body mass index). BMI is a measurement that tells if you are at a healthy weight. ?Waist circumference. This measures the distance around your waistline. This measurement also tells if you are at a healthy weight and may help predict your risk of certain diseases, such as type 2 diabetes and high blood pressure. ?Heart rate and blood pressure. ?Body temperature. ?Skin for abnormal  spots. ?What immunizations do I need? ? ?Vaccines are usually given at various ages, according to a schedule. Your health care provider will recommend vaccines for you based on your age, medical history, and lifestyle or other factors, such as travel or where you work. ?What tests do I need? ?Screening ?Your health care provider may recommend screening tests for certain conditions. This may include: ?Lipid and cholesterol levels. ?Hepatitis C test. ?Hepatitis B test. ?HIV (human immunodeficiency virus) test. ?STI (sexually transmitted infection) testing, if you are at risk. ?Lung cancer screening. ?Colorectal cancer screening. ?Diabetes screening. This is done by checking your blood sugar (glucose) after you have not eaten for a while (fasting). ?Mammogram. Talk with your health care provider about how often you should have regular mammograms. ?BRCA-related cancer screening. This may be done if you have a family history of breast, ovarian, tubal, or peritoneal cancers. ?Bone density scan. This is done to screen for osteoporosis. ?Talk with your health care provider about your test results, treatment options, and if necessary, the need for more tests. ?Follow these instructions at home: ?Eating and drinking ? ?Eat a diet that includes fresh fruits and vegetables, whole grains, lean protein, and low-fat dairy products. Limit your intake of foods with high amounts of sugar, saturated fats, and salt. ?Take vitamin and mineral supplements as recommended by your health care provider. ?Do not drink alcohol if your health care provider tells you not to drink. ?If you drink alcohol: ?Limit how much you have to 0-1 drink a day. ?Know how much alcohol is in your drink. In the [place] one drink equals one 12 oz bottle of beer (355 mL), one 5 oz glass of  wine (148 mL), or one 1? oz glass of hard liquor (44 mL). ?Lifestyle ?Brush your teeth every morning and night with fluoride toothpaste. Floss one time each day. ?Exercise for at  least 30 minutes 5 or more days each week. ?Do not use any products that contain nicotine or tobacco. These products include cigarettes, chewing tobacco, and vaping devices, such as e-cigarettes. If you need help quitting, ask your health care provider. ?Do not use drugs. ?If you are sexually active, practice safe sex. Use a condom or other form of protection in order to prevent STIs. ?Take aspirin only as told by your health care provider. Make sure that you understand how much to take and what form to take. Work with your health care provider to find out whether it is safe and beneficial for you to take aspirin daily. ?Ask your health care provider if you need to take a cholesterol-lowering medicine (statin). ?Find healthy ways to manage stress, such as: ?Meditation, yoga, or listening to music. ?Journaling. ?Talking to a trusted person. ?Spending time with friends and family. ?Minimize exposure to UV radiation to reduce your risk of skin cancer. ?Safety ?Always wear your seat belt while driving or riding in a vehicle. ?Do not drive: ?If you have been drinking alcohol. Do not ride with someone who has been drinking. ?When you are tired or distracted. ?While texting. ?If you have been using any mind-altering substances or drugs. ?Wear a helmet and other protective equipment during sports activities. ?If you have firearms in your house, make sure you follow all gun safety procedures. ?What's next? ?Visit your health care provider once a year for an annual wellness visit. ?Ask your health care provider how often you should have your eyes and teeth checked. ?Stay up to date on all vaccines. ?This information is not intended to replace advice given to you by your health care provider. Make sure you discuss any questions you have with your health care provider. ?Document Revised: 04/15/2021 Document Reviewed: 04/15/2021 ?Elsevier Patient Education ? [place]. ? ?

## 2022-02-18 NOTE — Assessment & Plan Note (Signed)
Had a cough on lisinopril so we switched to Losartan and while the cough improved it did not resolve, it is a tickle, non productive and tends to be worse at night. D/c Losartan and start Amlodipine 5 mg daily. BP and pulse check in 1 month and patient will monitor as well ?

## 2022-02-18 NOTE — Assessment & Plan Note (Signed)
Tolerating statin, encouraged heart healthy diet, avoid trans fats, minimize simple carbs and saturated fats. Increase exercise as tolerated 

## 2022-02-18 NOTE — Assessment & Plan Note (Signed)
Encouraged to get adequate exercise, calcium and vitamin d intake 

## 2022-02-18 NOTE — Assessment & Plan Note (Addendum)
Patient encouraged to maintain heart healthy diet, regular exercise, adequate sleep. Consider daily probiotics. Take medications as prescribed. Labs reviewed and ordered. Has ACP documents in chart. Last colonoscopy June 2017 repeat in 2027, MGM March 2023, repeat next year. Dexa March 2022 repeat in 2 years. Pap May 2022 repeat in 2025 ?

## 2022-02-25 ENCOUNTER — Other Ambulatory Visit (HOSPITAL_BASED_OUTPATIENT_CLINIC_OR_DEPARTMENT_OTHER): Payer: Self-pay

## 2022-02-25 ENCOUNTER — Other Ambulatory Visit: Payer: Self-pay | Admitting: Family Medicine

## 2022-02-25 MED ORDER — BLACK PEPPER-TURMERIC 3-500 MG PO CAPS
ORAL_CAPSULE | ORAL | 1 refills | Status: DC
  Filled 2022-02-25: qty 120, 60d supply, fill #0
  Filled 2022-04-19: qty 120, 60d supply, fill #1

## 2022-02-26 ENCOUNTER — Other Ambulatory Visit (HOSPITAL_BASED_OUTPATIENT_CLINIC_OR_DEPARTMENT_OTHER): Payer: Self-pay

## 2022-03-01 ENCOUNTER — Other Ambulatory Visit (HOSPITAL_BASED_OUTPATIENT_CLINIC_OR_DEPARTMENT_OTHER): Payer: Self-pay

## 2022-03-02 ENCOUNTER — Other Ambulatory Visit (HOSPITAL_BASED_OUTPATIENT_CLINIC_OR_DEPARTMENT_OTHER): Payer: Self-pay

## 2022-03-03 ENCOUNTER — Other Ambulatory Visit (HOSPITAL_BASED_OUTPATIENT_CLINIC_OR_DEPARTMENT_OTHER): Payer: Self-pay

## 2022-03-16 ENCOUNTER — Other Ambulatory Visit (HOSPITAL_BASED_OUTPATIENT_CLINIC_OR_DEPARTMENT_OTHER): Payer: Self-pay

## 2022-03-23 ENCOUNTER — Ambulatory Visit (INDEPENDENT_AMBULATORY_CARE_PROVIDER_SITE_OTHER): Payer: PPO | Admitting: Family Medicine

## 2022-03-23 VITALS — BP 126/76 | HR 71

## 2022-03-23 DIAGNOSIS — R030 Elevated blood-pressure reading, without diagnosis of hypertension: Secondary | ICD-10-CM

## 2022-03-23 NOTE — Progress Notes (Unsigned)
Pt here for Blood pressure check per PCP order from last visit on 02/18/22.   Had a cough on lisinopril so we switched to Losartan and while the cough improved it did not resolve, it is a tickle, non productive and tends to be worse at night. D/c Losartan and start Amlodipine 5 mg daily. BP and pulse check in 1 month and patient will monitor as well     Pt currently takes: amlodipine '5mg'$     Pt reports compliance with medication.  BP today @ = 126/76 HR = 71  Pt advised per Dr. Charlett Blake that blood pressure looks good, continue same medication, and we will see her at next visit in October.

## 2022-04-06 ENCOUNTER — Other Ambulatory Visit (HOSPITAL_BASED_OUTPATIENT_CLINIC_OR_DEPARTMENT_OTHER): Payer: Self-pay

## 2022-04-06 ENCOUNTER — Other Ambulatory Visit: Payer: Self-pay | Admitting: Family Medicine

## 2022-04-06 MED ORDER — CALCIUM CARB-CHOLECALCIFEROL 600-10 MG-MCG PO TABS
ORAL_TABLET | ORAL | 1 refills | Status: DC
  Filled 2022-04-06: qty 150, 50d supply, fill #0

## 2022-04-07 ENCOUNTER — Other Ambulatory Visit (HOSPITAL_BASED_OUTPATIENT_CLINIC_OR_DEPARTMENT_OTHER): Payer: Self-pay

## 2022-04-07 MED ORDER — ROSUVASTATIN CALCIUM 40 MG PO TABS
ORAL_TABLET | Freq: Every day | ORAL | 1 refills | Status: DC
  Filled 2022-04-07: qty 90, 90d supply, fill #0
  Filled 2022-07-09: qty 90, 90d supply, fill #1

## 2022-04-07 MED ORDER — METFORMIN HCL 500 MG PO TABS
500.0000 mg | ORAL_TABLET | Freq: Two times a day (BID) | ORAL | 1 refills | Status: DC
  Filled 2022-04-07: qty 180, 90d supply, fill #0
  Filled 2022-07-09: qty 180, 90d supply, fill #1

## 2022-04-08 ENCOUNTER — Other Ambulatory Visit (HOSPITAL_BASED_OUTPATIENT_CLINIC_OR_DEPARTMENT_OTHER): Payer: Self-pay

## 2022-04-19 ENCOUNTER — Other Ambulatory Visit (HOSPITAL_BASED_OUTPATIENT_CLINIC_OR_DEPARTMENT_OTHER): Payer: Self-pay

## 2022-04-20 ENCOUNTER — Other Ambulatory Visit (HOSPITAL_BASED_OUTPATIENT_CLINIC_OR_DEPARTMENT_OTHER): Payer: Self-pay

## 2022-04-20 DIAGNOSIS — L814 Other melanin hyperpigmentation: Secondary | ICD-10-CM | POA: Diagnosis not present

## 2022-04-20 DIAGNOSIS — R229 Localized swelling, mass and lump, unspecified: Secondary | ICD-10-CM | POA: Diagnosis not present

## 2022-04-20 DIAGNOSIS — D2262 Melanocytic nevi of left upper limb, including shoulder: Secondary | ICD-10-CM | POA: Diagnosis not present

## 2022-04-20 DIAGNOSIS — Z85828 Personal history of other malignant neoplasm of skin: Secondary | ICD-10-CM | POA: Diagnosis not present

## 2022-04-20 DIAGNOSIS — L821 Other seborrheic keratosis: Secondary | ICD-10-CM | POA: Diagnosis not present

## 2022-04-20 DIAGNOSIS — Z08 Encounter for follow-up examination after completed treatment for malignant neoplasm: Secondary | ICD-10-CM | POA: Diagnosis not present

## 2022-04-30 ENCOUNTER — Other Ambulatory Visit (HOSPITAL_BASED_OUTPATIENT_CLINIC_OR_DEPARTMENT_OTHER): Payer: Self-pay

## 2022-05-03 ENCOUNTER — Other Ambulatory Visit (HOSPITAL_BASED_OUTPATIENT_CLINIC_OR_DEPARTMENT_OTHER): Payer: Self-pay

## 2022-05-11 DIAGNOSIS — E119 Type 2 diabetes mellitus without complications: Secondary | ICD-10-CM | POA: Diagnosis not present

## 2022-05-11 DIAGNOSIS — H2513 Age-related nuclear cataract, bilateral: Secondary | ICD-10-CM | POA: Diagnosis not present

## 2022-05-11 DIAGNOSIS — H5203 Hypermetropia, bilateral: Secondary | ICD-10-CM | POA: Diagnosis not present

## 2022-05-11 DIAGNOSIS — H401113 Primary open-angle glaucoma, right eye, severe stage: Secondary | ICD-10-CM | POA: Diagnosis not present

## 2022-05-11 LAB — HM DIABETES EYE EXAM

## 2022-06-07 ENCOUNTER — Other Ambulatory Visit: Payer: Self-pay | Admitting: Family Medicine

## 2022-06-07 ENCOUNTER — Other Ambulatory Visit (HOSPITAL_BASED_OUTPATIENT_CLINIC_OR_DEPARTMENT_OTHER): Payer: Self-pay

## 2022-06-07 MED ORDER — AMLODIPINE BESYLATE 5 MG PO TABS
5.0000 mg | ORAL_TABLET | Freq: Every day | ORAL | 3 refills | Status: DC
  Filled 2022-06-07: qty 30, 30d supply, fill #0
  Filled 2022-07-09: qty 30, 30d supply, fill #1
  Filled 2022-09-07: qty 30, 30d supply, fill #2

## 2022-06-08 ENCOUNTER — Other Ambulatory Visit (HOSPITAL_BASED_OUTPATIENT_CLINIC_OR_DEPARTMENT_OTHER): Payer: Self-pay

## 2022-06-25 ENCOUNTER — Other Ambulatory Visit: Payer: Self-pay | Admitting: Family Medicine

## 2022-06-25 ENCOUNTER — Other Ambulatory Visit (HOSPITAL_BASED_OUTPATIENT_CLINIC_OR_DEPARTMENT_OTHER): Payer: Self-pay

## 2022-06-28 ENCOUNTER — Other Ambulatory Visit (HOSPITAL_BASED_OUTPATIENT_CLINIC_OR_DEPARTMENT_OTHER): Payer: Self-pay

## 2022-07-09 ENCOUNTER — Other Ambulatory Visit (HOSPITAL_BASED_OUTPATIENT_CLINIC_OR_DEPARTMENT_OTHER): Payer: Self-pay

## 2022-07-23 ENCOUNTER — Other Ambulatory Visit (HOSPITAL_BASED_OUTPATIENT_CLINIC_OR_DEPARTMENT_OTHER): Payer: Self-pay

## 2022-08-17 ENCOUNTER — Encounter: Payer: Self-pay | Admitting: Family Medicine

## 2022-08-17 DIAGNOSIS — E782 Mixed hyperlipidemia: Secondary | ICD-10-CM

## 2022-08-17 DIAGNOSIS — R030 Elevated blood-pressure reading, without diagnosis of hypertension: Secondary | ICD-10-CM

## 2022-08-17 DIAGNOSIS — E119 Type 2 diabetes mellitus without complications: Secondary | ICD-10-CM

## 2022-08-17 DIAGNOSIS — Z0000 Encounter for general adult medical examination without abnormal findings: Secondary | ICD-10-CM

## 2022-08-19 NOTE — Telephone Encounter (Signed)
Called pt lvm that labs has been ordered  Just need to make lab appt

## 2022-08-20 ENCOUNTER — Other Ambulatory Visit (INDEPENDENT_AMBULATORY_CARE_PROVIDER_SITE_OTHER): Payer: PPO

## 2022-08-20 DIAGNOSIS — E119 Type 2 diabetes mellitus without complications: Secondary | ICD-10-CM

## 2022-08-20 DIAGNOSIS — Z0000 Encounter for general adult medical examination without abnormal findings: Secondary | ICD-10-CM | POA: Diagnosis not present

## 2022-08-20 DIAGNOSIS — R030 Elevated blood-pressure reading, without diagnosis of hypertension: Secondary | ICD-10-CM

## 2022-08-20 DIAGNOSIS — E782 Mixed hyperlipidemia: Secondary | ICD-10-CM

## 2022-08-20 LAB — TSH: TSH: 1.77 u[IU]/mL (ref 0.35–5.50)

## 2022-08-20 LAB — LIPID PANEL
Cholesterol: 153 mg/dL (ref 0–200)
HDL: 62.5 mg/dL (ref >39.00)
LDL Cholesterol: 67 mg/dL (ref 0–99)
NonHDL: 90.99
Total CHOL/HDL Ratio: 2
Triglycerides: 121 mg/dL (ref 0.0–149.0)
VLDL: 24.2 mg/dL (ref 0.0–40.0)

## 2022-08-20 LAB — CBC
HCT: 40.6 % (ref 36.0–46.0)
Hemoglobin: 13.2 g/dL (ref 12.0–15.0)
MCHC: 32.6 g/dL (ref 30.0–36.0)
MCV: 86.8 fl (ref 78.0–100.0)
Platelets: 273 10*3/uL (ref 150.0–400.0)
RBC: 4.67 Mil/uL (ref 3.87–5.11)
RDW: 13.2 % (ref 11.5–15.5)
WBC: 5.9 10*3/uL (ref 4.0–10.5)

## 2022-08-20 LAB — COMPREHENSIVE METABOLIC PANEL
ALT: 24 U/L (ref 0–35)
AST: 22 U/L (ref 0–37)
Albumin: 4.7 g/dL (ref 3.5–5.2)
Alkaline Phosphatase: 65 U/L (ref 39–117)
BUN: 15 mg/dL (ref 6–23)
CO2: 26 mEq/L (ref 19–32)
Calcium: 9.8 mg/dL (ref 8.4–10.5)
Chloride: 103 mEq/L (ref 96–112)
Creatinine, Ser: 0.84 mg/dL (ref 0.40–1.20)
GFR: 69.98 mL/min (ref >60.00)
Glucose, Bld: 159 mg/dL — ABNORMAL HIGH (ref 70–99)
Potassium: 4.1 mEq/L (ref 3.5–5.1)
Sodium: 139 mEq/L (ref 135–145)
Total Bilirubin: 1.6 mg/dL — ABNORMAL HIGH (ref 0.2–1.2)
Total Protein: 6.8 g/dL (ref 6.0–8.3)

## 2022-08-20 LAB — HEMOGLOBIN A1C: Hgb A1c MFr Bld: 7.9 % — ABNORMAL HIGH (ref 4.6–6.5)

## 2022-08-22 NOTE — Assessment & Plan Note (Signed)
Tolerating statin, encouraged heart healthy diet, avoid trans fats, minimize simple carbs and saturated fats. Increase exercise as tolerated 

## 2022-08-22 NOTE — Assessment & Plan Note (Signed)
Encouraged to get adequate exercise, calcium and vitamin d intake 

## 2022-08-22 NOTE — Assessment & Plan Note (Signed)
hgba1c acceptable, minimize simple carbs. Increase exercise as tolerated. Continue current meds. RSV (respiratory syncitial virus) vaccine at pharmacy, Arexvy Covid booster when new version at pharmacy High dose flu shot

## 2022-08-22 NOTE — Progress Notes (Unsigned)
Subjective:    Patient ID: Priscilla Butler, female    DOB: [birth year], 71 y.o.   MRN: [id number]  No chief complaint on file.   HPI Patient is in today for follow up on chronic medical concerns. No recent febrile illness or acute hospitalizations. No complaints of polyuria or polydipsia. Is trying to maintain a heart healthy diet and stay active. Denies CP/palp/SOB/HA/congestion/fevers/GI or GU c/o. 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   DeQuervain's disease (tenosynovitis)    DM (diabetes mellitus), type 2 ([place])    H/O measles    H/O mumps    History of chicken pox    Hx of TB skin testing    Hyperglycemia    Hyperlipidemia    Menopause    Osteopenia    Otitis externa of left ear 01/30/2017   Preventative health care 01/19/2016   Welcome to Medicare preventive visit 01/27/2017    Past Surgical History:  Procedure Laterality Date   BREAST LUMPECTOMY  03/2008   Right breast   MOHS SURGERY  2013   BCC right upper lip, [place] skin surgery   SKIN SURGERY     facial  skin cancer   WISDOM TOOTH EXTRACTION  1972    Family History  Problem Relation Age of Onset   Breast cancer Maternal Grandmother    Cancer Maternal Grandmother 56       ish   Cancer Maternal Grandfather        lung   Hypertension Mother    Hyperlipidemia Mother    Arthritis Mother    Other Paternal Grandmother     Social History   Socioeconomic History   Marital status: Married    Spouse name: [name]   Number of children: 1   Years of education: BA   Highest education level: Not on file  Occupational History   Occupation: Retired    Fish farm manager: UNEMPLOYED  Tobacco Use   Smoking status: Former    Types: Cigarettes    Quit date: 11/02/1999    Years since quitting: 22.8   Smokeless tobacco: Never  Substance and Sexual Activity   Alcohol use: Yes     Alcohol/week: 3.0 standard drinks of alcohol    Types: 3 Cans of beer per week   Drug use: No   Sexual activity: Yes    Partners: Male    Birth control/protection: Post-menopausal    Comment: lives with husband, retired from Press photographer, Therapist, music, No dietary restrictions  Other Topics Concern   Not on file  Social History Narrative   Not on file   Social Determinants of Health   Financial Resource Strain: Not on file  Food Insecurity: Not on file  Transportation Needs: Not on file  Physical Activity: Not on file  Stress: Not on file  Social Connections: Not on file  Intimate Partner Violence: Not on file    Outpatient Medications Prior to Visit  Medication Sig Dispense Refill   amLODipine (NORVASC) 5 MG tablet Take 1 tablet (5 mg total) by mouth daily. 30 tablet 3   aspirin EC 81 MG tablet TAKE 1 TABLET (81 MG TOTAL) BY MOUTH DAILY. 120 tablet 3   Black Pepper-Turmeric (TURMERIC COMPLEX/BLACK PEPPER) 3-500 MG CAPS TAKE 1 CAPSULE BY MOUTH 2 (TWO) TIMES DAILY. 120 capsule 1   Calcium Carb-Cholecalciferol 600-10 MG-MCG TABS Take 1  tablet by mouth 2 times daily 180 tablet 1   Calcium Carbonate-Vitamin D3 600-400 MG-UNIT TABS Take 1 tablet by mouth 2 (two) times daily. 180 tablet 1   cholecalciferol (VITAMIN D) 25 MCG (1000 UNIT) tablet TAKE 1 TABLET BY MOUTH ONCE DAILY 100 tablet 1   CRANBERRY PO Take 84 mg by mouth daily.     dorzolamide-timolol (COSOPT) 22.3-6.8 MG/ML ophthalmic solution Place 1 drop into both eyes 2 (two) times daily. 10 mL 12   Fluocinolone Acetonide 0.01 % OIL Place 1 drop into the ear as directed 2 times daily as needed for itching 20 mL 1   glucose blood (ONETOUCH ULTRA) test strip USE TO TEST BLOOD SUGAR TWICE A DAY AS DIRECTED 100 strip 12   influenza vaccine adjuvanted (FLUAD) 0.5 ML injection Inject into the muscle. 0.5 mL 0   latanoprost (XALATAN) 0.005 % ophthalmic solution PLACE 1 DROP INTO BOTH EYES AT BEDTIME 10 mL 4   metFORMIN (GLUCOPHAGE) 500  MG tablet Take 1 tablet (500 mg total) by mouth 2 (two) times daily with a meal. 180 tablet 1   Multiple Vitamin (MULTIVITAMIN) tablet Take 1 tablet by mouth daily.     Omega-3 Fatty Acids (FISH OIL) 1000 MG CAPS Take 1 capsule by mouth 2 (two) times daily.     Probiotic Product (PROBIOTIC DAILY) CAPS Take 1 capsule by mouth daily. NOW company     Psyllium (METAMUCIL SMOOTH TEXTURE) 28.3 % POWD 1 tsp daily in water     rosuvastatin (CRESTOR) 40 MG tablet TAKE 1 TABLET BY MOUTH ONCE DAILY 90 tablet 1   No facility-administered medications prior to visit.    Allergies  Allergen Reactions   Lisinopril Cough    Review of Systems  Constitutional:  Negative for fever and malaise/fatigue.  HENT:  Negative for congestion.   Eyes:  Negative for blurred vision.  Respiratory:  Negative for shortness of breath.   Cardiovascular:  Negative for chest pain, palpitations and leg swelling.  Gastrointestinal:  Negative for abdominal pain, blood in stool and nausea.  Genitourinary:  Negative for dysuria and frequency.  Musculoskeletal:  Negative for falls.  Skin:  Negative for rash.  Neurological:  Negative for dizziness, loss of consciousness and headaches.  Endo/Heme/Allergies:  Negative for environmental allergies.  Psychiatric/Behavioral:  Negative for depression. The patient is not nervous/anxious.        Objective:    Physical Exam Constitutional:      General: She is not in acute distress.    Appearance: She is well-developed.  HENT:     Head: Normocephalic and atraumatic.  Eyes:     Conjunctiva/sclera: Conjunctivae normal.  Neck:     Thyroid: No thyromegaly.  Cardiovascular:     Rate and Rhythm: Normal rate and regular rhythm.     Heart sounds: Normal heart sounds. No murmur heard. Pulmonary:     Effort: Pulmonary effort is normal. No respiratory distress.     Breath sounds: Normal breath sounds.  Abdominal:     General: Bowel sounds are normal. There is no distension.      Palpations: Abdomen is soft. There is no mass.     Tenderness: There is no abdominal tenderness.  Musculoskeletal:     Cervical back: Neck supple.  Lymphadenopathy:     Cervical: No cervical adenopathy.  Skin:    General: Skin is warm and dry.  Neurological:     Mental Status: She is alert and oriented to person, place, and time.  Psychiatric:  Behavior: Behavior normal.     There were no vitals taken for this visit. Wt Readings from Last 3 Encounters:  02/18/22 161 lb 9.6 oz (73.3 kg)  03/23/21 160 lb 12.8 oz (72.9 kg)  01/26/21 160 lb (72.6 kg)    Diabetic Foot Exam - Simple   No data filed    Lab Results  Component Value Date   WBC 5.9 08/20/2022   HGB 13.2 08/20/2022   HCT 40.6 08/20/2022   PLT 273.0 08/20/2022   GLUCOSE 159 (H) 08/20/2022   CHOL 153 08/20/2022   TRIG 121.0 08/20/2022   HDL 62.50 08/20/2022   LDLCALC 67 08/20/2022   ALT 24 08/20/2022   AST 22 08/20/2022   NA 139 08/20/2022   K 4.1 08/20/2022   CL 103 08/20/2022   CREATININE 0.84 08/20/2022   BUN 15 08/20/2022   CO2 26 08/20/2022   TSH 1.77 08/20/2022   HGBA1C 7.9 (H) 08/20/2022   MICROALBUR 2.6 01/27/2017    Lab Results  Component Value Date   TSH 1.77 08/20/2022   Lab Results  Component Value Date   WBC 5.9 08/20/2022   HGB 13.2 08/20/2022   HCT 40.6 08/20/2022   MCV 86.8 08/20/2022   PLT 273.0 08/20/2022   Lab Results  Component Value Date   NA 139 08/20/2022   K 4.1 08/20/2022   CO2 26 08/20/2022   GLUCOSE 159 (H) 08/20/2022   BUN 15 08/20/2022   CREATININE 0.84 08/20/2022   BILITOT 1.6 (H) 08/20/2022   ALKPHOS 65 08/20/2022   AST 22 08/20/2022   ALT 24 08/20/2022   PROT 6.8 08/20/2022   ALBUMIN 4.7 08/20/2022   CALCIUM 9.8 08/20/2022   GFR 69.98 08/20/2022   Lab Results  Component Value Date   CHOL 153 08/20/2022   Lab Results  Component Value Date   HDL 62.50 08/20/2022   Lab Results  Component Value Date   LDLCALC 67 08/20/2022   Lab Results   Component Value Date   TRIG 121.0 08/20/2022   Lab Results  Component Value Date   CHOLHDL 2 08/20/2022   Lab Results  Component Value Date   HGBA1C 7.9 (H) 08/20/2022       Assessment & Plan:   Problem List Items Addressed This Visit     DM (diabetes mellitus), type 2 ([place])    hgba1c acceptable, minimize simple carbs. Increase exercise as tolerated. Continue current meds. RSV (respiratory syncitial virus) vaccine at pharmacy, Arexvy Covid booster when new version at pharmacy High dose flu shot      Osteopenia    Encouraged to get adequate exercise, calcium and vitamin d intake       I am having Banessa R. Owens Shark maintain her multivitamin, Fish Oil, CRANBERRY PO, Probiotic Daily, Psyllium, latanoprost, influenza vaccine adjuvanted, Calcium Carbonate-Vitamin D3, Fluocinolone Acetonide, OneTouch Ultra, dorzolamide-timolol, cholecalciferol, aspirin EC, Black Pepper-Turmeric, rosuvastatin, metFORMIN, Calcium Carb-Cholecalciferol, and amLODipine.  No orders of the defined types were placed in this encounter.    Penni Homans, MD

## 2022-08-23 ENCOUNTER — Ambulatory Visit (INDEPENDENT_AMBULATORY_CARE_PROVIDER_SITE_OTHER): Payer: PPO | Admitting: Family Medicine

## 2022-08-23 VITALS — BP 124/78 | HR 74 | Temp 98.0°F | Resp 16 | Ht 62.0 in | Wt 159.2 lb

## 2022-08-23 DIAGNOSIS — E782 Mixed hyperlipidemia: Secondary | ICD-10-CM | POA: Diagnosis not present

## 2022-08-23 DIAGNOSIS — E119 Type 2 diabetes mellitus without complications: Secondary | ICD-10-CM | POA: Diagnosis not present

## 2022-08-23 DIAGNOSIS — M8580 Other specified disorders of bone density and structure, unspecified site: Secondary | ICD-10-CM | POA: Diagnosis not present

## 2022-08-23 DIAGNOSIS — R030 Elevated blood-pressure reading, without diagnosis of hypertension: Secondary | ICD-10-CM | POA: Diagnosis not present

## 2022-08-23 NOTE — Patient Instructions (Signed)

## 2022-08-23 NOTE — Assessment & Plan Note (Signed)
WNL today 

## 2022-08-25 ENCOUNTER — Ambulatory Visit (INDEPENDENT_AMBULATORY_CARE_PROVIDER_SITE_OTHER): Payer: PPO | Admitting: *Deleted

## 2022-08-25 DIAGNOSIS — Z0000 Encounter for general adult medical examination without abnormal findings: Secondary | ICD-10-CM | POA: Diagnosis not present

## 2022-08-25 NOTE — Patient Instructions (Signed)
Priscilla Butler , Thank you for taking time to come for your Medicare Wellness Visit. I appreciate your ongoing commitment to your health goals. Please review the following plan we discussed and let me know if I can assist you in the future.   These are the goals we discussed:  Goals   None     This is a list of the screening recommended for you and due dates:  Health Maintenance  Topic Date Due   Complete foot exam   Never done   Yearly kidney health urinalysis for diabetes  Never done   Stool Blood Test  04/08/2017   Flu Shot  06/01/2022   COVID-19 Vaccine (5 - Pfizer risk series) 10/29/2022*   Hemoglobin A1C  02/19/2023   Eye exam for diabetics  05/12/2023   Yearly kidney function blood test for diabetes  08/21/2023   Medicare Annual Wellness Visit  09/25/2023   Mammogram  01/12/2024   Colon Cancer Screening  04/08/2026   Tetanus Vaccine  03/24/2031   Pneumonia Vaccine  Completed   DEXA scan (bone density measurement)  Completed   Hepatitis C Screening: USPSTF Recommendation to screen - Ages 24-79 yo.  Completed   Zoster (Shingles) Vaccine  Completed   HPV Vaccine  Aged Out  *Topic was postponed. The date shown is not the original due date.     Next appointment: Follow up in one year for your annual wellness visit    Preventive Care 65 Years and Older, Female Preventive care refers to lifestyle choices and visits with your health care provider that can promote health and wellness. What does preventive care include? A yearly physical exam. This is also called an annual well check. Dental exams once or twice a year. Routine eye exams. Ask your health care provider how often you should have your eyes checked. Personal lifestyle choices, including: Daily care of your teeth and gums. Regular physical activity. Eating a healthy diet. Avoiding tobacco and drug use. Limiting alcohol use. Practicing safe sex. Taking low-dose aspirin every day. Taking vitamin and mineral  supplements as recommended by your health care provider. What happens during an annual well check? The services and screenings done by your health care provider during your annual well check will depend on your age, overall health, lifestyle risk factors, and family history of disease. Counseling  Your health care provider may ask you questions about your: Alcohol use. Tobacco use. Drug use. Emotional well-being. Home and relationship well-being. Sexual activity. Eating habits. History of falls. Memory and ability to understand (cognition). Work and work Statistician. Reproductive health. Screening  You may have the following tests or measurements: Height, weight, and BMI. Blood pressure. Lipid and cholesterol levels. These may be checked every 5 years, or more frequently if you are over 41 years old. Skin check. Lung cancer screening. You may have this screening every year starting at age 43 if you have a 30-pack-year history of smoking and currently smoke or have quit within the past 15 years. Fecal occult blood test (FOBT) of the stool. You may have this test every year starting at age 11. Flexible sigmoidoscopy or colonoscopy. You may have a sigmoidoscopy every 5 years or a colonoscopy every 10 years starting at age 24. Hepatitis C blood test. Hepatitis B blood test. Sexually transmitted disease (STD) testing. Diabetes screening. This is done by checking your blood sugar (glucose) after you have not eaten for a while (fasting). You may have this done every 1-3 years. Bone density scan.  This is done to screen for osteoporosis. You may have this done starting at age 20. Mammogram. This may be done every 1-2 years. Talk to your health care provider about how often you should have regular mammograms. Talk with your health care provider about your test results, treatment options, and if necessary, the need for more tests. Vaccines  Your health care provider may recommend certain  vaccines, such as: Influenza vaccine. This is recommended every year. Tetanus, diphtheria, and acellular pertussis (Tdap, Td) vaccine. You may need a Td booster every 10 years. Zoster vaccine. You may need this after age 27. Pneumococcal 13-valent conjugate (PCV13) vaccine. One dose is recommended after age 50. Pneumococcal polysaccharide (PPSV23) vaccine. One dose is recommended after age 54. Talk to your health care provider about which screenings and vaccines you need and how often you need them. This information is not intended to replace advice given to you by your health care provider. Make sure you discuss any questions you have with your health care provider. Document Released: 11/14/2015 Document Revised: 07/07/2016 Document Reviewed: 08/19/2015 Elsevier Interactive Patient Education  2017 [place] Prevention in the Home Falls can cause injuries. They can happen to people of all ages. There are many things you can do to make your home safe and to help prevent falls. What can I do on the outside of my home? Regularly fix the edges of walkways and driveways and fix any cracks. Remove anything that might make you trip as you walk through a door, such as a raised step or threshold. Trim any bushes or trees on the path to your home. Use bright outdoor lighting. Clear any walking paths of anything that might make someone trip, such as rocks or tools. Regularly check to see if handrails are loose or broken. Make sure that both sides of any steps have handrails. Any raised decks and porches should have guardrails on the edges. Have any leaves, snow, or ice cleared regularly. Use sand or salt on walking paths during winter. Clean up any spills in your garage right away. This includes oil or grease spills. What can I do in the bathroom? Use night lights. Install grab bars by the toilet and in the tub and shower. Do not use towel bars as grab bars. Use non-skid mats or decals in  the tub or shower. If you need to sit down in the shower, use a plastic, non-slip stool. Keep the floor dry. Clean up any water that spills on the floor as soon as it happens. Remove soap buildup in the tub or shower regularly. Attach bath mats securely with double-sided non-slip rug tape. Do not have throw rugs and other things on the floor that can make you trip. What can I do in the bedroom? Use night lights. Make sure that you have a light by your bed that is easy to reach. Do not use any sheets or blankets that are too big for your bed. They should not hang down onto the floor. Have a firm chair that has side arms. You can use this for support while you get dressed. Do not have throw rugs and other things on the floor that can make you trip. What can I do in the kitchen? Clean up any spills right away. Avoid walking on wet floors. Keep items that you use a lot in easy-to-reach places. If you need to reach something above you, use a strong step stool that has a grab bar. Keep electrical cords  out of the way. Do not use floor polish or wax that makes floors slippery. If you must use wax, use non-skid floor wax. Do not have throw rugs and other things on the floor that can make you trip. What can I do with my stairs? Do not leave any items on the stairs. Make sure that there are handrails on both sides of the stairs and use them. Fix handrails that are broken or loose. Make sure that handrails are as long as the stairways. Check any carpeting to make sure that it is firmly attached to the stairs. Fix any carpet that is loose or worn. Avoid having throw rugs at the top or bottom of the stairs. If you do have throw rugs, attach them to the floor with carpet tape. Make sure that you have a light switch at the top of the stairs and the bottom of the stairs. If you do not have them, ask someone to add them for you. What else can I do to help prevent falls? Wear shoes that: Do not have high  heels. Have rubber bottoms. Are comfortable and fit you well. Are closed at the toe. Do not wear sandals. If you use a stepladder: Make sure that it is fully opened. Do not climb a closed stepladder. Make sure that both sides of the stepladder are locked into place. Ask someone to hold it for you, if possible. Clearly mark and make sure that you can see: Any grab bars or handrails. First and last steps. Where the edge of each step is. Use tools that help you move around (mobility aids) if they are needed. These include: Canes. Walkers. Scooters. Crutches. Turn on the lights when you go into a dark area. Replace any light bulbs as soon as they burn out. Set up your furniture so you have a clear path. Avoid moving your furniture around. If any of your floors are uneven, fix them. If there are any pets around you, be aware of where they are. Review your medicines with your doctor. Some medicines can make you feel dizzy. This can increase your chance of falling. Ask your doctor what other things that you can do to help prevent falls. This information is not intended to replace advice given to you by your health care provider. Make sure you discuss any questions you have with your health care provider. Document Released: 08/14/2009 Document Revised: 03/25/2016 Document Reviewed: 11/22/2014 Elsevier Interactive Patient Education  2017 [employer].

## 2022-08-25 NOTE — Progress Notes (Signed)
Subjective:   Priscilla Butler is a 71 y.o. female who presents for Medicare Annual (Subsequent) preventive examination.  I connected with  Felipa Furnace on 08/25/22 by a audio enabled telemedicine application and verified that I am speaking with the correct person using two identifiers.  Patient Location: Home  Provider Location: Office/Clinic  I discussed the limitations of evaluation and management by telemedicine. The patient expressed understanding and agreed to proceed.   Review of Systems    Defer to PCP Cardiac Risk Factors include: advanced age (>98mn, >>36women);dyslipidemia;diabetes mellitus     Objective:    There were no vitals filed for this visit. There is no height or weight on file to calculate BMI.     08/25/2022    3:03 PM 01/27/2017    2:16 PM  Advanced Directives  Does Patient Have a Medical Advance Directive? Yes Yes  Type of [place] Out of facility DNR (pink MOST or yellow form);[place]  Does patient want to make changes to medical advance directive? No - Patient declined Yes (MAU/Ambulatory/Procedural Areas - Information given)  Copy of [place] Chart? Yes - validated most recent copy scanned in chart (See row information) No - copy requested    Current Medications (verified) Outpatient Encounter Medications as of 08/25/2022  Medication Sig   amLODipine (NORVASC) 5 MG tablet Take 1 tablet (5 mg total) by mouth daily.   aspirin EC 81 MG tablet TAKE 1 TABLET (81 MG TOTAL) BY MOUTH DAILY.   Black Pepper-Turmeric (TURMERIC COMPLEX/BLACK PEPPER) 3-500 MG CAPS TAKE 1 CAPSULE BY MOUTH 2 (TWO) TIMES DAILY.   Calcium Carbonate-Vitamin D3 600-400 MG-UNIT TABS Take 1 tablet by mouth 2 (two) times daily.   cholecalciferol (VITAMIN D) 25 MCG (1000 UNIT) tablet TAKE 1 TABLET BY MOUTH ONCE DAILY   CRANBERRY PO Take 84 mg by mouth daily.   dorzolamide-timolol  (COSOPT) 22.3-6.8 MG/ML ophthalmic solution Place 1 drop into both eyes 2 (two) times daily.   Fluocinolone Acetonide 0.01 % OIL Place 1 drop into the ear as directed 2 times daily as needed for itching   glucose blood (ONETOUCH ULTRA) test strip USE TO TEST BLOOD SUGAR TWICE A DAY AS DIRECTED   influenza vaccine adjuvanted (FLUAD) 0.5 ML injection Inject into the muscle.   latanoprost (XALATAN) 0.005 % ophthalmic solution PLACE 1 DROP INTO BOTH EYES AT BEDTIME   metFORMIN (GLUCOPHAGE) 500 MG tablet Take 1 tablet (500 mg total) by mouth 2 (two) times daily with a meal.   Multiple Vitamin (MULTIVITAMIN) tablet Take 1 tablet by mouth daily.   Omega-3 Fatty Acids (FISH OIL) 1000 MG CAPS Take 1 capsule by mouth 2 (two) times daily.   Probiotic Product (PROBIOTIC DAILY) CAPS Take 1 capsule by mouth daily. NOW company   Psyllium (METAMUCIL SMOOTH TEXTURE) 28.3 % POWD 1 tsp daily in water   rosuvastatin (CRESTOR) 40 MG tablet TAKE 1 TABLET BY MOUTH ONCE DAILY   [DISCONTINUED] Calcium Carb-Cholecalciferol 600-10 MG-MCG TABS Take 1 tablet by mouth 2 times daily   No facility-administered encounter medications on file as of 08/25/2022.    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 ([place] 01/19/2016   DeQuervain's disease (tenosynovitis)    DM (diabetes mellitus), type 2 ([place]    H/O measles  H/O mumps    History of chicken pox    Hx of TB skin testing    Hyperglycemia    Hyperlipidemia    Menopause    Osteopenia    Otitis externa of left ear 01/30/2017   Preventative health care 01/19/2016   Welcome to Medicare preventive visit 01/27/2017   Past Surgical History:  Procedure Laterality Date   BREAST LUMPECTOMY  03/2008   Right breast   MOHS SURGERY  2013   BCC right upper lip, [facility] skin surgery   SKIN SURGERY     facial  skin cancer   WISDOM TOOTH  EXTRACTION  1972   Family History  Problem Relation Age of Onset   Breast cancer Maternal Grandmother    Cancer Maternal Grandmother 30       ish   Cancer Maternal Grandfather        lung   Hypertension Mother    Hyperlipidemia Mother    Arthritis Mother    Other Paternal Grandmother    Social History   Socioeconomic History   Marital status: Married    Spouse name: [name]   Number of children: 1   Years of education: BA   Highest education level: Not on file  Occupational History   Occupation: Retired    Fish farm manager: UNEMPLOYED  Tobacco Use   Smoking status: Former    Types: Cigarettes    Quit date: 11/02/1999    Years since quitting: 22.8   Smokeless tobacco: Never  Substance and Sexual Activity   Alcohol use: Yes    Alcohol/week: 3.0 standard drinks of alcohol    Types: 3 Cans of beer per week   Drug use: No   Sexual activity: Yes    Partners: Male    Birth control/protection: Post-menopausal    Comment: lives with husband, retired from Press photographer, Therapist, music, No dietary restrictions  Other Topics Concern   Not on file  Social History Narrative   Not on file   Social Determinants of Health   Financial Resource Strain: Low Risk  (08/25/2022)   Overall Financial Resource Strain (CARDIA)    Difficulty of Paying Living Expenses: Not hard at all  Food Insecurity: No Food Insecurity (08/25/2022)   Hunger Vital Sign    Worried About Running Out of Food in the Last Year: Never true    [place] in the Last Year: Never true  Transportation Needs: No Transportation Needs (08/25/2022)   PRAPARE - Hydrologist (Medical): No    Lack of Transportation (Non-Medical): No  Physical Activity: Insufficiently Active (08/25/2022)   Exercise Vital Sign    Days of Exercise per Week: 4 days    Minutes of Exercise per Session: 30 min  Stress: Stress Concern Present (08/25/2022)   [place]    Feeling of Stress : To some extent  Social Connections: Moderately Integrated (08/25/2022)   Social Connection and Isolation Panel [NHANES]    Frequency of Communication with Friends and Family: Once a week    Frequency of Social Gatherings with Friends and Family: More than three times a week    Attends Religious Services: Never    Marine scientist or Organizations: Yes    Attends Archivist Meetings: Never    Marital Status: Married    Tobacco Counseling Counseling given: Not Answered   Clinical Intake:  Pre-visit preparation completed: Yes  Pain : No/denies pain  How often do you need to have someone help you when you read instructions, pamphlets, or other written materials from your doctor or pharmacy?: 1 - Never  Diabetic? Yes Nutrition Risk Assessment:  Has the patient had any N/V/D within the last 2 months?  No  Does the patient have any non-healing wounds?  No  Has the patient had any unintentional weight loss or weight gain?  No   Diabetes:  Is the patient diabetic?  Yes  If diabetic, was a CBG obtained today?  No  Did the patient bring in their glucometer from home?   No, audio visit How often do you monitor your CBG's? Twice a week.   Financial Strains and Diabetes Management:  Are you having any financial strains with the device, your supplies or your medication? No .  Does the patient want to be seen by Chronic Care Management for management of their diabetes?  No  Would the patient like to be referred to a Nutritionist or for Diabetic Management?  No   Diabetic Exams:  Diabetic Eye Exam: Completed 05/11/22 Diabetic Foot Exam: Overdue, Pt has been advised about the importance in completing this exam. Pt is scheduled for diabetic foot exam on N/a.   Interpreter Needed?: No  Information entered by :: Beatris Ship, [place]   Activities of Daily Living    08/25/2022    3:09 PM 08/23/2022    9:10 PM  In your present state  of health, do you have any difficulty performing the following activities:  Hearing? 0 0  Vision? 0 0  Difficulty concentrating or making decisions? 0 0  Walking or climbing stairs? 0 0  Dressing or bathing? 0 0  Doing errands, shopping? 0 0  Preparing Food and eating ? N N  Using the Toilet? N N  In the past six months, have you accidently leaked urine? Y N  Comment stress incontinence   Do you have problems with loss of bowel control? N N  Managing your Medications? N N  Managing your Finances? N N  Housekeeping or managing your Housekeeping? N N    Patient Care Team: Mosie Lukes, MD as PCP - General (Family Medicine) Ronald Lobo, MD as Consulting Physician (Gastroenterology) Marygrace Drought, MD as Consulting Physician (Ophthalmology) [facility] as Consulting Physician (Dermatology)  Indicate any recent Medical Services you may have received from other than [facility] providers in the past year (date may be approximate).     Assessment:   This is a routine wellness examination for Adasha.  Hearing/Vision screen No results found.  Dietary issues and exercise activities discussed: Current Exercise Habits: Structured exercise class, Type of exercise: Other - see comments;strength training/weights (stationary bike), Time (Minutes): 30, Frequency (Times/Week): 5, Weekly Exercise (Minutes/Week): 150, Intensity: Moderate, Exercise limited by: None identified   Goals Addressed   None    Depression Screen    08/25/2022    3:03 PM 08/23/2022   11:28 AM 02/18/2022   11:03 AM 03/23/2021    2:28 PM 12/09/2020    1:53 PM 02/06/2018    1:45 PM 01/27/2017    1:49 PM  PHQ 2/9 Scores  PHQ - 2 Score 0 0 0 0 0 0 0  PHQ- 9 Score  0 0  0      Fall Risk    08/25/2022    3:03 PM 08/23/2022    9:10 PM 08/23/2022   11:27 AM 02/18/2022   11:02 AM 03/23/2021    2:28 PM  Fall Risk   Falls in the past year? 0 0 0 1 0  Number falls in past yr: 0 0 0 1 0  Injury with Fall? 0 0 0 0  0  Risk for fall due to : No Fall Risks   History of fall(s)   Follow up Falls evaluation completed  Falls evaluation completed Falls evaluation completed     [place]:  Any stairs in or around the home? Yes  If so, are there any without handrails? No  Home free of loose throw rugs in walkways, pet beds, electrical cords, etc? Yes  Adequate lighting in your home to reduce risk of falls? Yes   ASSISTIVE DEVICES UTILIZED TO PREVENT FALLS:  Life alert? No  Use of a cane, walker or w/c? No  Grab bars in the bathroom? No  Shower chair or bench in shower? Yes  Elevated toilet seat or a handicapped toilet? No   TIMED UP AND GO:  Was the test performed?  No, audio visit .   Cognitive Function:        08/25/2022    3:13 PM  6CIT Screen  What Year? 0 points  What month? 0 points  What time? 0 points  Count back from 20 0 points  Months in reverse 0 points  Repeat phrase 0 points  Total Score 0 points    Immunizations Immunization History  Administered Date(s) Administered   Fluad Quad(high Dose 65+) 08/04/2021   Hepatitis A 12/29/2012   Hepatitis A, Adult 07/25/2013   Influenza Split 08/02/2012   Influenza, High Dose Seasonal PF 07/22/2016, 07/28/2017, 07/24/2018   Influenza,inj,Quad PF,6+ Mos 07/25/2013   Influenza-Unspecified 08/12/2014, 08/16/2015, 07/28/2020   Moderna SARS-COV2 Booster Vaccination 08/29/2020   PFIZER Comirnaty(Gray Top)Covid-19 Tri-Sucrose Vaccine 04/27/2021   PFIZER(Purple Top)SARS-COV-2 Vaccination 11/21/2019, 12/12/2019   Pfizer Covid-19 Vaccine Bivalent Booster 27yr & up 01/22/2022   Pneumococcal Conjugate-13 07/22/2016   Pneumococcal Polysaccharide-23 12/04/2012, 02/06/2018   Tdap 10/04/2011, 03/23/2021   Zoster Recombinat (Shingrix) 09/27/2018, 01/05/2019    TDAP status: Up to date  Flu Vaccine status: Due, Education has been provided regarding the importance of this vaccine. Advised may receive this  vaccine at local pharmacy or Health Dept. Aware to provide a copy of the vaccination record if obtained from local pharmacy or Health Dept. Verbalized acceptance and understanding.  Pneumococcal vaccine status: Up to date  Covid-19 vaccine status: Information provided on how to obtain vaccines.   Qualifies for Shingles Vaccine? Yes   Zostavax completed No   Shingrix Completed?: Yes  Screening Tests Health Maintenance  Topic Date Due   Medicare Annual Wellness (AWV)  Never done   FOOT EXAM  Never done   Diabetic kidney evaluation - Urine ACR  Never done   COLON CANCER SCREENING ANNUAL FOBT  04/08/2017   INFLUENZA VACCINE  06/01/2022   COVID-19 Vaccine (5 - Pfizer risk series) 10/29/2022 (Originally 03/19/2022)   HEMOGLOBIN A1C  02/19/2023   OPHTHALMOLOGY EXAM  05/12/2023   Diabetic kidney evaluation - GFR measurement  08/21/2023   MAMMOGRAM  01/12/2024   COLONOSCOPY (Pts 45-439yrInsurance coverage will need to be confirmed)  04/08/2026   TETANUS/TDAP  03/24/2031   Pneumonia Vaccine 6568Years old  Completed   DEXA SCAN  Completed   Hepatitis C Screening  Completed   Zoster Vaccines- Shingrix  Completed   HPV VACCINES  Aged Out    Health Maintenance  Health Maintenance Due  Topic Date Due  Medicare Annual Wellness (AWV)  Never done   FOOT EXAM  Never done   Diabetic kidney evaluation - Urine ACR  Never done   COLON CANCER SCREENING ANNUAL FOBT  04/08/2017   INFLUENZA VACCINE  06/01/2022    Colorectal cancer screening: Type of screening: Colonoscopy. Completed 04/08/16. Repeat every 10 years  Mammogram status: Completed 01/11/22. Repeat every year  Bone Density status: Completed 01/01/22. Results reflect: Bone density results: OSTEOPENIA. Repeat every 2 years.  Lung Cancer Screening: (Low Dose CT Chest recommended if Age 24-80 years, 30 pack-year currently smoking OR have quit w/in 15years.) does not qualify.   Lung Cancer Screening Referral: N/a  Additional  Screening:  Hepatitis C Screening: does qualify; Completed 07/21/17  Vision Screening: Recommended annual ophthalmology exams for early detection of glaucoma and other disorders of the eye. Is the patient up to date with their annual eye exam?  Yes  Who is the provider or what is the name of the office in which the patient attends annual eye exams? Dr. Satira Sark If pt is not established with a provider, would they like to be referred to a provider to establish care? No .   Dental Screening: Recommended annual dental exams for proper oral hygiene  Community Resource Referral / Chronic Care Management: CRR required this visit?  No   CCM required this visit?  No      Plan:     I have personally reviewed and noted the following in the patient's chart:   Medical and social history Use of alcohol, tobacco or illicit drugs  Current medications and supplements including opioid prescriptions. Patient is not currently taking opioid prescriptions. Functional ability and status Nutritional status Physical activity Advanced directives List of other physicians Hospitalizations, surgeries, and ER visits in previous 12 months Vitals Screenings to include cognitive, depression, and falls Referrals and appointments  In addition, I have reviewed and discussed with patient certain preventive protocols, quality metrics, and best practice recommendations. A written personalized care plan for preventive services as well as general preventive health recommendations were provided to patient.   Due to this being a telephonic visit, the after visit summary with patients personalized plan was offered to patient via mail or my-chart. Patient would like to access on my-chart.  Beatris Ship, [place]   08/25/2022   Nurse Notes: None

## 2022-09-07 ENCOUNTER — Encounter: Payer: Self-pay | Admitting: Family Medicine

## 2022-09-07 ENCOUNTER — Other Ambulatory Visit: Payer: Self-pay | Admitting: Family Medicine

## 2022-09-07 ENCOUNTER — Other Ambulatory Visit (HOSPITAL_BASED_OUTPATIENT_CLINIC_OR_DEPARTMENT_OTHER): Payer: Self-pay

## 2022-09-07 MED ORDER — AMLODIPINE BESYLATE 5 MG PO TABS
5.0000 mg | ORAL_TABLET | Freq: Every day | ORAL | 0 refills | Status: DC
  Filled 2022-09-07: qty 90, 90d supply, fill #0

## 2022-09-08 ENCOUNTER — Other Ambulatory Visit (HOSPITAL_BASED_OUTPATIENT_CLINIC_OR_DEPARTMENT_OTHER): Payer: Self-pay

## 2022-09-08 MED ORDER — LATANOPROST 0.005 % OP SOLN
1.0000 [drp] | Freq: Every day | OPHTHALMIC | 4 refills | Status: DC
  Filled 2022-09-08: qty 7.5, 75d supply, fill #0

## 2022-09-13 ENCOUNTER — Other Ambulatory Visit (HOSPITAL_BASED_OUTPATIENT_CLINIC_OR_DEPARTMENT_OTHER): Payer: Self-pay

## 2022-09-13 ENCOUNTER — Other Ambulatory Visit: Payer: Self-pay | Admitting: Family Medicine

## 2022-09-13 MED ORDER — VITAMIN D3 25 MCG (1000 UNIT) PO TABS
ORAL_TABLET | Freq: Every day | ORAL | 1 refills | Status: AC
  Filled 2022-09-13: qty 200, 200d supply, fill #0

## 2022-09-13 MED ORDER — BLACK PEPPER-TURMERIC 3-500 MG PO CAPS
ORAL_CAPSULE | ORAL | 1 refills | Status: DC
  Filled 2022-09-13: qty 360, 180d supply, fill #0

## 2022-09-14 ENCOUNTER — Other Ambulatory Visit (HOSPITAL_BASED_OUTPATIENT_CLINIC_OR_DEPARTMENT_OTHER): Payer: Self-pay

## 2022-09-14 MED ORDER — FLUAD QUADRIVALENT 0.5 ML IM PRSY
PREFILLED_SYRINGE | INTRAMUSCULAR | 0 refills | Status: DC
  Filled 2022-09-14: qty 0.5, 1d supply, fill #0

## 2022-09-14 MED ORDER — COVID-19 MRNA VAC-TRIS(PFIZER) 30 MCG/0.3ML IM SUSY
0.3000 mL | PREFILLED_SYRINGE | Freq: Once | INTRAMUSCULAR | 0 refills | Status: AC
  Filled 2022-09-14: qty 0.3, 1d supply, fill #0

## 2022-09-15 ENCOUNTER — Other Ambulatory Visit (HOSPITAL_BASED_OUTPATIENT_CLINIC_OR_DEPARTMENT_OTHER): Payer: Self-pay

## 2022-09-15 DIAGNOSIS — H401113 Primary open-angle glaucoma, right eye, severe stage: Secondary | ICD-10-CM | POA: Diagnosis not present

## 2022-10-03 ENCOUNTER — Other Ambulatory Visit: Payer: Self-pay | Admitting: Family Medicine

## 2022-10-04 ENCOUNTER — Other Ambulatory Visit (HOSPITAL_BASED_OUTPATIENT_CLINIC_OR_DEPARTMENT_OTHER): Payer: Self-pay

## 2022-10-04 MED ORDER — METFORMIN HCL 500 MG PO TABS
500.0000 mg | ORAL_TABLET | Freq: Two times a day (BID) | ORAL | 1 refills | Status: DC
  Filled 2022-10-04: qty 180, 90d supply, fill #0
  Filled 2022-12-29: qty 180, 90d supply, fill #1

## 2022-10-04 MED ORDER — ROSUVASTATIN CALCIUM 40 MG PO TABS
40.0000 mg | ORAL_TABLET | Freq: Every day | ORAL | 1 refills | Status: DC
  Filled 2022-10-04: qty 90, 90d supply, fill #0
  Filled 2022-12-29: qty 90, 90d supply, fill #1

## 2022-10-04 MED ORDER — CALCIUM CARB-CHOLECALCIFEROL 600-10 MG-MCG PO TABS
1.0000 | ORAL_TABLET | Freq: Two times a day (BID) | ORAL | 1 refills | Status: DC
  Filled 2022-10-04: qty 150, 75d supply, fill #0
  Filled 2023-06-02: qty 150, 75d supply, fill #1

## 2022-10-05 DIAGNOSIS — H401113 Primary open-angle glaucoma, right eye, severe stage: Secondary | ICD-10-CM | POA: Diagnosis not present

## 2022-10-06 ENCOUNTER — Other Ambulatory Visit (HOSPITAL_BASED_OUTPATIENT_CLINIC_OR_DEPARTMENT_OTHER): Payer: Self-pay

## 2022-10-11 ENCOUNTER — Other Ambulatory Visit (HOSPITAL_BASED_OUTPATIENT_CLINIC_OR_DEPARTMENT_OTHER): Payer: Self-pay

## 2022-10-11 MED ORDER — AREXVY 120 MCG/0.5ML IM SUSR
INTRAMUSCULAR | 0 refills | Status: DC
  Filled 2022-10-11: qty 0.5, 1d supply, fill #0

## 2022-10-19 ENCOUNTER — Other Ambulatory Visit (HOSPITAL_COMMUNITY): Payer: Self-pay

## 2022-10-21 ENCOUNTER — Other Ambulatory Visit: Payer: Self-pay

## 2022-11-03 ENCOUNTER — Encounter: Payer: Self-pay | Admitting: Family Medicine

## 2022-11-16 DIAGNOSIS — H401122 Primary open-angle glaucoma, left eye, moderate stage: Secondary | ICD-10-CM | POA: Diagnosis not present

## 2022-12-03 ENCOUNTER — Other Ambulatory Visit (INDEPENDENT_AMBULATORY_CARE_PROVIDER_SITE_OTHER): Payer: PPO

## 2022-12-03 DIAGNOSIS — R030 Elevated blood-pressure reading, without diagnosis of hypertension: Secondary | ICD-10-CM | POA: Diagnosis not present

## 2022-12-03 DIAGNOSIS — E119 Type 2 diabetes mellitus without complications: Secondary | ICD-10-CM | POA: Diagnosis not present

## 2022-12-03 DIAGNOSIS — E782 Mixed hyperlipidemia: Secondary | ICD-10-CM | POA: Diagnosis not present

## 2022-12-03 DIAGNOSIS — M8580 Other specified disorders of bone density and structure, unspecified site: Secondary | ICD-10-CM | POA: Diagnosis not present

## 2022-12-03 LAB — COMPREHENSIVE METABOLIC PANEL
ALT: 24 U/L (ref 0–35)
AST: 20 U/L (ref 0–37)
Albumin: 4.6 g/dL (ref 3.5–5.2)
Alkaline Phosphatase: 60 U/L (ref 39–117)
BUN: 20 mg/dL (ref 6–23)
CO2: 29 mEq/L (ref 19–32)
Calcium: 9.7 mg/dL (ref 8.4–10.5)
Chloride: 104 mEq/L (ref 96–112)
Creatinine, Ser: 0.79 mg/dL (ref 0.40–1.20)
GFR: 75.18 mL/min (ref >60.00)
Glucose, Bld: 150 mg/dL — ABNORMAL HIGH (ref 70–99)
Potassium: 4.1 mEq/L (ref 3.5–5.1)
Sodium: 140 mEq/L (ref 135–145)
Total Bilirubin: 1.3 mg/dL — ABNORMAL HIGH (ref 0.2–1.2)
Total Protein: 6.8 g/dL (ref 6.0–8.3)

## 2022-12-03 LAB — CBC
HCT: 41.1 % (ref 36.0–46.0)
Hemoglobin: 13.9 g/dL (ref 12.0–15.0)
MCHC: 33.8 g/dL (ref 30.0–36.0)
MCV: 86.1 fl (ref 78.0–100.0)
Platelets: 281 10*3/uL (ref 150.0–400.0)
RBC: 4.78 Mil/uL (ref 3.87–5.11)
RDW: 13.5 % (ref 11.5–15.5)
WBC: 7.1 10*3/uL (ref 4.0–10.5)

## 2022-12-03 LAB — TSH: TSH: 1.63 u[IU]/mL (ref 0.35–5.50)

## 2022-12-03 LAB — HEMOGLOBIN A1C: Hgb A1c MFr Bld: 8.4 % — ABNORMAL HIGH (ref 4.6–6.5)

## 2022-12-03 LAB — MICROALBUMIN / CREATININE URINE RATIO
Creatinine,U: 184.7 mg/dL
Microalb Creat Ratio: 1.1 mg/g (ref 0.0–30.0)
Microalb, Ur: 1.9 mg/dL (ref 0.0–1.9)

## 2022-12-03 LAB — LIPID PANEL
Cholesterol: 127 mg/dL (ref 0–200)
HDL: 50.9 mg/dL (ref >39.00)
LDL Cholesterol: 54 mg/dL (ref 0–99)
NonHDL: 76.5
Total CHOL/HDL Ratio: 3
Triglycerides: 111 mg/dL (ref 0.0–149.0)
VLDL: 22.2 mg/dL (ref 0.0–40.0)

## 2022-12-05 NOTE — Assessment & Plan Note (Signed)
Well controlled, no changes to meds. Encouraged heart healthy diet such as the DASH diet and exercise as tolerated.  

## 2022-12-05 NOTE — Assessment & Plan Note (Signed)
Encouraged to get adequate exercise, calcium and vitamin d intake 

## 2022-12-05 NOTE — Assessment & Plan Note (Signed)
Tolerating statin, encouraged heart healthy diet, avoid trans fats, minimize simple carbs and saturated fats. Increase exercise as tolerated 

## 2022-12-05 NOTE — Assessment & Plan Note (Addendum)
hgba1c elevated, minimize simple carbs. Increase exercise as tolerated. Continue current meds and add Ozempic at 0.25 mg dose and titrate up as needed and as tolerated.

## 2022-12-06 ENCOUNTER — Other Ambulatory Visit (HOSPITAL_BASED_OUTPATIENT_CLINIC_OR_DEPARTMENT_OTHER): Payer: Self-pay

## 2022-12-06 ENCOUNTER — Ambulatory Visit (HOSPITAL_BASED_OUTPATIENT_CLINIC_OR_DEPARTMENT_OTHER)
Admission: RE | Admit: 2022-12-06 | Discharge: 2022-12-06 | Disposition: A | Discharge status: Home / Self Care | Payer: PPO | Source: Ambulatory Visit | Attending: Family Medicine | Admitting: Family Medicine

## 2022-12-06 ENCOUNTER — Other Ambulatory Visit: Payer: Self-pay

## 2022-12-06 ENCOUNTER — Ambulatory Visit (INDEPENDENT_AMBULATORY_CARE_PROVIDER_SITE_OTHER): Payer: PPO | Admitting: Family Medicine

## 2022-12-06 VITALS — BP 124/72 | HR 75 | Temp 97.5°F | Resp 16 | Ht 62.0 in | Wt 162.0 lb

## 2022-12-06 DIAGNOSIS — I10 Essential (primary) hypertension: Secondary | ICD-10-CM | POA: Diagnosis not present

## 2022-12-06 DIAGNOSIS — E782 Mixed hyperlipidemia: Secondary | ICD-10-CM

## 2022-12-06 DIAGNOSIS — M79645 Pain in left finger(s): Secondary | ICD-10-CM

## 2022-12-06 DIAGNOSIS — R030 Elevated blood-pressure reading, without diagnosis of hypertension: Secondary | ICD-10-CM | POA: Diagnosis not present

## 2022-12-06 DIAGNOSIS — M8580 Other specified disorders of bone density and structure, unspecified site: Secondary | ICD-10-CM | POA: Diagnosis not present

## 2022-12-06 DIAGNOSIS — S62647A Nondisplaced fracture of proximal phalanx of left little finger, initial encounter for closed fracture: Secondary | ICD-10-CM | POA: Diagnosis not present

## 2022-12-06 DIAGNOSIS — E119 Type 2 diabetes mellitus without complications: Secondary | ICD-10-CM

## 2022-12-06 MED ORDER — OZEMPIC (0.25 OR 0.5 MG/DOSE) 2 MG/3ML ~~LOC~~ SOPN
0.2500 mg | PEN_INJECTOR | SUBCUTANEOUS | 1 refills | Status: DC
  Filled 2022-12-06 – 2022-12-09 (×2): qty 3, 28d supply, fill #0
  Filled 2022-12-10: qty 3, 56d supply, fill #0
  Filled 2022-12-10: qty 3, 28d supply, fill #0
  Filled 2023-02-09: qty 3, 56d supply, fill #1

## 2022-12-06 MED ORDER — AMLODIPINE BESYLATE 5 MG PO TABS
5.0000 mg | ORAL_TABLET | Freq: Every day | ORAL | 1 refills | Status: DC
  Filled 2022-12-06: qty 90, 90d supply, fill #0
  Filled 2023-03-01: qty 30, 30d supply, fill #1
  Filled 2023-03-31: qty 30, 30d supply, fill #2

## 2022-12-06 MED ORDER — SERTRALINE HCL 50 MG PO TABS
50.0000 mg | ORAL_TABLET | Freq: Every day | ORAL | 3 refills | Status: DC
  Filled 2022-12-06: qty 30, 30d supply, fill #0
  Filled 2022-12-29: qty 30, 30d supply, fill #1
  Filled 2023-02-01: qty 30, 30d supply, fill #2
  Filled 2023-03-01: qty 30, 30d supply, fill #3

## 2022-12-06 NOTE — Patient Instructions (Signed)

## 2022-12-06 NOTE — Progress Notes (Signed)
Subjective:   By signing my name below, I, Madelin Rear, attest that this documentation has been prepared under the direction and in the presence of Mosie Lukes., MD. 12/06/2022.     Patient ID: Priscilla Butler, female    DOB: Jan 13, 1951, 72 y.o.   MRN: [id number]  Chief Complaint  Patient presents with   Follow-up    Follow up    HPI Patient is in today for an office visit.  She is requesting a refill of her 90-day amlodipine 5 mg.  Lately she has been under stress/anxiety assisting with care for her mother. At times she feels a lack of motivation to complete her daily activities. She believes her symptoms are also attributable to feeling more sad, and possibly related to the Winter season. We reviewed medication options for stress and anxiety management in detail.   In November before Thanksgiving, while walking up a stairwell, she struck her left hand on the wall and injured her left 5th finger. She believes that she may have a fracture. Her left 5th finger has not yet healed.   We also discussed her latest blood work. We will repeat labs in 3 months. Lab Results  Component Value Date   HGBA1C 8.4 (H) 12/03/2022   She is UTD on all of her vaccinations.   Reports no recent febrile illness or hospitalizations. Denies CP/ palp/ SOB/ HA/ congestion/fevers/GI or GU c/o. Taking meds as prescribed.    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   DeQuervain's disease (tenosynovitis)    DM (diabetes mellitus), type 2 ([place])    H/O measles    H/O mumps    History of chicken pox    Hx of TB skin testing    Hyperglycemia    Hyperlipidemia    Menopause    Osteopenia    Otitis externa of left ear 01/30/2017   Preventative health care 01/19/2016   Welcome to Medicare preventive visit 01/27/2017    Past Surgical History:  Procedure Laterality Date   BREAST LUMPECTOMY  03/2008    Right breast   MOHS SURGERY  2013   [facility] right upper lip, [place] skin surgery   SKIN SURGERY     facial  skin cancer   WISDOM TOOTH EXTRACTION  1972    Family History  Problem Relation Age of Onset   Breast cancer Maternal Grandmother    Cancer Maternal Grandmother 37       ish   Cancer Maternal Grandfather        lung   Hypertension Mother    Hyperlipidemia Mother    Arthritis Mother    Other Paternal Grandmother     Social History   Socioeconomic History   Marital status: Married    Spouse name: [name]   Number of children: 1   Years of education: BA   Highest education level: Not on file  Occupational History   Occupation: Retired    Fish farm manager: UNEMPLOYED  Tobacco Use   Smoking status: Former    Types: Cigarettes    Quit date: 11/02/1999    Years since quitting: 23.1   Smokeless tobacco: Never  Substance and Sexual Activity   Alcohol use: Yes    Alcohol/week: 3.0 standard drinks of alcohol    Types: 3 Cans of beer per week   Drug use: No   Sexual activity: Yes  Partners: Male    Birth control/protection: Post-menopausal    Comment: lives with husband, retired from Press photographer, Therapist, music, No dietary restrictions  Other Topics Concern   Not on file  Social History Narrative   Not on file   Social Determinants of Health   Financial Resource Strain: Low Risk  (08/25/2022)   Overall Financial Resource Strain (CARDIA)    Difficulty of Paying Living Expenses: Not hard at all  Food Insecurity: No Food Insecurity (08/25/2022)   Hunger Vital Sign    Worried About Running Out of Food in the Last Year: Never true    [place] in the Last Year: Never true  Transportation Needs: No Transportation Needs (08/25/2022)   PRAPARE - Hydrologist (Medical): No    Lack of Transportation (Non-Medical): No  Physical Activity: Insufficiently Active (08/25/2022)   Exercise Vital Sign    Days of Exercise per Week: 4 days    Minutes  of Exercise per Session: 30 min  Stress: Stress Concern Present (08/25/2022)   [place]    Feeling of Stress : To some extent  Social Connections: Moderately Integrated (08/25/2022)   Social Connection and Isolation Panel [NHANES]    Frequency of Communication with Friends and Family: Once a week    Frequency of Social Gatherings with Friends and Family: More than three times a week    Attends Religious Services: Never    Marine scientist or Organizations: Yes    Attends Archivist Meetings: Never    Marital Status: Married  Human resources officer Violence: Not At Risk (08/25/2022)   Humiliation, Afraid, Rape, and Kick questionnaire    Fear of Current or Ex-Partner: No    Emotionally Abused: No    Physically Abused: No    Sexually Abused: No    Outpatient Medications Prior to Visit  Medication Sig Dispense Refill   aspirin EC 81 MG tablet TAKE 1 TABLET (81 MG TOTAL) BY MOUTH DAILY. 120 tablet 3   Black Pepper-Turmeric (TURMERIC COMPLEX/BLACK PEPPER) 3-500 MG CAPS TAKE 1 CAPSULE BY MOUTH 2 (TWO) TIMES DAILY. 180 capsule 1   Calcium Carb-Cholecalciferol 600-10 MG-MCG TABS Take 1 tablet by mouth 2 (two) times daily. 180 tablet 1   cholecalciferol (VITAMIN D3) 25 MCG (1000 UNIT) tablet TAKE 1 TABLET BY MOUTH ONCE DAILY 100 tablet 1   CRANBERRY PO Take 84 mg by mouth daily.     Fluocinolone Acetonide 0.01 % OIL Place 1 drop into the ear as directed 2 times daily as needed for itching 20 mL 1   glucose blood (ONETOUCH ULTRA) test strip USE TO TEST BLOOD SUGAR TWICE A DAY AS DIRECTED 100 strip 12   metFORMIN (GLUCOPHAGE) 500 MG tablet Take 1 tablet (500 mg total) by mouth 2 (two) times daily with a meal. 180 tablet 1   Multiple Vitamin (MULTIVITAMIN) tablet Take 1 tablet by mouth daily.     Omega-3 Fatty Acids (FISH OIL) 1000 MG CAPS Take 1 capsule by mouth 2 (two) times daily.     Probiotic Product (PROBIOTIC  DAILY) CAPS Take 1 capsule by mouth daily. NOW company     Psyllium (METAMUCIL SMOOTH TEXTURE) 28.3 % POWD 1 tsp daily in water     rosuvastatin (CRESTOR) 40 MG tablet Take 1 tablet (40 mg total) by mouth daily. 90 tablet 1   amLODipine (NORVASC) 5 MG tablet Take 1 tablet (5 mg total) by mouth  daily. 90 tablet 0   dorzolamide-timolol (COSOPT) 2-0.5 % ophthalmic solution Place 1 drop into both eyes 2 (two) times daily. 10 mL 12   influenza vaccine adjuvanted (FLUAD QUADRIVALENT) 0.5 ML injection Inject into the muscle. 0.5 mL 0   influenza vaccine adjuvanted (FLUAD) 0.5 ML injection Inject into the muscle. 0.5 mL 0   latanoprost (XALATAN) 0.005 % ophthalmic solution PLACE 1 DROP INTO BOTH EYES AT BEDTIME 10 mL 4   RSV vaccine recomb adjuvanted (AREXVY) 120 MCG/0.5ML injection Inject into the muscle. 0.5 mL 0   latanoprost (XALATAN) 0.005 % ophthalmic solution Place 1 drop into both eyes at bedtime. (Patient not taking: Reported on 12/06/2022) 10 mL 4   No facility-administered medications prior to visit.    Allergies  Allergen Reactions   Lisinopril Cough    Review of Systems  Constitutional:  Negative for fever.  HENT:  Negative for congestion, sinus pain and sore throat.   Respiratory:  Negative for cough, shortness of breath and wheezing.   Cardiovascular:  Negative for chest pain and palpitations.  Gastrointestinal:  Negative for blood in stool, constipation, diarrhea, nausea and vomiting.  Genitourinary:  Negative for dysuria, frequency and hematuria.  Musculoskeletal:  Negative for joint pain and myalgias.  Psychiatric/Behavioral:  The patient is nervous/anxious ([place]).        Objective:    Physical Exam Constitutional:      Appearance: Normal appearance.  HENT:     Head: Normocephalic and atraumatic.     Right Ear: Tympanic membrane, ear canal and external ear normal.     Left Ear: Tympanic membrane, ear canal and external ear normal.  Eyes:     Extraocular Movements:  Extraocular movements intact.     Pupils: Pupils are equal, round, and reactive to light.  Cardiovascular:     Rate and Rhythm: Normal rate and regular rhythm.     Heart sounds: Normal heart sounds. No murmur heard.    No gallop.  Pulmonary:     Effort: Pulmonary effort is normal. No respiratory distress.     Breath sounds: Normal breath sounds. No wheezing or rales.  Abdominal:     General: Bowel sounds are normal. There is no distension.     Palpations: Abdomen is soft.     Tenderness: There is no abdominal tenderness. There is no guarding.  Musculoskeletal:        General: Normal range of motion.     Comments: Lesion/deformity of left 5th finger.  Skin:    General: Skin is warm and dry.  Neurological:     General: No focal deficit present.     Mental Status: She is alert and oriented to person, place, and time.  Psychiatric:        Mood and Affect: Mood normal.        Behavior: Behavior normal.     BP 124/72 (BP Location: Right Arm, Patient Position: Sitting, Cuff Size: Normal)   Pulse 75   Temp (!) 97.5 F (36.4 C) (Oral)   Resp 16   Ht 5' 2"$  (1.575 m)   Wt 162 lb (73.5 kg)   SpO2 98%   BMI 29.63 kg/m  Wt Readings from Last 3 Encounters:  12/06/22 162 lb (73.5 kg)  08/23/22 159 lb 3.2 oz (72.2 kg)  02/18/22 161 lb 9.6 oz (73.3 kg)    Diabetic Foot Exam - Simple   No data filed    Lab Results  Component Value Date   WBC 7.1 12/03/2022  HGB 13.9 12/03/2022   HCT 41.1 12/03/2022   PLT 281.0 12/03/2022   GLUCOSE 150 (H) 12/03/2022   CHOL 127 12/03/2022   TRIG 111.0 12/03/2022   HDL 50.90 12/03/2022   LDLCALC 54 12/03/2022   ALT 24 12/03/2022   AST 20 12/03/2022   NA 140 12/03/2022   K 4.1 12/03/2022   CL 104 12/03/2022   CREATININE 0.79 12/03/2022   BUN 20 12/03/2022   CO2 29 12/03/2022   TSH 1.63 12/03/2022   HGBA1C 8.4 (H) 12/03/2022   MICROALBUR 1.9 12/03/2022    Lab Results  Component Value Date   TSH 1.63 12/03/2022   Lab Results   Component Value Date   WBC 7.1 12/03/2022   HGB 13.9 12/03/2022   HCT 41.1 12/03/2022   MCV 86.1 12/03/2022   PLT 281.0 12/03/2022   Lab Results  Component Value Date   NA 140 12/03/2022   K 4.1 12/03/2022   CO2 29 12/03/2022   GLUCOSE 150 (H) 12/03/2022   BUN 20 12/03/2022   CREATININE 0.79 12/03/2022   BILITOT 1.3 (H) 12/03/2022   ALKPHOS 60 12/03/2022   AST 20 12/03/2022   ALT 24 12/03/2022   PROT 6.8 12/03/2022   ALBUMIN 4.6 12/03/2022   CALCIUM 9.7 12/03/2022   GFR 75.18 12/03/2022   Lab Results  Component Value Date   CHOL 127 12/03/2022   Lab Results  Component Value Date   HDL 50.90 12/03/2022   Lab Results  Component Value Date   LDLCALC 54 12/03/2022   Lab Results  Component Value Date   TRIG 111.0 12/03/2022   Lab Results  Component Value Date   CHOLHDL 3 12/03/2022   Lab Results  Component Value Date   HGBA1C 8.4 (H) 12/03/2022       Assessment & Plan:   Problem List Items Addressed This Visit     DM (diabetes mellitus), type 2 ([place]) - Primary    hgba1c elevated, minimize simple carbs. Increase exercise as tolerated. Continue current meds and add Ozempic at 0.25 mg dose and titrate up as needed and as tolerated.       Relevant Medications   Semaglutide,0.25 or 0.5MG/DOS, (OZEMPIC, 0.25 OR 0.5 MG/DOSE,) 2 MG/3ML SOPN   Other Relevant Orders   HgB A1c   TSH   Finger pain, left    After injury pain has not resolved. Xray confirms fracture referred to ortho for evaluation and management      Relevant Orders   DG Finger Little Left (Completed)   HTN (hypertension)    Well controlled, no changes to meds. Encouraged heart healthy diet such as the DASH diet and exercise as tolerated.        Relevant Medications   amLODipine (NORVASC) 5 MG tablet   Hyperlipidemia    Tolerating statin, encouraged heart healthy diet, avoid trans fats, minimize simple carbs and saturated fats. Increase exercise as tolerated      Relevant Medications    amLODipine (NORVASC) 5 MG tablet   Other Relevant Orders   CBC w/Diff   Lipid panel   Microalbumin, urine   Comp Met (CMET)   Osteopenia    Encouraged to get adequate exercise, calcium and vitamin d intake       Other Visit Diagnoses     Elevated blood pressure reading       Relevant Orders   TSH        Meds ordered this encounter  Medications   sertraline (ZOLOFT) 50 MG tablet  Sig: Take 1 tablet (50 mg total) by mouth daily.    Dispense:  30 tablet    Refill:  3   Semaglutide,0.25 or 0.5MG/DOS, (OZEMPIC, 0.25 OR 0.5 MG/DOSE,) 2 MG/3ML SOPN    Sig: Inject 0.25 mg into the skin once a week.    Dispense:  3 mL    Refill:  1   amLODipine (NORVASC) 5 MG tablet    Sig: Take 1 tablet (5 mg total) by mouth daily.    Dispense:  90 tablet    Refill:  1    I, Penni Homans, MD, personally preformed the services described in this documentation.  All medical record entries made by the scribe were at my direction and in my presence.  I have reviewed the chart and discharge instructions (if applicable) and agree that the record reflects my personal performance and is accurate and complete. 12/06/2022  I,Mathew Stumpf,acting as a scribe for Penni Homans, MD.,have documented all relevant documentation on the behalf of Penni Homans, MD,as directed by  Penni Homans, MD while in the presence of Penni Homans, MD.   Penni Homans, MD

## 2022-12-07 ENCOUNTER — Encounter: Payer: Self-pay | Admitting: Family Medicine

## 2022-12-08 ENCOUNTER — Other Ambulatory Visit: Payer: Self-pay | Admitting: Family Medicine

## 2022-12-08 DIAGNOSIS — S62615G Displaced fracture of proximal phalanx of left ring finger, subsequent encounter for fracture with delayed healing: Secondary | ICD-10-CM

## 2022-12-09 ENCOUNTER — Other Ambulatory Visit: Payer: Self-pay

## 2022-12-09 ENCOUNTER — Telehealth: Payer: Self-pay

## 2022-12-09 NOTE — Telephone Encounter (Signed)
PA initiated via Covermymeds; KEY: [id number]. Awaiting determination.

## 2022-12-10 ENCOUNTER — Other Ambulatory Visit (HOSPITAL_COMMUNITY): Payer: Self-pay

## 2022-12-10 ENCOUNTER — Other Ambulatory Visit: Payer: Self-pay

## 2022-12-10 ENCOUNTER — Other Ambulatory Visit (HOSPITAL_BASED_OUTPATIENT_CLINIC_OR_DEPARTMENT_OTHER): Payer: Self-pay

## 2022-12-10 MED ORDER — DORZOLAMIDE HCL-TIMOLOL MAL 2-0.5 % OP SOLN
1.0000 [drp] | Freq: Two times a day (BID) | OPHTHALMIC | 3 refills | Status: DC
  Filled 2022-12-10: qty 20, 100d supply, fill #0
  Filled 2022-12-10: qty 10, 100d supply, fill #0
  Filled 2023-03-14: qty 20, 100d supply, fill #1
  Filled 2023-06-14: qty 20, 100d supply, fill #2
  Filled 2023-09-05: qty 20, 100d supply, fill #3

## 2022-12-10 NOTE — Telephone Encounter (Signed)
PA approved. Effective 12/09/22 to 12/10/23.

## 2022-12-12 DIAGNOSIS — M79645 Pain in left finger(s): Secondary | ICD-10-CM | POA: Insufficient documentation

## 2022-12-12 NOTE — Assessment & Plan Note (Signed)
After injury pain has not resolved. Xray confirms fracture referred to ortho for evaluation and management

## 2022-12-13 ENCOUNTER — Other Ambulatory Visit (HOSPITAL_COMMUNITY): Payer: Self-pay

## 2022-12-13 ENCOUNTER — Other Ambulatory Visit: Payer: Self-pay

## 2022-12-13 MED ORDER — LATANOPROST 0.005 % OP SOLN
1.0000 [drp] | Freq: Every day | OPHTHALMIC | 3 refills | Status: DC
  Filled 2022-12-13: qty 10, 100d supply, fill #0
  Filled 2023-04-12: qty 10, 100d supply, fill #1
  Filled 2023-07-11: qty 10, 100d supply, fill #2
  Filled 2023-11-16: qty 10, 100d supply, fill #3

## 2022-12-21 ENCOUNTER — Ambulatory Visit: Payer: PPO | Admitting: Orthopaedic Surgery

## 2022-12-21 ENCOUNTER — Other Ambulatory Visit (HOSPITAL_COMMUNITY): Payer: Self-pay

## 2022-12-21 DIAGNOSIS — M79645 Pain in left finger(s): Secondary | ICD-10-CM | POA: Diagnosis not present

## 2022-12-21 NOTE — Progress Notes (Signed)
Office Visit Note   Patient: Priscilla Butler           Date of Birth: 1951-01-11           MRN: [id number] Visit Date: 12/21/2022              Requested by: Mosie Lukes, MD [address] [place],  [place] [id number] PCP: Mosie Lukes, MD   Assessment & Plan: Visit Diagnoses:  1. Pain in left finger(s)     Plan: Impression is healed left small finger proximal phalanx fracture.  At this point, the majority of her problems stem from PIP stiffness and swelling due to underlying OA.  We discussed starting her in hand therapy at this time.  Referral has been made.  She will follow-up as needed.  Follow-Up Instructions: Return if symptoms worsen or fail to improve.   Orders:  Orders Placed This Encounter  Procedures   Ambulatory referral to Occupational Therapy   No orders of the defined types were placed in this encounter.     Procedures: No procedures performed   Clinical Data: No additional findings.   Subjective: Chief Complaint  Patient presents with   Left Hand - Pain    HPI patient is a pleasant 72 year old right-hand-dominant female who comes in today following a left hand small finger injury.  Injury occurred back in November 2023.  She was at the gym going down a set of stairs with her left hand on the railing she felt like it got caught.  She initially thought she had jammed her finger where she started to buddy tape the small and ring fingers together.  She eventually saw her primary care provider where x-rays were obtained.  X-rays demonstrated proximal phalanx fracture to the small finger.  She is here today for further evaluation and treatment recommendation.  Review of Systems as detailed in HPI.  All others reviewed and are negative.   Objective: Vital Signs: There were no vitals taken for this visit.  Physical Exam well-developed well-nourished female no acute distress.  Alert and oriented x 3.  Ortho Exam left hand small finger exam  shows moderate swelling to the PIP joint.  No tenderness at the fracture site.  She does have slight limitation at the PIP joint secondary to swelling and stiffness.  Collaterals are stable.  She is neurovascularly intact distally.  Specialty Comments:  No specialty comments available.  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[place]) 01/19/2016   HTN (hypertension) 08/16/2015   Neck pain on right side 08/16/2015   Pain in joint of right shoulder 08/16/2015   Acromioclavicular joint arthritis 08/14/2015   Subacromial bursitis 08/14/2015   Degenerative disc disease, cervical 08/14/2015   History of chicken pox    Hx of TB skin testing    Skin cancer, basal cell 12/04/2012   Hyperlipidemia    DM (diabetes mellitus), type 2 ([place])    Menopause    Osteopenia    HX: breast cancer 01/31/2008   Past Medical History:  Diagnosis Date  Breast cancer ([place]) 01/2008   Stage I, right breast   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   DeQuervain's disease (tenosynovitis)    DM (diabetes mellitus), type 2 ([place])    H/O measles    H/O mumps    History of chicken pox    Hx of TB skin testing    Hyperglycemia    Hyperlipidemia    Menopause    Osteopenia    Otitis externa of left ear 01/30/2017   Preventative health care 01/19/2016   Welcome to Medicare preventive visit 01/27/2017    Family History  Problem Relation Age of Onset   Breast cancer Maternal Grandmother    Cancer Maternal Grandmother 74       ish   Cancer Maternal Grandfather        lung   Hypertension  Mother    Hyperlipidemia Mother    Arthritis Mother    Other Paternal Grandmother     Past Surgical History:  Procedure Laterality Date   BREAST LUMPECTOMY  03/2008   Right breast   MOHS SURGERY  2013   [place] right upper lip, [place] skin surgery   SKIN SURGERY     facial  skin cancer   WISDOM TOOTH EXTRACTION  1972   Social History   Occupational History   Occupation: Retired    Fish farm manager: UNEMPLOYED  Tobacco Use   Smoking status: Former    Types: Cigarettes    Quit date: 11/02/1999    Years since quitting: 23.1   Smokeless tobacco: Never  Substance and Sexual Activity   Alcohol use: Yes    Alcohol/week: 3.0 standard drinks of alcohol    Types: 3 Cans of beer per week   Drug use: No   Sexual activity: Yes    Partners: Male    Birth control/protection: Post-menopausal    Comment: lives with husband, retired from Press photographer, Therapist, music, No dietary restrictions

## 2022-12-29 ENCOUNTER — Other Ambulatory Visit (HOSPITAL_COMMUNITY): Payer: Self-pay

## 2023-01-03 ENCOUNTER — Ambulatory Visit (INDEPENDENT_AMBULATORY_CARE_PROVIDER_SITE_OTHER): Payer: PPO | Admitting: Rehabilitative and Restorative Service Providers"

## 2023-01-03 ENCOUNTER — Other Ambulatory Visit: Payer: Self-pay

## 2023-01-03 ENCOUNTER — Encounter: Payer: Self-pay | Admitting: Rehabilitative and Restorative Service Providers"

## 2023-01-03 DIAGNOSIS — M25642 Stiffness of left hand, not elsewhere classified: Secondary | ICD-10-CM | POA: Diagnosis not present

## 2023-01-03 NOTE — Therapy (Signed)
OUTPATIENT OCCUPATIONAL THERAPY ORTHO EVALUATION  Patient Name: Priscilla Butler MRN: [id number] DOB:November 18, 1950, 72 y.o., female Today's Date: 01/03/2023  PCP: Penni Homans MD REFERRING PROVIDER: Leandrew Koyanagi, MD   END OF SESSION:  OT End of Session - 01/03/23 1423     Visit Number 1    Number of Visits 3    Date for OT Re-Evaluation 01/21/23    Authorization Type [employer]    OT Start Time 1425    OT Stop Time 1515    OT Time Calculation (min) 50 min    Activity Tolerance Patient tolerated treatment well;No increased pain    Behavior During Therapy [facility] for tasks assessed/performed             Past Medical History:  Diagnosis Date   Breast cancer ([place]) 01/2008   Stage I, right breast   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   DeQuervain's disease (tenosynovitis)    DM (diabetes mellitus), type 2 ([place])    H/O measles    H/O mumps    History of chicken pox    Hx of TB skin testing    Hyperglycemia    Hyperlipidemia    Menopause    Osteopenia    Otitis externa of left ear 01/30/2017   Preventative health care 01/19/2016   Welcome to Medicare preventive visit 01/27/2017   Past Surgical History:  Procedure Laterality Date   BREAST LUMPECTOMY  03/2008   Right breast   MOHS SURGERY  2013   [place] right upper lip, [place] skin surgery   SKIN SURGERY     facial  skin cancer   WISDOM TOOTH EXTRACTION  1972   Patient Active Problem List   Diagnosis Date Noted   Finger pain, left 12/12/2022   Cyst of subcutaneous tissue 12/14/2020   Cervical cancer screening 12/09/2020   Rash 08/15/2019   Urinary frequency 08/15/2019   Otitis externa of left ear 01/30/2017   Welcome to Medicare preventive visit 01/27/2017   Glaucoma 07/31/2016   Preventative health care 01/19/2016   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 ([place]) 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/2016   HTN (hypertension) 08/16/2015   Neck pain on right side 08/16/2015   Pain in joint of right shoulder 08/16/2015    Acromioclavicular joint arthritis 08/14/2015   Subacromial bursitis 08/14/2015   Degenerative disc disease, cervical 08/14/2015   History of chicken pox    Hx of TB skin testing    Skin cancer, basal cell 12/04/2012   Hyperlipidemia    DM (diabetes mellitus), type 2 ([place])    Menopause    Osteopenia    HX: breast cancer 01/31/2008    ONSET DATE: Mid Nov 2023  REFERRING DIAG: [id number] (ICD-10-CM) - Pain in left finger(s)   THERAPY DIAG:  Stiffness of left hand, not elsewhere classified  Rationale for Evaluation and Treatment: Rehabilitation  SUBJECTIVE:   SUBJECTIVE STATEMENT: She states " jamming" her left small finger sometime in November 2023, and now she cannot make a complete fist.  She initially had pain and swelling, but now she only has some bulkiness at the MCP and PIP joints but no pain.  She states this does not affect her daily function much other than she cannot make a complete fist.    PERTINENT HISTORY: 15+ weeks post fracture now. Per MD: "Eval and treat left small finger stiffness, PIP OA "  "Impression is healed left small finger proximal phalanx fracture.  At this point, the majority of her problems stem from PIP  stiffness and swelling due to underlying OA.  We discussed starting her in hand therapy at this time.  Referral has been made.  She will follow-up as needed. "   PRECAUTIONS: None  WEIGHT BEARING RESTRICTIONS: No  PAIN:  Are you having pain? Not now at rest or in past week  Rating: 0/10 at rest now, up to 0/10 at worst in past week   FALLS: Has patient fallen in last 6 months? No  LIVING ENVIRONMENT: Lives with: lives with their family  PLOF: Independent  PATIENT GOALS: Get Lt hand to make a full fist    OBJECTIVE: (All objective assessments below are from initial evaluation on: 01/03/23 unless otherwise specified.)   HAND DOMINANCE: Right   ADLs: Overall ADLs: States no issues with B/IADLS.   FUNCTIONAL OUTCOME MEASURES: Eval: Quck DASH  0% impairment today  (Higher % Score  =  More Impairment)   UPPER EXTREMITY ROM     Shoulder to Wrist AROM Left eval  Wrist flexion 57 (compared to 57 Rt)   Wrist extension 65 (compared to 59* Rt)   (Blank rows = not tested)   Hand AROM Right eval Left eval  Full Fist Ability (or Gap to Distal Palmar Crease) yes No- SF has 2cm gap   Little MCP (0-90) (+2) -  82 (+5) -  57  Little PIP (0-100) (-10) -  103 (-30) -  86  Little DIP (0-70) 0 -  65  0 - 45  (Blank rows = not tested)   UPPER EXTREMITY MMT:     MMT Left 01/03/23  SF Abduction 4-/5  SF flexion 5/5  SF adduction 4-/5  SF ext  4/5 slight pain  (Blank rows = not tested)  HAND FUNCTION: Eval: Observed weakness in affected hand.  Grip strength Right: 63 lbs, Left: 57 lbs   COORDINATION: Eval: No observed coordination impairments with affected Lt hand.  SENSATION: Eval:  Light touch intact today, though diminished in Lt SF compared to Rt SF    EDEMA:   Eval:  Mildly swollen in Lt SF PIP J volar plate, PIP J itself and notable swelling in volar base of MCP J of SF (may be inflamed VP or annular ligament that was damaged at injury)  COGNITION: Eval: Overall cognitive status: WFL for evaluation today   OBSERVATIONS:   Eval: She has some weakness in small finger adduction and abduction as well as stiffness in all joints and bulkiness at the PIP volar plate and also at the MCP joint volar plate or annular ligament.  No tenderness palpation however.   TODAY'S TREATMENT:  Post-evaluation treatment: She was given the phone doing home exercise program to address the range of motion deficits that were found in her left hand small finger.  They also addressed the weakness that was found.  She was educated to use moist heat for 5 minutes before performing and use ice at the end of her hand was sore.  She should do these things 4 times a day for consistency.  She demonstrates some back show understanding at the end of the  session she can almost make a full fist already.  For her self-care, she was advised to not do anything too heavy or painful in the small finger, and avoid overly repetitive activities as she likely has an arthritis exacerbation in this finger now.  She states understanding everything  Exercises - PUSH KNUCKLES DOWN  - 4 x daily - 3-5 reps - 15 seconds  hold - HOOK Stretch  - 4 x daily - 3-5 reps - 15-20 sec hold - BACK KNUCKLE STRETCHES   - 4 x daily - 3-5 reps - 15 sec hold - Seated Finger Composite Flexion Stretch  - 4 x daily - 3-5 reps - 15 hold - Hand AROM Reverse Blocking  - 4-6 x daily - 10-15 reps - Tendon Glides  - 4-6 x daily - 3-5 reps - 2-3 seconds hold - Spread Index Finger Apart  - 3 x daily - 5 reps - 5 sec hold - Cutting Putty  - 2-3 x daily - 5 reps   PATIENT EDUCATION: Education details: See tx section above for details  Person educated: Patient Education method: Verbal Instruction, Teach back, Handouts  Education comprehension: States and demonstrates understanding, Additional Education required    HOME EXERCISE PROGRAM: Access Code: [email or web address] URL: [email or web address] Date: 01/03/2023 Prepared by: Benito Mccreedy   GOALS: Goals reviewed with patient? Yes   SHORT TERM GOALS: (STG required if POC>30 days) Target Date: 01/21/23  Pt will obtain protective, custom mobilizing orthotic. Goal status: TBD/PRN  2.  Pt will demo/state understanding of initial HEP to improve pain levels and prerequisite motion. Goal status: INITIAL  3.  Pt will improve TAM of Lt SF to have pad of SF touch palm to make complete fist.  Goal status: INITIAL    ASSESSMENT:  CLINICAL IMPRESSION: Patient is a 72 y.o. female who was seen today for occupational therapy evaluation for stiff left hand small finger and inability to make a full fist.  These things do not affect her daily activity significantly so not much therapy is warranted.  OT will help her progress her  hand motion and function, but only 2-3 visits should be needed in total, to potentially create and make an orthotic to help mobilize her small finger if conservative stretching does not help.   PERFORMANCE DEFICITS: in functional skills including dexterity, edema, ROM, strength, fascial restrictions, and flexibility, cognitive skills including problem solving, and psychosocial skills including coping strategies.   IMPAIRMENTS: are limiting patient from  making a full fist which is typically needed with most daily tasks .   COMORBIDITIES: may have co-morbidities  that affects occupational performance. Patient will benefit from skilled OT to address above impairments and improve overall function.  MODIFICATION OR ASSISTANCE TO COMPLETE EVALUATION: No modification of tasks or assist necessary to complete an evaluation.  OT OCCUPATIONAL PROFILE AND HISTORY: Problem focused assessment: Including review of records relating to presenting problem.  CLINICAL DECISION MAKING: LOW - limited treatment options, no task modification necessary  REHAB POTENTIAL: Excellent  EVALUATION COMPLEXITY: Low      PLAN:  OT FREQUENCY & DURATION:   2-3 visits total in 1 month of care through 01/21/23 as needed  PLANNED INTERVENTIONS: self care/ADL training, therapeutic exercise, manual therapy, passive range of motion, splinting, moist heat, cryotherapy, contrast bath, patient/family education, and coping strategies training  RECOMMENDED OTHER SERVICES: none now   CONSULTED AND AGREED WITH PLAN OF CARE: Patient  PLAN FOR NEXT SESSION: Check home exercise program and range of motion, if better discharged to self-care.  If not significantly better OT could make orthotic to help address her stiffness.   Benito Mccreedy, OTR/L, CHT 01/03/2023, 3:29 PM

## 2023-01-10 ENCOUNTER — Encounter: Payer: PPO | Admitting: Rehabilitative and Restorative Service Providers"

## 2023-01-17 DIAGNOSIS — Z1231 Encounter for screening mammogram for malignant neoplasm of breast: Secondary | ICD-10-CM | POA: Diagnosis not present

## 2023-01-17 LAB — HM MAMMOGRAPHY

## 2023-02-01 ENCOUNTER — Other Ambulatory Visit: Payer: Self-pay

## 2023-02-09 ENCOUNTER — Other Ambulatory Visit (HOSPITAL_COMMUNITY): Payer: Self-pay

## 2023-02-16 DIAGNOSIS — H401113 Primary open-angle glaucoma, right eye, severe stage: Secondary | ICD-10-CM | POA: Diagnosis not present

## 2023-03-01 ENCOUNTER — Other Ambulatory Visit: Payer: Self-pay

## 2023-03-08 ENCOUNTER — Encounter: Payer: Self-pay | Admitting: Family Medicine

## 2023-03-14 ENCOUNTER — Other Ambulatory Visit (INDEPENDENT_AMBULATORY_CARE_PROVIDER_SITE_OTHER): Payer: PPO

## 2023-03-14 ENCOUNTER — Other Ambulatory Visit: Payer: Self-pay

## 2023-03-14 DIAGNOSIS — E782 Mixed hyperlipidemia: Secondary | ICD-10-CM | POA: Diagnosis not present

## 2023-03-14 DIAGNOSIS — R030 Elevated blood-pressure reading, without diagnosis of hypertension: Secondary | ICD-10-CM | POA: Diagnosis not present

## 2023-03-14 DIAGNOSIS — E119 Type 2 diabetes mellitus without complications: Secondary | ICD-10-CM

## 2023-03-14 LAB — CBC WITH DIFFERENTIAL/PLATELET
Basophils Absolute: 0 10*3/uL (ref 0.0–0.1)
Basophils Relative: 0.7 % (ref 0.0–3.0)
Eosinophils Absolute: 0.2 10*3/uL (ref 0.0–0.7)
Eosinophils Relative: 2.4 % (ref 0.0–5.0)
HCT: 39.5 % (ref 36.0–46.0)
Hemoglobin: 13.2 g/dL (ref 12.0–15.0)
Lymphocytes Relative: 27.9 % (ref 12.0–46.0)
Lymphs Abs: 1.9 10*3/uL (ref 0.7–4.0)
MCHC: 33.3 g/dL (ref 30.0–36.0)
MCV: 87.2 fl (ref 78.0–100.0)
Monocytes Absolute: 0.7 10*3/uL (ref 0.1–1.0)
Monocytes Relative: 9.4 % (ref 3.0–12.0)
Neutro Abs: 4.1 10*3/uL (ref 1.4–7.7)
Neutrophils Relative %: 59.6 % (ref 43.0–77.0)
Platelets: 268 10*3/uL (ref 150.0–400.0)
RBC: 4.53 Mil/uL (ref 3.87–5.11)
RDW: 13 % (ref 11.5–15.5)
WBC: 7 10*3/uL (ref 4.0–10.5)

## 2023-03-14 LAB — COMPREHENSIVE METABOLIC PANEL
ALT: 19 U/L (ref 0–35)
AST: 17 U/L (ref 0–37)
Albumin: 4.2 g/dL (ref 3.5–5.2)
Alkaline Phosphatase: 56 U/L (ref 39–117)
BUN: 14 mg/dL (ref 6–23)
CO2: 30 mEq/L (ref 19–32)
Calcium: 9.6 mg/dL (ref 8.4–10.5)
Chloride: 104 mEq/L (ref 96–112)
Creatinine, Ser: 0.78 mg/dL (ref 0.40–1.20)
GFR: 76.19 mL/min (ref >60.00)
Glucose, Bld: 129 mg/dL — ABNORMAL HIGH (ref 70–99)
Potassium: 3.9 mEq/L (ref 3.5–5.1)
Sodium: 140 mEq/L (ref 135–145)
Total Bilirubin: 0.8 mg/dL (ref 0.2–1.2)
Total Protein: 6.5 g/dL (ref 6.0–8.3)

## 2023-03-14 LAB — LIPID PANEL
Cholesterol: 132 mg/dL (ref 0–200)
HDL: 54.6 mg/dL (ref >39.00)
LDL Cholesterol: 61 mg/dL (ref 0–99)
NonHDL: 77.8
Total CHOL/HDL Ratio: 2
Triglycerides: 83 mg/dL (ref 0.0–149.0)
VLDL: 16.6 mg/dL (ref 0.0–40.0)

## 2023-03-14 LAB — HEMOGLOBIN A1C: Hgb A1c MFr Bld: 7 % — ABNORMAL HIGH (ref 4.6–6.5)

## 2023-03-14 LAB — TSH: TSH: 1.12 u[IU]/mL (ref 0.35–5.50)

## 2023-03-15 LAB — MICROALBUMIN, URINE: Microalb, Ur: <0.2 mg/dL

## 2023-03-17 ENCOUNTER — Ambulatory Visit: Payer: PPO | Admitting: Family Medicine

## 2023-03-17 ENCOUNTER — Other Ambulatory Visit (HOSPITAL_BASED_OUTPATIENT_CLINIC_OR_DEPARTMENT_OTHER): Payer: Self-pay

## 2023-03-17 ENCOUNTER — Encounter: Payer: Self-pay | Admitting: Family Medicine

## 2023-03-17 ENCOUNTER — Ambulatory Visit (INDEPENDENT_AMBULATORY_CARE_PROVIDER_SITE_OTHER): Payer: PPO | Admitting: Family Medicine

## 2023-03-17 VITALS — BP 135/83 | HR 79 | Ht 62.0 in | Wt 154.0 lb

## 2023-03-17 DIAGNOSIS — I10 Essential (primary) hypertension: Secondary | ICD-10-CM | POA: Diagnosis not present

## 2023-03-17 DIAGNOSIS — E119 Type 2 diabetes mellitus without complications: Secondary | ICD-10-CM

## 2023-03-17 DIAGNOSIS — E782 Mixed hyperlipidemia: Secondary | ICD-10-CM

## 2023-03-17 DIAGNOSIS — Z7985 Long-term (current) use of injectable non-insulin antidiabetic drugs: Secondary | ICD-10-CM | POA: Diagnosis not present

## 2023-03-17 DIAGNOSIS — F439 Reaction to severe stress, unspecified: Secondary | ICD-10-CM

## 2023-03-17 MED ORDER — OZEMPIC (0.25 OR 0.5 MG/DOSE) 2 MG/3ML ~~LOC~~ SOPN
0.5000 mg | PEN_INJECTOR | SUBCUTANEOUS | 3 refills | Status: DC
  Filled 2023-03-17 – 2023-04-08 (×2): qty 3, 28d supply, fill #0
  Filled 2023-06-14: qty 3, 28d supply, fill #1

## 2023-03-17 NOTE — Progress Notes (Signed)
Established Patient Office Visit  Subjective   Patient ID: Priscilla Butler, female    DOB: [birth year]  Age: 72 y.o. MRN: [id number]  Chief Complaint  Patient presents with   Medical Management of Chronic Issues   Diabetes      Patient is here for routine 82-month follow-up. PCP already reviewed her routine labs from earlier this week - stable, no changes.   Hypertension: - Medications: amlodipine 5 mg daily - Compliance: good - Checking BP at home: no - Denies any SOB, recurrent headaches, CP, vision changes, LE edema, dizziness, palpitations, or medication side effects.   Hyperlipidemia: - medications: rosuvastatin 40 mg daily - compliance: good - medication SEs: none The 10-year ASCVD risk score (Arnett DK, et al., 2019) is: 25.3%   Values used to calculate the score:     Age: 56 years     Sex: Female     Is Non-Hispanic African American: No     Diabetic: Yes     Tobacco smoker: No     Systolic Blood Pressure: 135 mmHg     Is BP treated: Yes     HDL Cholesterol: 54.6 mg/dL     Total Cholesterol: 132 mg/dL   Diabetes: - Checking glucose at home: 130-170's  - Medications: metformin 500 mg BID, Ozempic 0.25 mg/week (maybe some mild upset stomach, but otherwise tolerating well; she would like to go up to the next dose) - Compliance: good - Diet: low carb, heart healthy  - Exercise: active lifestyle - Denies symptoms of hypoglycemia, polyuria, polydipsia, numbness extremities, foot ulcers/trauma, wounds that are not healing, medication side effects  Lab Results  Component Value Date   HGBA1C 7.0 (H) 03/14/2023    Stress/Anxiety: - she recently started Zoloft and thinks it may have triggered some nausea in the beginning. She does feel like it may be helping her irritability/stress. No SI/HI.      03/17/2023    2:52 PM 12/06/2022   11:16 AM 12/06/2022   11:15 AM  PHQ9 SCORE ONLY  PHQ-9 Total Score 2 1 1       03/17/2023    2:52 PM  GAD 7 : Generalized Anxiety Score   Nervous, Anxious, on Edge 0  Control/stop worrying 0  Worry too much - different things 0  Trouble relaxing 0  Restless 0  Easily annoyed or irritable 1  Afraid - awful might happen 0  Total GAD 7 Score 1  Anxiety Difficulty Not difficult at all         ROS All review of systems negative except what is listed in the HPI    Objective:     BP 135/83   Pulse 79   Ht 5\' 2"  (1.575 m)   Wt 154 lb (69.9 kg)   SpO2 97%   BMI 28.17 kg/m    Physical Exam Vitals reviewed.  Constitutional:      Appearance: Normal appearance.  Cardiovascular:     Rate and Rhythm: Normal rate and regular rhythm.     Pulses: Normal pulses.     Heart sounds: Normal heart sounds.  Pulmonary:     Effort: Pulmonary effort is normal.     Breath sounds: Normal breath sounds.  Musculoskeletal:     Cervical back: No tenderness.  Lymphadenopathy:     Cervical: No cervical adenopathy.  Skin:    General: Skin is warm and dry.  Neurological:     Mental Status: She is alert and oriented to person, place, and time.  Psychiatric:        Mood and Affect: Mood normal.        Behavior: Behavior normal.        Thought Content: Thought content normal.        Judgment: Judgment normal.      No results found for any visits on 03/17/23.    The 10-year ASCVD risk score (Arnett DK, et al., 2019) is: 25.3%    Assessment & Plan:   Problem List Items Addressed This Visit     Hyperlipidemia    Medication management: rosuvastatin 40 mg daily  Lifestyle factors for lowering cholesterol include: Diet therapy - heart-healthy diet rich in fruits, veggies, fiber-rich whole grains, lean meats, chicken, fish (at least twice a week), fat-free or 1% dairy products; foods low in saturated/trans fats, cholesterol, sodium, and sugar. Mediterranean diet has shown to be very heart healthy. Regular exercise - recommend at least 30 minutes a day, 5 times per week Weight management         DM (diabetes  mellitus), type 2 ([facility])    Well controlled with last A1c 7% Continue current medications and lifestyle measures F/u in 3 months       Relevant Medications   Semaglutide,0.25 or 0.5MG /DOS, (OZEMPIC, 0.25 OR 0.5 MG/DOSE,) 2 MG/3ML SOPN   HTN (hypertension) - Primary    Blood pressure is at goal for age and co-morbidities.   Recommendations: continue amlodipine - BP goal <130/80 - monitor and log blood pressures at home - check around the same time each day in a relaxed setting - Limit salt to <2000 mg/day - Follow DASH eating plan (heart healthy diet) - limit alcohol to 2 standard drinks per day for men and 1 per day for women - avoid tobacco products - get at least 2 hours of regular aerobic exercise weekly Patient aware of signs/symptoms requiring further/urgent evaluation. Labs updated earlier this week       Other Visit Diagnoses     Stress     Noticing some improvement with Zoloft. Would like to continue current dose for now. No SI/HI       Return in about 3 months (around 06/17/2023) for routine follow-up.    Clayborne Dana, NP

## 2023-03-17 NOTE — Patient Instructions (Signed)
Good to see you today! Dr. Abner Greenspan already reviewed your labs - looking good, no changes!

## 2023-03-17 NOTE — Assessment & Plan Note (Signed)
Medication management: rosuvastatin 40 mg daily  Lifestyle factors for lowering cholesterol include: Diet therapy - heart-healthy diet rich in fruits, veggies, fiber-rich whole grains, lean meats, chicken, fish (at least twice a week), fat-free or 1% dairy products; foods low in saturated/trans fats, cholesterol, sodium, and sugar. Mediterranean diet has shown to be very heart healthy. Regular exercise - recommend at least 30 minutes a day, 5 times per week Weight management

## 2023-03-17 NOTE — Assessment & Plan Note (Signed)
Well controlled with last A1c 7% Continue current medications and lifestyle measures F/u in 3 months

## 2023-03-17 NOTE — Assessment & Plan Note (Signed)
Blood pressure is at goal for age and co-morbidities.   Recommendations: continue amlodipine - BP goal <130/80 - monitor and log blood pressures at home - check around the same time each day in a relaxed setting - Limit salt to <2000 mg/day - Follow DASH eating plan (heart healthy diet) - limit alcohol to 2 standard drinks per day for men and 1 per day for women - avoid tobacco products - get at least 2 hours of regular aerobic exercise weekly Patient aware of signs/symptoms requiring further/urgent evaluation. Labs updated earlier this week

## 2023-03-31 ENCOUNTER — Other Ambulatory Visit: Payer: Self-pay

## 2023-03-31 ENCOUNTER — Other Ambulatory Visit: Payer: Self-pay | Admitting: Family Medicine

## 2023-03-31 MED ORDER — SERTRALINE HCL 50 MG PO TABS
50.0000 mg | ORAL_TABLET | Freq: Every day | ORAL | 3 refills | Status: DC
  Filled 2023-03-31: qty 30, 30d supply, fill #0
  Filled 2023-05-04: qty 30, 30d supply, fill #1
  Filled 2023-06-02: qty 30, 30d supply, fill #2
  Filled 2023-06-28: qty 30, 30d supply, fill #3

## 2023-03-31 MED ORDER — ROSUVASTATIN CALCIUM 40 MG PO TABS
40.0000 mg | ORAL_TABLET | Freq: Every day | ORAL | 1 refills | Status: DC
  Filled 2023-03-31: qty 90, 90d supply, fill #0
  Filled 2023-06-28: qty 90, 90d supply, fill #1

## 2023-04-01 ENCOUNTER — Other Ambulatory Visit: Payer: Self-pay | Admitting: Family Medicine

## 2023-04-01 ENCOUNTER — Other Ambulatory Visit: Payer: Self-pay

## 2023-04-04 ENCOUNTER — Other Ambulatory Visit (HOSPITAL_COMMUNITY): Payer: Self-pay

## 2023-04-04 MED ORDER — AMLODIPINE BESYLATE 5 MG PO TABS
5.0000 mg | ORAL_TABLET | Freq: Every day | ORAL | 1 refills | Status: DC
  Filled 2023-04-04 – 2023-05-04 (×2): qty 90, 90d supply, fill #0
  Filled 2023-08-05: qty 90, 90d supply, fill #1

## 2023-04-05 ENCOUNTER — Other Ambulatory Visit (HOSPITAL_COMMUNITY): Payer: Self-pay

## 2023-04-08 ENCOUNTER — Other Ambulatory Visit: Payer: Self-pay

## 2023-04-12 ENCOUNTER — Other Ambulatory Visit: Payer: Self-pay

## 2023-04-12 ENCOUNTER — Other Ambulatory Visit: Payer: Self-pay | Admitting: Family Medicine

## 2023-04-12 MED ORDER — METFORMIN HCL 500 MG PO TABS
500.0000 mg | ORAL_TABLET | Freq: Two times a day (BID) | ORAL | 0 refills | Status: DC
  Filled 2023-04-12: qty 180, 90d supply, fill #0

## 2023-04-21 DIAGNOSIS — L821 Other seborrheic keratosis: Secondary | ICD-10-CM | POA: Diagnosis not present

## 2023-04-21 DIAGNOSIS — D225 Melanocytic nevi of trunk: Secondary | ICD-10-CM | POA: Diagnosis not present

## 2023-04-21 DIAGNOSIS — Z08 Encounter for follow-up examination after completed treatment for malignant neoplasm: Secondary | ICD-10-CM | POA: Diagnosis not present

## 2023-04-21 DIAGNOSIS — Z85828 Personal history of other malignant neoplasm of skin: Secondary | ICD-10-CM | POA: Diagnosis not present

## 2023-04-21 DIAGNOSIS — R229 Localized swelling, mass and lump, unspecified: Secondary | ICD-10-CM | POA: Diagnosis not present

## 2023-05-04 ENCOUNTER — Other Ambulatory Visit: Payer: Self-pay

## 2023-05-04 ENCOUNTER — Other Ambulatory Visit: Payer: Self-pay | Admitting: Family Medicine

## 2023-05-04 MED ORDER — ONETOUCH ULTRA VI STRP
ORAL_STRIP | 12 refills | Status: AC
  Filled 2023-05-04: qty 200, 100d supply, fill #0

## 2023-05-23 ENCOUNTER — Other Ambulatory Visit (HOSPITAL_COMMUNITY): Payer: Self-pay

## 2023-05-31 ENCOUNTER — Other Ambulatory Visit: Payer: Self-pay

## 2023-05-31 ENCOUNTER — Telehealth: Payer: Self-pay | Admitting: Family Medicine

## 2023-05-31 DIAGNOSIS — I10 Essential (primary) hypertension: Secondary | ICD-10-CM

## 2023-05-31 DIAGNOSIS — E119 Type 2 diabetes mellitus without complications: Secondary | ICD-10-CM

## 2023-05-31 DIAGNOSIS — R030 Elevated blood-pressure reading, without diagnosis of hypertension: Secondary | ICD-10-CM

## 2023-05-31 DIAGNOSIS — E782 Mixed hyperlipidemia: Secondary | ICD-10-CM

## 2023-05-31 NOTE — Telephone Encounter (Signed)
Labs ordered placed and called pt lvm will need call back To make lab appt.

## 2023-05-31 NOTE — Telephone Encounter (Signed)
Pt would like to get her labs done prior to her 9/23 appt. Please advise once orders are in.

## 2023-06-02 ENCOUNTER — Other Ambulatory Visit (HOSPITAL_BASED_OUTPATIENT_CLINIC_OR_DEPARTMENT_OTHER): Payer: Self-pay

## 2023-06-02 ENCOUNTER — Other Ambulatory Visit: Payer: Self-pay

## 2023-06-03 ENCOUNTER — Other Ambulatory Visit: Payer: Self-pay

## 2023-06-14 ENCOUNTER — Other Ambulatory Visit: Payer: Self-pay

## 2023-06-16 ENCOUNTER — Encounter (INDEPENDENT_AMBULATORY_CARE_PROVIDER_SITE_OTHER): Payer: Self-pay

## 2023-06-20 DIAGNOSIS — H401113 Primary open-angle glaucoma, right eye, severe stage: Secondary | ICD-10-CM | POA: Diagnosis not present

## 2023-06-20 DIAGNOSIS — H2513 Age-related nuclear cataract, bilateral: Secondary | ICD-10-CM | POA: Diagnosis not present

## 2023-06-20 DIAGNOSIS — E119 Type 2 diabetes mellitus without complications: Secondary | ICD-10-CM | POA: Diagnosis not present

## 2023-06-20 DIAGNOSIS — H04123 Dry eye syndrome of bilateral lacrimal glands: Secondary | ICD-10-CM | POA: Diagnosis not present

## 2023-06-20 DIAGNOSIS — H5203 Hypermetropia, bilateral: Secondary | ICD-10-CM | POA: Diagnosis not present

## 2023-06-20 DIAGNOSIS — H43813 Vitreous degeneration, bilateral: Secondary | ICD-10-CM | POA: Diagnosis not present

## 2023-06-20 LAB — HM DIABETES EYE EXAM

## 2023-06-28 ENCOUNTER — Other Ambulatory Visit: Payer: Self-pay

## 2023-06-28 ENCOUNTER — Other Ambulatory Visit: Payer: Self-pay | Admitting: Family Medicine

## 2023-06-28 MED ORDER — ASPIRIN 81 MG PO TBEC
81.0000 mg | DELAYED_RELEASE_TABLET | Freq: Every day | ORAL | 3 refills | Status: AC
  Filled 2023-06-28: qty 90, 90d supply, fill #0
  Filled 2023-10-07: qty 90, 90d supply, fill #1

## 2023-06-30 ENCOUNTER — Other Ambulatory Visit: Payer: Self-pay

## 2023-07-11 ENCOUNTER — Other Ambulatory Visit: Payer: Self-pay

## 2023-07-11 ENCOUNTER — Other Ambulatory Visit: Payer: Self-pay | Admitting: Family Medicine

## 2023-07-11 MED ORDER — METFORMIN HCL 500 MG PO TABS
500.0000 mg | ORAL_TABLET | Freq: Two times a day (BID) | ORAL | 0 refills | Status: DC
  Filled 2023-07-11: qty 180, 90d supply, fill #0

## 2023-07-19 ENCOUNTER — Other Ambulatory Visit (INDEPENDENT_AMBULATORY_CARE_PROVIDER_SITE_OTHER): Payer: PPO

## 2023-07-19 ENCOUNTER — Ambulatory Visit: Payer: PPO | Admitting: Family Medicine

## 2023-07-19 DIAGNOSIS — E119 Type 2 diabetes mellitus without complications: Secondary | ICD-10-CM

## 2023-07-19 DIAGNOSIS — E782 Mixed hyperlipidemia: Secondary | ICD-10-CM | POA: Diagnosis not present

## 2023-07-19 DIAGNOSIS — R030 Elevated blood-pressure reading, without diagnosis of hypertension: Secondary | ICD-10-CM

## 2023-07-19 LAB — LIPID PANEL
Cholesterol: 133 mg/dL (ref 0–200)
HDL: 64.5 mg/dL (ref >39.00)
LDL Cholesterol: 56 mg/dL (ref 0–99)
NonHDL: 68.46
Total CHOL/HDL Ratio: 2
Triglycerides: 64 mg/dL (ref 0.0–149.0)
VLDL: 12.8 mg/dL (ref 0.0–40.0)

## 2023-07-19 LAB — CBC WITH DIFFERENTIAL/PLATELET
Basophils Absolute: 0.1 10*3/uL (ref 0.0–0.1)
Basophils Relative: 0.9 % (ref 0.0–3.0)
Eosinophils Absolute: 0.2 10*3/uL (ref 0.0–0.7)
Eosinophils Relative: 3.8 % (ref 0.0–5.0)
HCT: 39.6 % (ref 36.0–46.0)
Hemoglobin: 13 g/dL (ref 12.0–15.0)
Lymphocytes Relative: 28.6 % (ref 12.0–46.0)
Lymphs Abs: 1.9 10*3/uL (ref 0.7–4.0)
MCHC: 32.9 g/dL (ref 30.0–36.0)
MCV: 88.4 fl (ref 78.0–100.0)
Monocytes Absolute: 0.7 10*3/uL (ref 0.1–1.0)
Monocytes Relative: 11 % (ref 3.0–12.0)
Neutro Abs: 3.6 10*3/uL (ref 1.4–7.7)
Neutrophils Relative %: 55.7 % (ref 43.0–77.0)
Platelets: 278 10*3/uL (ref 150.0–400.0)
RBC: 4.48 Mil/uL (ref 3.87–5.11)
RDW: 13.5 % (ref 11.5–15.5)
WBC: 6.5 10*3/uL (ref 4.0–10.5)

## 2023-07-19 LAB — COMPREHENSIVE METABOLIC PANEL
ALT: 17 U/L (ref 0–35)
AST: 17 U/L (ref 0–37)
Albumin: 4.3 g/dL (ref 3.5–5.2)
Alkaline Phosphatase: 56 U/L (ref 39–117)
BUN: 15 mg/dL (ref 6–23)
CO2: 28 meq/L (ref 19–32)
Calcium: 9.3 mg/dL (ref 8.4–10.5)
Chloride: 105 meq/L (ref 96–112)
Creatinine, Ser: 0.8 mg/dL (ref 0.40–1.20)
GFR: 73.73 mL/min (ref >60.00)
Glucose, Bld: 117 mg/dL — ABNORMAL HIGH (ref 70–99)
Potassium: 4.2 meq/L (ref 3.5–5.1)
Sodium: 140 meq/L (ref 135–145)
Total Bilirubin: 0.8 mg/dL (ref 0.2–1.2)
Total Protein: 6.4 g/dL (ref 6.0–8.3)

## 2023-07-19 LAB — HEMOGLOBIN A1C: Hgb A1c MFr Bld: 6.7 % — ABNORMAL HIGH (ref 4.6–6.5)

## 2023-07-19 LAB — TSH: TSH: 1.38 u[IU]/mL (ref 0.35–5.50)

## 2023-07-24 NOTE — Assessment & Plan Note (Signed)
Well controlled, no changes to meds. Encouraged heart healthy diet such as the DASH diet and exercise as tolerated.  °

## 2023-07-24 NOTE — Assessment & Plan Note (Addendum)
hgba1c elevated, minimize simple carbs. Increase exercise as tolerated. Continue current meds including and titrate up as needed and as tolerated.

## 2023-07-24 NOTE — Assessment & Plan Note (Signed)
Tolerating statin, encouraged heart healthy diet, avoid trans fats, minimize simple carbs and saturated fats. Increase exercise as tolerated 

## 2023-07-24 NOTE — Assessment & Plan Note (Signed)
Encouraged to get adequate exercise, calcium and vitamin d intake 

## 2023-07-25 ENCOUNTER — Ambulatory Visit (INDEPENDENT_AMBULATORY_CARE_PROVIDER_SITE_OTHER): Payer: PPO | Admitting: Family Medicine

## 2023-07-25 ENCOUNTER — Other Ambulatory Visit (HOSPITAL_BASED_OUTPATIENT_CLINIC_OR_DEPARTMENT_OTHER): Payer: Self-pay

## 2023-07-25 ENCOUNTER — Other Ambulatory Visit: Payer: Self-pay

## 2023-07-25 VITALS — BP 128/78 | HR 75 | Temp 98.0°F | Resp 16 | Ht 62.0 in | Wt 154.0 lb

## 2023-07-25 DIAGNOSIS — M8580 Other specified disorders of bone density and structure, unspecified site: Secondary | ICD-10-CM

## 2023-07-25 DIAGNOSIS — H6122 Impacted cerumen, left ear: Secondary | ICD-10-CM | POA: Diagnosis not present

## 2023-07-25 DIAGNOSIS — E782 Mixed hyperlipidemia: Secondary | ICD-10-CM

## 2023-07-25 DIAGNOSIS — I10 Essential (primary) hypertension: Secondary | ICD-10-CM

## 2023-07-25 DIAGNOSIS — Z23 Encounter for immunization: Secondary | ICD-10-CM | POA: Diagnosis not present

## 2023-07-25 DIAGNOSIS — E119 Type 2 diabetes mellitus without complications: Secondary | ICD-10-CM | POA: Diagnosis not present

## 2023-07-25 DIAGNOSIS — H6120 Impacted cerumen, unspecified ear: Secondary | ICD-10-CM | POA: Insufficient documentation

## 2023-07-25 MED ORDER — TIRZEPATIDE 2.5 MG/0.5ML ~~LOC~~ SOAJ
2.5000 mg | SUBCUTANEOUS | 3 refills | Status: DC
  Filled 2023-07-25 – 2023-08-01 (×2): qty 2, 28d supply, fill #0
  Filled 2023-09-05: qty 2, 28d supply, fill #1

## 2023-07-25 NOTE — Progress Notes (Signed)
Subjective:    Patient ID: Priscilla Butler, female    DOB: 1951-03-05, 72 y.o.   MRN: [id number]  Chief Complaint  Patient presents with   Follow-up    Follow up on Medication     HPI Discussed the use of AI scribe software for clinical note transcription with the patient, who gave verbal consent to proceed.  History of Present Illness   The patient, with a history of diabetes and constipation, presents for a routine follow-up. They report a recent episode of lymph node swelling on the left side of the neck, which resolved spontaneously. This was followed by facial swelling on the left side, which also resolved on its own. The patient then experienced jaw pain, which improved with the use of ear drops for dermatitis. The patient denies any dental infections.  The patient also reports ongoing issues with constipation, which is currently managed with Metamucil and a probiotic. They report less frequent and harder stools, but within the normal range of up to three times a day or as infrequently as every third day.  The patient's diabetes is managed with Ozempic, but they report side effects including nausea and constipation. They are considering switching to [facility] to manage their diabetes.  The patient is due for a flu shot and is considering a COVID booster shot. They report no recent ER visits or chest pain.        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   DeQuervain's disease (tenosynovitis)    DM (diabetes mellitus), type 2 (HCC)    H/O measles    H/O mumps    History of chicken pox    Hx of TB skin testing    Hyperglycemia    Hyperlipidemia    Menopause    Osteopenia    Otitis externa of left ear 01/30/2017   Preventative health care 01/19/2016   Welcome to Medicare preventive visit 01/27/2017    Past Surgical History:  Procedure Laterality Date   BREAST  LUMPECTOMY  03/2008   Right breast   MOHS SURGERY  2013   BCC right upper lip, [place] skin surgery   SKIN SURGERY     facial  skin cancer   WISDOM TOOTH EXTRACTION  1972    Family History  Problem Relation Age of Onset   Breast cancer Maternal Grandmother    Cancer Maternal Grandmother 69       ish   Cancer Maternal Grandfather        lung   Hypertension Mother    Hyperlipidemia Mother    Arthritis Mother    Other Paternal Grandmother     Social History   Socioeconomic History   Marital status: Married    Spouse name: Claris Gladden   Number of children: 1   Years of education: BA   Highest education level: Bachelor's degree (e.g., BA, AB, BS)  Occupational History   Occupation: Retired    Associate Professor: UNEMPLOYED  Tobacco Use   Smoking status: Former    Current packs/day: 0.00    Types: Cigarettes    Quit date: 11/02/1999    Years since quitting: 23.7   Smokeless tobacco: Never  Substance and Sexual Activity   Alcohol use: Yes    Alcohol/week: 3.0 standard drinks of alcohol    Types: 3 Cans of beer per week   Drug use: No   Sexual activity:  Yes    Partners: Male    Birth control/protection: Post-menopausal    Comment: lives with husband, retired from Audiological scientist, Engineer, agricultural, No dietary restrictions  Other Topics Concern   Not on file  Social History Narrative   Not on file   Social Determinants of Health   Financial Resource Strain: Low Risk  (07/25/2023)   Overall Financial Resource Strain (CARDIA)    Difficulty of Paying Living Expenses: Not hard at all  Food Insecurity: No Food Insecurity (07/25/2023)   Hunger Vital Sign    Worried About Running Out of Food in the Last Year: Never true    Ran Out of Food in the Last Year: Never true  Transportation Needs: No Transportation Needs (07/25/2023)   PRAPARE - Administrator, Civil Service (Medical): No    Lack of Transportation (Non-Medical): No  Physical Activity: Insufficiently Active (07/25/2023)    Exercise Vital Sign    Days of Exercise per Week: 4 days    Minutes of Exercise per Session: 30 min  Stress: No Stress Concern Present (07/25/2023)   [employer] of Occupational Health - Occupational Stress Questionnaire    Feeling of Stress : Only a little  Social Connections: Moderately Isolated (07/25/2023)   Social Connection and Isolation Panel [NHANES]    Frequency of Communication with Friends and Family: Once a week    Frequency of Social Gatherings with Friends and Family: Once a week    Attends Religious Services: 1 to 4 times per year    Active Member of [employer] or Organizations: No    Attends Banker Meetings: Never    Marital Status: Married  Catering manager Violence: Not At Risk (08/25/2022)   Humiliation, Afraid, Rape, and Kick questionnaire    Fear of Current or Ex-Partner: No    Emotionally Abused: No    Physically Abused: No    Sexually Abused: No    Outpatient Medications Prior to Visit  Medication Sig Dispense Refill   amLODipine (NORVASC) 5 MG tablet Take 1 tablet (5 mg total) by mouth daily. 90 tablet 1   aspirin EC 81 MG tablet Take 1 tablet (81 mg total) by mouth daily. 90 tablet 3   Black Pepper-Turmeric (TURMERIC COMPLEX/BLACK PEPPER) 3-500 MG CAPS TAKE 1 CAPSULE BY MOUTH 2 (TWO) TIMES DAILY. 180 capsule 1   Calcium Carb-Cholecalciferol 600-10 MG-MCG TABS Take 1 tablet by mouth 2 (two) times daily. 180 tablet 1   cholecalciferol (VITAMIN D3) 25 MCG (1000 UNIT) tablet TAKE 1 TABLET BY MOUTH ONCE DAILY 100 tablet 1   CRANBERRY PO Take 84 mg by mouth daily.     dorzolamide-timolol (COSOPT) 2-0.5 % ophthalmic solution Place 1 drop into both eyes 2 (two) times daily. 20 mL 3   Fluocinolone Acetonide 0.01 % OIL Place 1 drop into the ear as directed 2 times daily as needed for itching 20 mL 1   glucose blood (ONETOUCH ULTRA) test strip USE TO TEST BLOOD SUGAR TWICE A DAY AS DIRECTED 200 strip 12   latanoprost (XALATAN) 0.005 % ophthalmic solution  Place 1 drop into both eyes at bedtime. 10 mL 3   metFORMIN (GLUCOPHAGE) 500 MG tablet Take 1 tablet (500 mg total) by mouth 2 (two) times daily with a meal. 180 tablet 0   Multiple Vitamin (MULTIVITAMIN) tablet Take 1 tablet by mouth daily.     Omega-3 Fatty Acids (FISH OIL) 1000 MG CAPS Take 1 capsule by mouth 2 (two) times daily.  Probiotic Product (PROBIOTIC DAILY) CAPS Take 1 capsule by mouth daily. NOW company     Psyllium (METAMUCIL SMOOTH TEXTURE) 28.3 % POWD 1 tsp daily in water     rosuvastatin (CRESTOR) 40 MG tablet Take 1 tablet (40 mg total) by mouth daily. 90 tablet 1   sertraline (ZOLOFT) 50 MG tablet Take 1 tablet (50 mg total) by mouth daily. 30 tablet 3   Semaglutide,0.25 or 0.5MG /DOS, (OZEMPIC, 0.25 OR 0.5 MG/DOSE,) 2 MG/3ML SOPN Inject 0.5 mg into the skin once a week. 3 mL 3   No facility-administered medications prior to visit.    Allergies  Allergen Reactions   Lisinopril Cough    Review of Systems  Constitutional:  Negative for fever and malaise/fatigue.  HENT:  Positive for ear pain. Negative for congestion, ear discharge, hearing loss, nosebleeds and tinnitus.   Eyes:  Negative for blurred vision.  Respiratory:  Negative for shortness of breath.   Cardiovascular:  Negative for chest pain, palpitations and leg swelling.  Gastrointestinal:  Negative for abdominal pain, blood in stool and nausea.  Genitourinary:  Negative for dysuria and frequency.  Musculoskeletal:  Negative for back pain and falls.  Skin:  Negative for rash.  Neurological:  Negative for dizziness, loss of consciousness and headaches.  Endo/Heme/Allergies:  Negative for environmental allergies.  Psychiatric/Behavioral:  Negative for depression. The patient is not nervous/anxious.        Objective:    Physical Exam Constitutional:      General: She is not in acute distress.    Appearance: Normal appearance. She is well-developed. She is not toxic-appearing.  HENT:     Head:  Normocephalic and atraumatic.     Right Ear: Tympanic membrane, ear canal and external ear normal. There is no impacted cerumen.     Left Ear: External ear normal. There is impacted cerumen.     Nose: Nose normal.  Eyes:     General:        Right eye: No discharge.        Left eye: No discharge.     Conjunctiva/sclera: Conjunctivae normal.  Neck:     Thyroid: No thyromegaly.  Cardiovascular:     Rate and Rhythm: Normal rate and regular rhythm.     Heart sounds: Normal heart sounds. No murmur heard. Pulmonary:     Effort: Pulmonary effort is normal. No respiratory distress.     Breath sounds: Normal breath sounds.  Abdominal:     General: Bowel sounds are normal.     Palpations: Abdomen is soft.     Tenderness: There is no abdominal tenderness. There is no guarding.  Musculoskeletal:        General: Normal range of motion.     Cervical back: Neck supple.  Lymphadenopathy:     Cervical: No cervical adenopathy.  Skin:    General: Skin is warm and dry.  Neurological:     Mental Status: She is alert and oriented to person, place, and time.  Psychiatric:        Mood and Affect: Mood normal.        Behavior: Behavior normal.        Thought Content: Thought content normal.        Judgment: Judgment normal.     BP 128/78 (BP Location: Left Arm, Patient Position: Sitting, Cuff Size: Normal)   Pulse 75   Temp 98 F (36.7 C) (Oral)   Resp 16   Ht 5\' 2"  (1.575 m)   Wt  154 lb (69.9 kg)   SpO2 97%   BMI 28.17 kg/m  Wt Readings from Last 3 Encounters:  07/25/23 154 lb (69.9 kg)  03/17/23 154 lb (69.9 kg)  12/06/22 162 lb (73.5 kg)    Diabetic Foot Exam - Simple   No data filed    Lab Results  Component Value Date   WBC 6.5 07/19/2023   HGB 13.0 07/19/2023   HCT 39.6 07/19/2023   PLT 278.0 07/19/2023   GLUCOSE 117 (H) 07/19/2023   CHOL 133 07/19/2023   TRIG 64.0 07/19/2023   HDL 64.50 07/19/2023   LDLCALC 56 07/19/2023   ALT 17 07/19/2023   AST 17 07/19/2023   NA  140 07/19/2023   K 4.2 07/19/2023   CL 105 07/19/2023   CREATININE 0.80 07/19/2023   BUN 15 07/19/2023   CO2 28 07/19/2023   TSH 1.38 07/19/2023   HGBA1C 6.7 (H) 07/19/2023   MICROALBUR <0.2 03/14/2023    Lab Results  Component Value Date   TSH 1.38 07/19/2023   Lab Results  Component Value Date   WBC 6.5 07/19/2023   HGB 13.0 07/19/2023   HCT 39.6 07/19/2023   MCV 88.4 07/19/2023   PLT 278.0 07/19/2023   Lab Results  Component Value Date   NA 140 07/19/2023   K 4.2 07/19/2023   CO2 28 07/19/2023   GLUCOSE 117 (H) 07/19/2023   BUN 15 07/19/2023   CREATININE 0.80 07/19/2023   BILITOT 0.8 07/19/2023   ALKPHOS 56 07/19/2023   AST 17 07/19/2023   ALT 17 07/19/2023   PROT 6.4 07/19/2023   ALBUMIN 4.3 07/19/2023   CALCIUM 9.3 07/19/2023   GFR 73.73 07/19/2023   Lab Results  Component Value Date   CHOL 133 07/19/2023   Lab Results  Component Value Date   HDL 64.50 07/19/2023   Lab Results  Component Value Date   LDLCALC 56 07/19/2023   Lab Results  Component Value Date   TRIG 64.0 07/19/2023   Lab Results  Component Value Date   CHOLHDL 2 07/19/2023   Lab Results  Component Value Date   HGBA1C 6.7 (H) 07/19/2023       Assessment & Plan:  Hypertension, unspecified type Assessment & Plan: Well controlled, no changes to meds. Encouraged heart healthy diet such as the DASH diet and exercise as tolerated.    Orders: -     Comprehensive metabolic panel -     CBC with Differential/Platelet -     TSH  Mixed hyperlipidemia Assessment & Plan: Tolerating statin, encouraged heart healthy diet, avoid trans fats, minimize simple carbs and saturated fats. Increase exercise as tolerated  Orders: -     Lipid panel  Osteopenia, unspecified location Assessment & Plan: Encouraged to get adequate exercise, calcium and vitamin d intake    Type 2 diabetes mellitus without complication, without long-term current use of insulin (HCC) Assessment & Plan: hgba1c  elevated, minimize simple carbs. Increase exercise as tolerated. Continue current meds including and titrate up as needed and as tolerated.   Orders: -     Hemoglobin A1c -     Microalbumin / creatinine urine ratio  Need for influenza vaccination -     Flu Vaccine Trivalent High Dose (Fluad)  Impacted cerumen of left ear Assessment & Plan: Left ear with cerumen impaction and had been noting lymphadenopathy in left neck but that has resolved. Had some pain over TMJ on left but that is resolving. If it returns she will let  us know. Use H2O2 in ear after shower daily for 1 week and then flush ear as needed.    Other orders -     Tirzepatide; Inject 2.5 mg into the skin once a week.  Dispense: 2 mL; Refill: 3    Assessment and Plan    Lymphadenopathy Recent history of swelling in the left side of the neck and face, with associated jaw pain. No dental infection identified. Pain has since resolved. Possible connection to dermatitis in the ear. -Continue monitoring. If symptoms recur or do not fully resolve, consider more extensive imaging.  Dermatitis Itching, flaking, and pain in the ear, possibly contributing to lymphadenopathy. Improvement noted with regular use of ear drops. -Continue regular use of ear drops. -Consider using peroxide after showering to manage wax buildup.  Constipation Reported despite daily bowel movements. Currently using Metamucil and a probiotic. -Increase Metamucil to twice daily. -Ensure adequate fluid intake. -Consider using Senna, Docusate, or Miralax if needed. -If constipation persists, consider milk of magnesia in warm prune juice and a Doprolath suppository.  Diabetes Controlled with Ozempic 0.25mg . Patient considering switching to [facility] due to cost. -Continue Ozempic 0.25mg . -Consider switching to [facility] if patient desires and if insurance allows.  General Health Maintenance -Administer flu shot today. -Consider COVID booster at patient's  discretion, ideally 2-3 weeks after flu shot. -Schedule follow-up appointment in 6 months with labs prior to visit. -PAP smear not due at next visit (last performed in 2022).         Danise Edge, MD

## 2023-07-25 NOTE — Patient Instructions (Addendum)
Netflix Live to 100 the Blue Zones   Pendulum probiotics online  Senna is a natural laxative Miralax once to twice daily Docusate is a stool softener  Encouraged increased hydration and fiber in diet. Daily probiotics. Ifnesium bowels not moving can use Milk of Magnesium 2 tbls po in 4 oz of warm prune juice by mouth every 2-3 days. If no results then repeat in 4 hours with  Dulcolax suppository pr, may repeat again in 4 more hours as needed. Seek care if symptoms worsen.    Lymphadenopathy  Lymphadenopathy is when your lymph glands are swollen or larger than normal.  Lymph glands, also called lymph nodes, are clumps of tissue. They filter germs and waste from tissues in your body to your bloodstream. They're part of your body's defense system, or immune system. Lymphadenopathy has different causes, like infection, autoimmune disease, and cancer. Lymphadenopathy can happen wherever you have lymph nodes. The type you have depends on which nodes it's in, such as: Cervical lymphadenopathy. This is in the neck. Mediastinal lymphadenopathy. This is in the chest. Hilar lymphadenopathy. This is in the lungs. Axillary lymphadenopathy. This is in the armpits. Inguinal lymphadenopathy. This is in the groin. Sometimes, fluid and cells that fight infection build up in your lymph nodes. This happens when your immune system reacts to germs or other substances that get into your body. This makes lymph nodes swell and get bigger. Treatment is based on what's thought to be the cause. Sometimes, lymph nodes don't go back to normal size after treatment. If yours don't, your health care provider may order tests to help learn why your glands are still swollen and big. Follow these instructions at home:  Take over-the-counter and prescription medicines only as told by your provider. If you were prescribed antibiotics, do not stop using them, even if you start to feel better. If told, apply heat to swollen lymph  nodes as told by your provider. Use the heat source that your provider recommends, such as a moist heat pack or a heating pad. Place a towel between your skin and the heat source. Leave the heat on only for the time told by your provider to avoid injury. If your skin turns bright red, remove the heat right away to prevent burns. The risk of burns is higher if you cannot feel pain, heat, or cold. Check your swollen lymph nodes every day for changes. Check other places where you have lymph nodes as told. Check for changes such as: More swelling. Sudden growth in size. Redness or pain. Hardness. Contact a health care provider if: You have lymph nodes that: Are still swollen after 2 weeks. Have gotten bigger all of a sudden or the swelling spreads. Are red, painful, or hard. Fluid leaks from the skin near a swollen lymph node. You get a fever, chills, or night sweats. You feel tired. You have a sore throat. Your abdomen hurts. You lose weight without trying. This information is not intended to replace advice given to you by your health care provider. Make sure you discuss any questions you have with your health care provider. Document Revised: 01/12/2023 Document Reviewed: 01/12/2023 Elsevier Patient Education  2024 [employer].

## 2023-07-25 NOTE — Assessment & Plan Note (Addendum)
Left ear with cerumen impaction and had been noting lymphadenopathy in left neck but that has resolved. Had some pain over TMJ on left but that is resolving. If it returns she will let us know. Use H2O2 in ear after shower daily for 1 week and then flush ear as needed.

## 2023-07-26 ENCOUNTER — Telehealth: Payer: Self-pay

## 2023-07-26 ENCOUNTER — Other Ambulatory Visit (HOSPITAL_COMMUNITY): Payer: Self-pay

## 2023-07-26 NOTE — Telephone Encounter (Signed)
Pharmacy Patient Advocate Encounter   Received notification from Physician's Office that prior authorization for Priscilla Butler is required/requested.   Insurance verification completed.   The patient is insured through [facility] ADVANTAGE/RX ADVANCE .   Per test claim: PA required; PA submitted to [facility] ADVANTAGE/RX ADVANCE via Fax Key/confirmation #/EOC key  [id number] Status is pending

## 2023-07-27 ENCOUNTER — Other Ambulatory Visit (HOSPITAL_BASED_OUTPATIENT_CLINIC_OR_DEPARTMENT_OTHER): Payer: Self-pay

## 2023-07-28 ENCOUNTER — Other Ambulatory Visit (HOSPITAL_COMMUNITY): Payer: Self-pay

## 2023-07-28 NOTE — Telephone Encounter (Signed)
Pharmacy Patient Advocate Encounter  Received notification from [facility] ADVANTAGE/RX ADVANCE that Prior Authorization for [facility] has been APPROVED from 07/26/23 to 07/25/24. Ran test claim, Copay is $119. This test claim was processed through [facility]- copay amounts may vary at other pharmacies due to pharmacy/plan contracts, or as the patient moves through the different stages of their insurance plan.   PA #/Case ID/Reference #: [id number]

## 2023-07-28 NOTE — Telephone Encounter (Signed)
Sent pt message letting her know about PA.

## 2023-08-01 ENCOUNTER — Other Ambulatory Visit (HOSPITAL_BASED_OUTPATIENT_CLINIC_OR_DEPARTMENT_OTHER): Payer: Self-pay

## 2023-08-01 ENCOUNTER — Other Ambulatory Visit: Payer: Self-pay

## 2023-08-02 ENCOUNTER — Encounter: Payer: Self-pay | Admitting: Family Medicine

## 2023-08-05 ENCOUNTER — Other Ambulatory Visit: Payer: Self-pay

## 2023-08-05 ENCOUNTER — Other Ambulatory Visit (HOSPITAL_BASED_OUTPATIENT_CLINIC_OR_DEPARTMENT_OTHER): Payer: Self-pay

## 2023-08-05 ENCOUNTER — Other Ambulatory Visit (HOSPITAL_COMMUNITY): Payer: Self-pay

## 2023-08-05 ENCOUNTER — Other Ambulatory Visit: Payer: Self-pay | Admitting: Family Medicine

## 2023-08-05 MED ORDER — CALCIUM CARB-CHOLECALCIFEROL 600-10 MG-MCG PO TABS
1.0000 | ORAL_TABLET | Freq: Two times a day (BID) | ORAL | 1 refills | Status: AC
  Filled 2023-08-05: qty 180, 90d supply, fill #0

## 2023-08-05 MED ORDER — SERTRALINE HCL 50 MG PO TABS
50.0000 mg | ORAL_TABLET | Freq: Every day | ORAL | 1 refills | Status: DC
  Filled 2023-08-05: qty 90, 90d supply, fill #0
  Filled 2023-11-04: qty 90, 90d supply, fill #1

## 2023-08-06 ENCOUNTER — Other Ambulatory Visit (HOSPITAL_COMMUNITY): Payer: Self-pay

## 2023-08-08 ENCOUNTER — Other Ambulatory Visit (HOSPITAL_COMMUNITY): Payer: Self-pay

## 2023-08-24 ENCOUNTER — Other Ambulatory Visit (HOSPITAL_BASED_OUTPATIENT_CLINIC_OR_DEPARTMENT_OTHER): Payer: Self-pay

## 2023-08-24 MED ORDER — COVID-19 MRNA VAC-TRIS(PFIZER) 30 MCG/0.3ML IM SUSY
0.3000 mL | PREFILLED_SYRINGE | Freq: Once | INTRAMUSCULAR | 0 refills | Status: AC
Start: 2023-08-24 — End: 2023-08-25
  Filled 2023-08-24: qty 0.3, 1d supply, fill #0

## 2023-09-05 ENCOUNTER — Other Ambulatory Visit (HOSPITAL_COMMUNITY): Payer: Self-pay

## 2023-09-05 ENCOUNTER — Other Ambulatory Visit: Payer: Self-pay

## 2023-09-08 ENCOUNTER — Other Ambulatory Visit: Payer: Self-pay

## 2023-09-08 ENCOUNTER — Encounter: Payer: Self-pay | Admitting: Family Medicine

## 2023-09-08 ENCOUNTER — Other Ambulatory Visit: Payer: Self-pay | Admitting: Family Medicine

## 2023-09-08 DIAGNOSIS — E119 Type 2 diabetes mellitus without complications: Secondary | ICD-10-CM

## 2023-09-08 MED ORDER — OZEMPIC (0.25 OR 0.5 MG/DOSE) 2 MG/3ML ~~LOC~~ SOPN
0.2500 mg | PEN_INJECTOR | SUBCUTANEOUS | 2 refills | Status: DC
  Filled 2023-09-08: qty 3, 28d supply, fill #0
  Filled 2023-09-09: qty 3, 56d supply, fill #0
  Filled 2023-11-04: qty 3, 56d supply, fill #1

## 2023-09-09 ENCOUNTER — Other Ambulatory Visit: Payer: Self-pay

## 2023-09-09 ENCOUNTER — Other Ambulatory Visit (HOSPITAL_BASED_OUTPATIENT_CLINIC_OR_DEPARTMENT_OTHER): Payer: Self-pay

## 2023-09-21 ENCOUNTER — Other Ambulatory Visit (HOSPITAL_COMMUNITY): Payer: Self-pay

## 2023-10-07 ENCOUNTER — Other Ambulatory Visit (HOSPITAL_COMMUNITY): Payer: Self-pay

## 2023-10-07 ENCOUNTER — Other Ambulatory Visit: Payer: Self-pay

## 2023-10-07 ENCOUNTER — Other Ambulatory Visit: Payer: Self-pay | Admitting: Family Medicine

## 2023-10-07 MED ORDER — METFORMIN HCL 500 MG PO TABS
500.0000 mg | ORAL_TABLET | Freq: Two times a day (BID) | ORAL | 0 refills | Status: DC
  Filled 2023-10-07: qty 180, 90d supply, fill #0

## 2023-10-07 MED ORDER — ROSUVASTATIN CALCIUM 40 MG PO TABS
40.0000 mg | ORAL_TABLET | Freq: Every day | ORAL | 0 refills | Status: DC
  Filled 2023-10-07: qty 90, 90d supply, fill #0

## 2023-10-07 MED ORDER — FLUOCINOLONE ACETONIDE 0.01 % OT OIL
TOPICAL_OIL | OTIC | 1 refills | Status: AC
  Filled 2023-10-07: qty 20, 90d supply, fill #0
  Filled 2024-09-18: qty 20, 90d supply, fill #1

## 2023-10-24 DIAGNOSIS — H401113 Primary open-angle glaucoma, right eye, severe stage: Secondary | ICD-10-CM | POA: Diagnosis not present

## 2023-11-04 ENCOUNTER — Other Ambulatory Visit: Payer: Self-pay | Admitting: Family Medicine

## 2023-11-04 ENCOUNTER — Other Ambulatory Visit: Payer: Self-pay

## 2023-11-04 ENCOUNTER — Other Ambulatory Visit: Payer: Self-pay | Admitting: Family

## 2023-11-04 ENCOUNTER — Encounter: Payer: Self-pay | Admitting: Family Medicine

## 2023-11-04 ENCOUNTER — Other Ambulatory Visit (HOSPITAL_COMMUNITY): Payer: Self-pay

## 2023-11-04 ENCOUNTER — Other Ambulatory Visit (HOSPITAL_BASED_OUTPATIENT_CLINIC_OR_DEPARTMENT_OTHER): Payer: Self-pay

## 2023-11-04 MED ORDER — AMLODIPINE BESYLATE 5 MG PO TABS
5.0000 mg | ORAL_TABLET | Freq: Every day | ORAL | 1 refills | Status: DC
  Filled 2023-11-04: qty 90, 90d supply, fill #0
  Filled 2024-01-25: qty 90, 90d supply, fill #1

## 2023-11-04 MED ORDER — OZEMPIC (0.25 OR 0.5 MG/DOSE) 2 MG/3ML ~~LOC~~ SOPN
0.5000 mg | PEN_INJECTOR | SUBCUTANEOUS | 1 refills | Status: DC
  Filled 2023-11-04: qty 3, 28d supply, fill #0
  Filled 2023-12-23 – 2024-01-05 (×6): qty 3, 28d supply, fill #1

## 2023-11-16 ENCOUNTER — Other Ambulatory Visit: Payer: Self-pay

## 2023-12-05 ENCOUNTER — Other Ambulatory Visit: Payer: Self-pay

## 2023-12-06 ENCOUNTER — Other Ambulatory Visit (HOSPITAL_COMMUNITY): Payer: Self-pay

## 2023-12-06 MED ORDER — DORZOLAMIDE HCL-TIMOLOL MAL 2-0.5 % OP SOLN
1.0000 [drp] | Freq: Two times a day (BID) | OPHTHALMIC | 3 refills | Status: AC
  Filled 2023-12-06: qty 20, 100d supply, fill #0
  Filled 2024-02-29: qty 20, 100d supply, fill #1
  Filled 2024-05-24: qty 20, 100d supply, fill #2
  Filled 2024-08-24: qty 20, 100d supply, fill #3

## 2023-12-23 ENCOUNTER — Other Ambulatory Visit: Payer: Self-pay

## 2023-12-23 ENCOUNTER — Other Ambulatory Visit (HOSPITAL_COMMUNITY): Payer: Self-pay

## 2023-12-27 ENCOUNTER — Other Ambulatory Visit: Payer: Self-pay

## 2023-12-27 ENCOUNTER — Other Ambulatory Visit (HOSPITAL_COMMUNITY): Payer: Self-pay

## 2024-01-02 ENCOUNTER — Telehealth: Payer: Self-pay

## 2024-01-02 ENCOUNTER — Other Ambulatory Visit (HOSPITAL_COMMUNITY): Payer: Self-pay

## 2024-01-02 NOTE — Telephone Encounter (Signed)
 Copied from CRM [id number]. Topic: Clinical - Prescription Issue >> Jan 02, 2024 11:02 AM Drema Balzarine wrote: Reason for CRM: [facility] pharmacy called to inform us that patients Ozympic requires a prior authorization and the Cover My Meds Key # is [facility] >> Jan 02, 2024 11:09 AM CMA Maralyn Sago A wrote: Wrong office

## 2024-01-04 ENCOUNTER — Other Ambulatory Visit: Payer: Self-pay

## 2024-01-05 ENCOUNTER — Other Ambulatory Visit: Payer: Self-pay

## 2024-01-05 ENCOUNTER — Encounter: Payer: Self-pay | Admitting: Family Medicine

## 2024-01-05 ENCOUNTER — Other Ambulatory Visit (HOSPITAL_COMMUNITY): Payer: Self-pay

## 2024-01-05 ENCOUNTER — Telehealth: Payer: Self-pay

## 2024-01-05 NOTE — Telephone Encounter (Signed)
 PA request has been Submitted. New Encounter has been or will be created for follow up. For additional info see Pharmacy Prior Auth telephone encounter from 01/05/24.

## 2024-01-05 NOTE — Telephone Encounter (Signed)
 Pharmacy Patient Advocate Encounter   Received notification from Pt Calls Messages that prior authorization for Ozempic (0.25 or 0.5 MG/DOSE) 2MG /3ML pen-injectors is required/requested.   Insurance verification completed.   The patient is insured through [facility] ADVANTAGE/RX ADVANCE .   Per test claim: PA required; PA submitted to above mentioned insurance via CoverMyMeds Key/confirmation #/EOC [facility] Status is pending

## 2024-01-06 ENCOUNTER — Other Ambulatory Visit (HOSPITAL_COMMUNITY): Payer: Self-pay

## 2024-01-06 NOTE — Telephone Encounter (Signed)
 Pharmacy Patient Advocate Encounter  Received notification from [facility] ADVANTAGE/RX ADVANCE that Prior Authorization for Ozempic (0.25 or 0.5 MG/DOSE) 2MG /3ML pen-injectors  has been APPROVED from 01/05/24 to 01/04/25. Unable to obtain price due to refill too soon rejection, last fill date 01/05/24 next available fill date3/27/25   PA #/Case ID/Reference #: [id number]

## 2024-01-23 ENCOUNTER — Other Ambulatory Visit: Payer: PPO

## 2024-01-23 DIAGNOSIS — Z1231 Encounter for screening mammogram for malignant neoplasm of breast: Secondary | ICD-10-CM | POA: Diagnosis not present

## 2024-01-23 LAB — HM MAMMOGRAPHY

## 2024-01-25 ENCOUNTER — Other Ambulatory Visit: Payer: Self-pay | Admitting: Family Medicine

## 2024-01-25 ENCOUNTER — Other Ambulatory Visit: Payer: Self-pay

## 2024-01-25 ENCOUNTER — Other Ambulatory Visit (HOSPITAL_COMMUNITY): Payer: Self-pay

## 2024-01-25 MED ORDER — METFORMIN HCL 500 MG PO TABS
500.0000 mg | ORAL_TABLET | Freq: Two times a day (BID) | ORAL | 0 refills | Status: DC
  Filled 2024-01-25: qty 180, 90d supply, fill #0

## 2024-01-25 MED ORDER — SERTRALINE HCL 50 MG PO TABS
50.0000 mg | ORAL_TABLET | Freq: Every day | ORAL | 0 refills | Status: DC
  Filled 2024-01-25: qty 90, 90d supply, fill #0

## 2024-01-25 MED ORDER — ROSUVASTATIN CALCIUM 40 MG PO TABS
40.0000 mg | ORAL_TABLET | Freq: Every day | ORAL | 0 refills | Status: DC
  Filled 2024-01-25: qty 90, 90d supply, fill #0

## 2024-01-31 ENCOUNTER — Encounter: Payer: PPO | Admitting: Family Medicine

## 2024-02-21 ENCOUNTER — Other Ambulatory Visit: Payer: Self-pay

## 2024-02-23 ENCOUNTER — Telehealth: Admitting: Physician Assistant

## 2024-02-23 ENCOUNTER — Other Ambulatory Visit: Payer: Self-pay

## 2024-02-23 ENCOUNTER — Other Ambulatory Visit (HOSPITAL_BASED_OUTPATIENT_CLINIC_OR_DEPARTMENT_OTHER): Payer: Self-pay

## 2024-02-23 DIAGNOSIS — R058 Other specified cough: Secondary | ICD-10-CM | POA: Diagnosis not present

## 2024-02-23 MED ORDER — ALBUTEROL SULFATE HFA 108 (90 BASE) MCG/ACT IN AERS
2.0000 | INHALATION_SPRAY | Freq: Four times a day (QID) | RESPIRATORY_TRACT | 0 refills | Status: DC | PRN
  Filled 2024-02-23: qty 6.7, 25d supply, fill #0

## 2024-02-23 MED ORDER — BENZONATATE 100 MG PO CAPS
100.0000 mg | ORAL_CAPSULE | Freq: Three times a day (TID) | ORAL | 0 refills | Status: DC | PRN
  Filled 2024-02-23 (×2): qty 30, 10d supply, fill #0

## 2024-02-23 NOTE — Progress Notes (Signed)
 I have spent 5 minutes in review of e-visit questionnaire, review and updating patient chart, medical decision making and response to patient.   Piedad Climes, PA-C

## 2024-02-23 NOTE — Progress Notes (Signed)
 E-Visit for Cough   We are sorry that you are not feeling well.  Here is how we plan to help!  Based on your presentation I believe you most likely have A cough due to allergies.  I recommend that you continue your over-the counter-allergy medication such as Claritin 10 mg or Zyrtec 10 mg daily.     In addition you may use A prescription cough medication called Tessalon  Perles 100mg . You may take 1-2 capsules every 8 hours as needed for your cough.. I have also added on an albuterol  inhaler to help relax your airways and prevent bronchospasm   From your responses in the eVisit questionnaire you describe inflammation in the upper respiratory tract which is causing a significant cough.  This is commonly called Bronchitis and has four common causes:   Allergies Viral Infections Acid Reflux Bacterial Infection Allergies, viruses and acid reflux are treated by controlling symptoms or eliminating the cause. An example might be a cough caused by taking certain blood pressure medications. You stop the cough by changing the medication. Another example might be a cough caused by acid reflux. Controlling the reflux helps control the cough.  USE OF BRONCHODILATOR ("RESCUE") INHALERS: There is a risk from using your bronchodilator too frequently.  The risk is that over-reliance on a medication which only relaxes the muscles surrounding the breathing tubes can reduce the effectiveness of medications prescribed to reduce swelling and congestion of the tubes themselves.  Although you feel brief relief from the bronchodilator inhaler, your asthma may actually be worsening with the tubes becoming more swollen and filled with mucus.  This can delay other crucial treatments, such as oral steroid medications. If you need to use a bronchodilator inhaler daily, several times per day, you should discuss this with your provider.  There are probably better treatments that could be used to keep your asthma under control.      HOME CARE Only take medications as instructed by your medical team. Complete the entire course of an antibiotic. Drink plenty of fluids and get plenty of rest. Avoid close contacts especially the very young and the elderly Cover your mouth if you cough or cough into your sleeve. Always remember to wash your hands A steam or ultrasonic humidifier can help congestion.   GET HELP RIGHT AWAY IF: You develop worsening fever. You become short of breath You cough up blood. Your symptoms persist after you have completed your treatment plan MAKE SURE YOU  Understand these instructions. Will watch your condition. Will get help right away if you are not doing well or get worse.    Thank you for choosing an e-visit.  Your e-visit answers were reviewed by a board certified advanced clinical practitioner to complete your personal care plan. Depending upon the condition, your plan could have included both over the counter or prescription medications.  Please review your pharmacy choice. Make sure the pharmacy is open so you can pick up prescription now. If there is a problem, you may contact your provider through [employer] and have the prescription routed to another pharmacy.  Your safety is important to us . If you have drug allergies check your prescription carefully.   For the next 24 hours you can use MyChart to ask questions about today's visit, request a non-urgent call back, or ask for a work or school excuse. You will get an email in the next two days asking about your experience. I hope that your e-visit has been valuable and will speed  your recovery.

## 2024-02-27 ENCOUNTER — Telehealth: Payer: Self-pay | Admitting: Family Medicine

## 2024-02-27 NOTE — Telephone Encounter (Signed)
 I have this pt scheduled for a lab appt I just need some orders for 02/28/2024

## 2024-02-27 NOTE — Telephone Encounter (Signed)
 Lab orders placed.

## 2024-02-27 NOTE — Addendum Note (Signed)
 Addended by: Shawntae Lowy C on: 02/27/2024 09:49 AM   Modules accepted: Orders

## 2024-02-28 ENCOUNTER — Other Ambulatory Visit (INDEPENDENT_AMBULATORY_CARE_PROVIDER_SITE_OTHER)

## 2024-02-28 DIAGNOSIS — E119 Type 2 diabetes mellitus without complications: Secondary | ICD-10-CM | POA: Diagnosis not present

## 2024-02-28 DIAGNOSIS — H401113 Primary open-angle glaucoma, right eye, severe stage: Secondary | ICD-10-CM | POA: Diagnosis not present

## 2024-02-28 DIAGNOSIS — I10 Essential (primary) hypertension: Secondary | ICD-10-CM | POA: Diagnosis not present

## 2024-02-28 DIAGNOSIS — E782 Mixed hyperlipidemia: Secondary | ICD-10-CM | POA: Diagnosis not present

## 2024-02-28 LAB — CBC WITH DIFFERENTIAL/PLATELET
Basophils Absolute: 0.1 10*3/uL (ref 0.0–0.1)
Basophils Relative: 0.7 % (ref 0.0–3.0)
Eosinophils Absolute: 0.3 10*3/uL (ref 0.0–0.7)
Eosinophils Relative: 2.9 % (ref 0.0–5.0)
HCT: 40.6 % (ref 36.0–46.0)
Hemoglobin: 13.5 g/dL (ref 12.0–15.0)
Lymphocytes Relative: 14.3 % (ref 12.0–46.0)
Lymphs Abs: 1.5 10*3/uL (ref 0.7–4.0)
MCHC: 33.3 g/dL (ref 30.0–36.0)
MCV: 87.2 fl (ref 78.0–100.0)
Monocytes Absolute: 1.2 10*3/uL — ABNORMAL HIGH (ref 0.1–1.0)
Monocytes Relative: 11.2 % (ref 3.0–12.0)
Neutro Abs: 7.5 10*3/uL (ref 1.4–7.7)
Neutrophils Relative %: 70.9 % (ref 43.0–77.0)
Platelets: 405 10*3/uL — ABNORMAL HIGH (ref 150.0–400.0)
RBC: 4.65 Mil/uL (ref 3.87–5.11)
RDW: 13.5 % (ref 11.5–15.5)
WBC: 10.5 10*3/uL (ref 4.0–10.5)

## 2024-02-28 LAB — COMPREHENSIVE METABOLIC PANEL WITH GFR
ALT: 31 U/L (ref 0–35)
AST: 21 U/L (ref 0–37)
Albumin: 4.1 g/dL (ref 3.5–5.2)
Alkaline Phosphatase: 112 U/L (ref 39–117)
BUN: 11 mg/dL (ref 6–23)
CO2: 29 meq/L (ref 19–32)
Calcium: 9.4 mg/dL (ref 8.4–10.5)
Chloride: 102 meq/L (ref 96–112)
Creatinine, Ser: 0.7 mg/dL (ref 0.40–1.20)
GFR: 86.17 mL/min (ref >60.00)
Glucose, Bld: 152 mg/dL — ABNORMAL HIGH (ref 70–99)
Potassium: 4.2 meq/L (ref 3.5–5.1)
Sodium: 139 meq/L (ref 135–145)
Total Bilirubin: 0.6 mg/dL (ref 0.2–1.2)
Total Protein: 6.8 g/dL (ref 6.0–8.3)

## 2024-02-28 LAB — TSH: TSH: 1.92 u[IU]/mL (ref 0.35–5.50)

## 2024-02-28 LAB — LIPID PANEL
Cholesterol: 112 mg/dL (ref 0–200)
HDL: 37.1 mg/dL — ABNORMAL LOW (ref >39.00)
LDL Cholesterol: 52 mg/dL (ref 0–99)
NonHDL: 74.59
Total CHOL/HDL Ratio: 3
Triglycerides: 113 mg/dL (ref 0.0–149.0)
VLDL: 22.6 mg/dL (ref 0.0–40.0)

## 2024-02-28 LAB — HEMOGLOBIN A1C: Hgb A1c MFr Bld: 7.1 % — ABNORMAL HIGH (ref 4.6–6.5)

## 2024-02-28 NOTE — Progress Notes (Signed)
 Pt unable to urinate and lives too far to bring it back.  Pt will collect at upcoming OV on 5/5 with PCP.

## 2024-02-28 NOTE — Addendum Note (Signed)
 Addended by: Susa Engman A on: 02/28/2024 12:01 PM   Modules accepted: Orders

## 2024-02-29 ENCOUNTER — Ambulatory Visit
Admission: RE | Admit: 2024-02-29 | Discharge: 2024-02-29 | Disposition: A | Discharge status: Home / Self Care | Source: Ambulatory Visit | Attending: Family Medicine | Admitting: Family Medicine

## 2024-02-29 ENCOUNTER — Ambulatory Visit (INDEPENDENT_AMBULATORY_CARE_PROVIDER_SITE_OTHER)

## 2024-02-29 ENCOUNTER — Other Ambulatory Visit (HOSPITAL_BASED_OUTPATIENT_CLINIC_OR_DEPARTMENT_OTHER): Payer: Self-pay

## 2024-02-29 ENCOUNTER — Other Ambulatory Visit (HOSPITAL_COMMUNITY): Payer: Self-pay

## 2024-02-29 ENCOUNTER — Other Ambulatory Visit: Payer: Self-pay | Admitting: Family

## 2024-02-29 ENCOUNTER — Encounter: Payer: Self-pay | Admitting: Family Medicine

## 2024-02-29 ENCOUNTER — Other Ambulatory Visit: Payer: Self-pay | Admitting: Family Medicine

## 2024-02-29 ENCOUNTER — Other Ambulatory Visit: Payer: Self-pay

## 2024-02-29 VITALS — BP 133/75 | HR 87 | Temp 98.4°F | Resp 18

## 2024-02-29 DIAGNOSIS — R053 Chronic cough: Secondary | ICD-10-CM

## 2024-02-29 DIAGNOSIS — R911 Solitary pulmonary nodule: Secondary | ICD-10-CM

## 2024-02-29 MED ORDER — PREDNISONE 20 MG PO TABS
40.0000 mg | ORAL_TABLET | Freq: Every day | ORAL | 0 refills | Status: DC
  Filled 2024-02-29: qty 10, 5d supply, fill #0

## 2024-02-29 MED ORDER — LATANOPROST 0.005 % OP SOLN
1.0000 [drp] | Freq: Every day | OPHTHALMIC | 3 refills | Status: AC
  Filled 2024-02-29: qty 10, 100d supply, fill #0
  Filled 2024-05-24: qty 10, 100d supply, fill #1
  Filled 2024-08-24: qty 10, 100d supply, fill #2
  Filled 2024-11-27: qty 10, 100d supply, fill #3

## 2024-02-29 MED ORDER — PROMETHAZINE-DM 6.25-15 MG/5ML PO SYRP
5.0000 mL | ORAL_SOLUTION | Freq: Every evening | ORAL | 0 refills | Status: DC | PRN
  Filled 2024-02-29: qty 100, 20d supply, fill #0

## 2024-02-29 NOTE — Discharge Instructions (Addendum)
 Will call your x-ray results later today. For now maintain Zyrtec daily. Start prednisone  for 5 days. Be careful about diabetes foods. Use cough syrup at bedtime.    For diabetes or elevated blood sugar, please make sure you are limiting and avoiding starchy, carbohydrate foods like pasta, breads, sweet breads, pastry, rice, potatoes, desserts. These foods can elevate your blood sugar. Also, limit and avoid drinks that contain a lot of sugar such as sodas, sweet teas, fruit juices.  Drinking plain water will be much more helpful, try 64 ounces of water daily.  It is okay to flavor your water naturally by cutting cucumber, lemon, mint or lime, placing it in a picture with water and drinking it over a period of 24-48 hours as long as it remains refrigerated.  For elevated blood pressure, make sure you are monitoring salt in your diet.  Do not eat restaurant foods and limit processed foods at home. I highly recommend you prepare and cook your own foods at home.  Processed foods include things like frozen meals, pre-seasoned meats and dinners, deli meats, canned foods as these foods contain a high amount of sodium/salt.  Make sure you are paying attention to sodium labels on foods you buy at the grocery store. Buy your spices separately such as garlic powder, onion powder, cumin, cayenne, parsley flakes so that you can avoid seasonings that contain salt. However, salt-free seasonings are available and can be used, an example is Mrs. Dash and includes a lot of different mixtures that do not contain salt.  Lastly, when cooking using oils that are healthier for you is important. This includes olive oil, avocado oil, canola oil. We have discussed a lot of foods to avoid but below is a list of foods that can be very healthy to use in your diet whether it is for diabetes, cholesterol, high blood pressure, or in general healthy eating.  Salads - kale, spinach, cabbage, spring mix, arugula Fruits - avocadoes, berries  (blueberries, raspberries, blackberries), apples, oranges, pomegranate, grapefruit, kiwi Vegetables - asparagus, cauliflower, broccoli, green beans, brussel sprouts, bell peppers, beets; stay away from or limit starchy vegetables like potatoes, carrots, peas Other general foods - kidney beans, egg whites, almonds, walnuts, sunflower seeds, pumpkin seeds, fat free yogurt, almond milk, flax seeds, quinoa, oats  Meat - It is better to eat lean meats and limit your red meat including pork to once a week.  Wild caught fish, chicken breast are good options as they tend to be leaner sources of good protein. Still be mindful of the sodium labels for the meats you buy.  DO NOT EAT ANY FOODS ON THIS LIST THAT YOU ARE ALLERGIC TO. For more specific needs, I highly recommend consulting a dietician or nutritionist but this can definitely be a good starting point.

## 2024-02-29 NOTE — ED Triage Notes (Signed)
 Pt reports she has a cough with mucus x 1.5 weeks

## 2024-02-29 NOTE — ED Provider Notes (Signed)
 [facility]  Note:  This document was prepared using Conservation officer, historic buildings and may include unintentional dictation errors.  MRN: [id number] DOB: September 03, 1951    Subjective:   Priscilla Butler is a 73 y.o. female presenting for 1.5-week history of persistent dry coughing and occasionally productive.  No fever, sinus symptoms, throat pain, chest pain, shortness of breath or wheezing, chest tightness.  Patient has previously had pneumonia but this feels different to the patient.  Previously she felt worse.  She quit smoking 20 years ago.  No history of asthma or COPD, emphysema.  Patient has type 2 diabetes treated without insulin, last A1c was less than 8%.  Patient would like to trial prednisone  as her son recommended this given that he is a Teacher, early years/pre.  She has previously taken prednisone  before and done well.  No current facility-administered medications for this encounter.  Current Outpatient Medications:    Semaglutide ,0.25 or 0.5MG /DOS, (OZEMPIC , 0.25 OR 0.5 MG/DOSE,) 2 MG/3ML SOPN, Inject 0.5 mg into the skin once a week., Disp: 3 mL, Rfl: 1   albuterol  (VENTOLIN  HFA) 108 (90 Base) MCG/ACT inhaler, Inhale 2 puffs into the lungs every 6 (six) hours as needed for wheezing or shortness of breath., Disp: 6.7 g, Rfl: 0   amLODipine  (NORVASC ) 5 MG tablet, Take 1 tablet (5 mg total) by mouth daily., Disp: 90 tablet, Rfl: 1   aspirin  EC 81 MG tablet, Take 1 tablet (81 mg total) by mouth daily., Disp: 90 tablet, Rfl: 3   benzonatate  (TESSALON ) 100 MG capsule, Take 1 capsule (100 mg total) by mouth 3 (three) times daily as needed for cough., Disp: 30 capsule, Rfl: 0   Black Pepper-Turmeric (TURMERIC COMPLEX/BLACK PEPPER) 3-500 MG CAPS, TAKE 1 CAPSULE BY MOUTH 2 (TWO) TIMES DAILY., Disp: 180 capsule, Rfl: 1   Calcium  Carb-Cholecalciferol  (CALCIUM  + VITAMIN D3) 600-10 MG-MCG TABS, Take 1 tablet by mouth 2 (two) times daily., Disp: 180 tablet, Rfl: 1   cholecalciferol   (VITAMIN D3) 25 MCG (1000 UNIT) tablet, TAKE 1 TABLET BY MOUTH ONCE DAILY, Disp: 100 tablet, Rfl: 1   CRANBERRY PO, Take 84 mg by mouth daily., Disp: , Rfl:    dorzolamide -timolol  (COSOPT ) 2-0.5 % ophthalmic solution, Place 1 drop into both eyes 2 (two) times daily., Disp: 20 mL, Rfl: 3   Fluocinolone  Acetonide 0.01 % OIL, Place 1 drop into the ear as directed 2 times daily as needed for itching, Disp: 20 mL, Rfl: 1   glucose blood (ONETOUCH ULTRA) test strip, USE TO TEST BLOOD SUGAR TWICE A DAY AS DIRECTED, Disp: 200 strip, Rfl: 12   latanoprost  (XALATAN ) 0.005 % ophthalmic solution, Place 1 drop into both eyes at bedtime., Disp: 10 mL, Rfl: 3   latanoprost  (XALATAN ) 0.005 % ophthalmic solution, Place 1 drop into both eyes at bedtime., Disp: 10 mL, Rfl: 3   metFORMIN  (GLUCOPHAGE ) 500 MG tablet, Take 1 tablet (500 mg total) by mouth 2 (two) times daily with a meal., Disp: 180 tablet, Rfl: 0   Multiple Vitamin (MULTIVITAMIN) tablet, Take 1 tablet by mouth daily., Disp: , Rfl:    Omega-3 Fatty Acids (FISH OIL) 1000 MG CAPS, Take 1 capsule by mouth 2 (two) times daily., Disp: , Rfl:    Probiotic Product (PROBIOTIC DAILY) CAPS, Take 1 capsule by mouth daily. 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   DeQuervain's disease (tenosynovitis)    DM (diabetes mellitus), type 2 (HCC)    H/O measles    H/O mumps    History of chicken pox    Hx of TB skin testing    Hyperglycemia    Hyperlipidemia    Menopause     Osteopenia    Otitis externa of left ear 01/30/2017   Preventative health care 01/19/2016   Welcome to Medicare preventive visit 01/27/2017     Past Surgical History:  Procedure Laterality Date   BREAST LUMPECTOMY  03/2008   Right breast   MOHS SURGERY  2013   BCC right upper lip, [place] skin surgery   SKIN SURGERY     facial  skin cancer   WISDOM TOOTH EXTRACTION  1972    Family History  Problem Relation Age of Onset   Breast cancer Maternal Grandmother    Cancer Maternal Grandmother 23       ish   Cancer Maternal Grandfather        lung   Hypertension Mother    Hyperlipidemia Mother    Arthritis Mother    Other Paternal Grandmother     Social History   Tobacco Use   Smoking status: Former    Current packs/day: 0.00    Types: Cigarettes    Quit date: 11/02/1999    Years since quitting: 24.3   Smokeless tobacco: Never  Substance Use Topics   Alcohol use: Yes    Alcohol/week: 3.0 standard drinks of alcohol    Types: 3 Cans of beer per week   Drug use: No    ROS   Objective:   Vitals: BP 133/75 (BP Location: Left Arm)   Pulse 87   Temp 98.4 F (36.9 C) (Oral)   Resp 18   SpO2 94%   Physical Exam Constitutional:      General: She is not in acute distress.    Appearance: Normal appearance. She is well-developed. She is not ill-appearing, toxic-appearing or diaphoretic.  HENT:     Head: Normocephalic and atraumatic.     Nose: Nose normal.     Mouth/Throat:     Mouth: Mucous membranes are moist.     Pharynx: No pharyngeal swelling, oropharyngeal exudate, posterior oropharyngeal erythema or uvula swelling.     Tonsils: No tonsillar exudate or tonsillar abscesses. 0 on the right. 0 on the left.  Eyes:     General: No scleral icterus.       Right eye: No discharge.        Left eye: No discharge.     Extraocular Movements: Extraocular movements intact.  Cardiovascular:     Rate and Rhythm: Normal rate and regular rhythm.     Heart sounds: Normal heart  sounds. No murmur heard.    No friction rub. No gallop.  Pulmonary:     Effort: Pulmonary effort is normal. No respiratory distress.     Breath sounds: No stridor. Rhonchi (trace over mid to lower lung fields bilaterally) present. No wheezing or rales.  Chest:  Chest wall: No tenderness.  Skin:    General: Skin is warm and dry.  Neurological:     General: No focal deficit present.     Mental Status: She is alert and oriented to person, place, and time.  Psychiatric:        Mood and Affect: Mood normal.        Behavior: Behavior normal.    DG Chest 2 View Result Date: 02/29/2024 CLINICAL DATA:  Persistent cough. EXAM: CHEST - 2 VIEW COMPARISON:  11/27/2014 FINDINGS: The heart size and mediastinal contours are within normal limits. No evidence of pulmonary infiltrate or pleural effusion. A new pulmonary nodule is seen in the left upper lobe measuring approximately 1.5 cm, suspicious for lung carcinoma. IMPRESSION: New 1.5 cm left upper lobe pulmonary nodule, suspicious for lung carcinoma. Chest CT is recommended for further evaluation. Electronically Signed   By: Marlyce Sine M.D.   On: 02/29/2024 14:07   Assessment and Plan :   PDMP not reviewed this encounter.  1. Persistent cough   2. Solitary pulmonary nodule    I was agreeable to providing patient with an oral prednisone  course to help as a respiratory anti-inflammatory and suppress her cough.  Will defer antibiotic use given no sign of pneumonia on the chest x-ray.  I did discuss in detail with patient the x-ray finding of a 1.5 cm left upper lobe pulmonary nodule.  Emphasized the need for urgent follow-up with her PCP to rule out malignancy.  Will defer ER visit as I have low suspicion that there is an acute pulmonary process given hemodynamically stable vital signs.  Counseled patient on potential for adverse effects with medications prescribed/recommended today, ER and return-to-clinic precautions discussed, patient verbalized  understanding.    Adolph Hoop, [place] 02/29/24 [id number]

## 2024-03-01 ENCOUNTER — Ambulatory Visit (HOSPITAL_BASED_OUTPATIENT_CLINIC_OR_DEPARTMENT_OTHER)
Admission: RE | Admit: 2024-03-01 | Discharge: 2024-03-01 | Disposition: A | Discharge status: Home / Self Care | Source: Ambulatory Visit | Attending: Family Medicine | Admitting: Family Medicine

## 2024-03-01 ENCOUNTER — Encounter (HOSPITAL_BASED_OUTPATIENT_CLINIC_OR_DEPARTMENT_OTHER): Payer: Self-pay | Admitting: *Deleted

## 2024-03-01 ENCOUNTER — Other Ambulatory Visit (HOSPITAL_COMMUNITY): Payer: Self-pay

## 2024-03-01 DIAGNOSIS — R911 Solitary pulmonary nodule: Secondary | ICD-10-CM | POA: Diagnosis not present

## 2024-03-01 DIAGNOSIS — R918 Other nonspecific abnormal finding of lung field: Secondary | ICD-10-CM | POA: Diagnosis not present

## 2024-03-01 MED ORDER — IOHEXOL 300 MG/ML  SOLN
80.0000 mL | Freq: Once | INTRAMUSCULAR | Status: AC | PRN
  Administered 2024-03-01: 80 mL via INTRAVENOUS

## 2024-03-02 ENCOUNTER — Other Ambulatory Visit (HOSPITAL_COMMUNITY): Payer: Self-pay

## 2024-03-04 DIAGNOSIS — R918 Other nonspecific abnormal finding of lung field: Secondary | ICD-10-CM | POA: Insufficient documentation

## 2024-03-04 DIAGNOSIS — R911 Solitary pulmonary nodule: Secondary | ICD-10-CM | POA: Insufficient documentation

## 2024-03-04 NOTE — Assessment & Plan Note (Signed)
 Encouraged to get adequate exercise, calcium and vitamin d intake

## 2024-03-04 NOTE — Assessment & Plan Note (Signed)
 Well controlled, no changes to meds. Encouraged heart healthy diet such as the DASH diet and exercise as tolerated.

## 2024-03-04 NOTE — Assessment & Plan Note (Signed)
 Patient encouraged to maintain heart healthy diet, regular exercise, adequate sleep. Consider daily probiotics. Take medications as prescribed. Labs reviewed and ordered. Has ACP documents in chart. Last colonoscopy June 2017 repeat in 2027, MGM March 2025, repeat next year. Dexa March 2022 repeat between 2025 and 2027. Pap May 2022 repeat in 2025.

## 2024-03-04 NOTE — Assessment & Plan Note (Signed)
 Found on recent imaging, awaiting CT scan for further diagnostic clarification

## 2024-03-04 NOTE — Assessment & Plan Note (Signed)
hgba1c elevated, minimize simple carbs. Increase exercise as tolerated. Continue current meds including and titrate up as needed and as tolerated.

## 2024-03-04 NOTE — Assessment & Plan Note (Signed)
 Tolerating statin, encouraged heart healthy diet, avoid trans fats, minimize simple carbs and saturated fats. Increase exercise as tolerated

## 2024-03-05 ENCOUNTER — Encounter: Payer: Self-pay | Admitting: Family Medicine

## 2024-03-05 ENCOUNTER — Telehealth: Payer: Self-pay | Admitting: Emergency Medicine

## 2024-03-05 ENCOUNTER — Ambulatory Visit (INDEPENDENT_AMBULATORY_CARE_PROVIDER_SITE_OTHER): Payer: PPO | Admitting: Family Medicine

## 2024-03-05 VITALS — BP 128/74 | HR 78 | Resp 16 | Ht 62.0 in | Wt 152.0 lb

## 2024-03-05 DIAGNOSIS — I10 Essential (primary) hypertension: Secondary | ICD-10-CM | POA: Diagnosis not present

## 2024-03-05 DIAGNOSIS — H9313 Tinnitus, bilateral: Secondary | ICD-10-CM | POA: Insufficient documentation

## 2024-03-05 DIAGNOSIS — M8580 Other specified disorders of bone density and structure, unspecified site: Secondary | ICD-10-CM | POA: Diagnosis not present

## 2024-03-05 DIAGNOSIS — E782 Mixed hyperlipidemia: Secondary | ICD-10-CM | POA: Diagnosis not present

## 2024-03-05 DIAGNOSIS — Z0000 Encounter for general adult medical examination without abnormal findings: Secondary | ICD-10-CM

## 2024-03-05 DIAGNOSIS — E119 Type 2 diabetes mellitus without complications: Secondary | ICD-10-CM | POA: Diagnosis not present

## 2024-03-05 DIAGNOSIS — R911 Solitary pulmonary nodule: Secondary | ICD-10-CM

## 2024-03-05 NOTE — Telephone Encounter (Signed)
 Patient needs to be seen in office for new pulmonary nodule on CT scan of the chest, nodule slot or blocked slot next available.  Thank you very much
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
## 2024-03-05 NOTE — Patient Instructions (Addendum)
 [facility] aud[facility] for hearing evaluation  Preventive Care 43 Years and Older, Female Preventive care refers to lifestyle choices and visits with your health care provider that can promote health and wellness. Preventive care visits are also called wellness exams. What can I expect for my preventive care visit? Counseling Your health care provider may ask you questions about your: Medical history, including: Past medical problems. Family medical history. Pregnancy and menstrual history. History of falls. Current health, including: Memory and ability to understand (cognition). Emotional well-being. Home life and relationship well-being. Sexual activity and sexual health. Lifestyle, including: Alcohol, nicotine or tobacco, and drug use. Access to firearms. Diet, exercise, and sleep habits. Work and work Astronomer. Sunscreen use. Safety issues such as seatbelt and bike helmet use. Physical exam Your health care provider will check your: Height and weight. These may be used to calculate your BMI (body mass index). BMI is a measurement that tells if you are at a healthy weight. Waist circumference. This measures the distance around your waistline. This measurement also tells if you are at a healthy weight and may help predict your risk of certain diseases, such as type 2 diabetes and high blood pressure. Heart rate and blood pressure. Body temperature. Skin for abnormal spots. What immunizations do I need?  Vaccines are usually given at various ages, according to a schedule. Your health care provider will recommend vaccines for you based on your age, medical history, and lifestyle or other factors, such as travel or where you work. What tests do I need? Screening Your health care provider may recommend screening tests for certain conditions. This may include: Lipid and cholesterol levels. Hepatitis C test. Hepatitis B test. HIV (human immunodeficiency virus) test. STI (sexually  transmitted infection) testing, if you are at risk. Lung cancer screening. Colorectal cancer screening. Diabetes screening. This is done by checking your blood sugar (glucose) after you have not eaten for a while (fasting). Mammogram. Talk with your health care provider about how often you should have regular mammograms. BRCA-related cancer screening. This may be done if you have a family history of breast, ovarian, tubal, or peritoneal cancers. Bone density scan. This is done to screen for osteoporosis. Talk with your health care provider about your test results, treatment options, and if necessary, the need for more tests. Follow these instructions at home: Eating and drinking  Eat a diet that includes fresh fruits and vegetables, whole grains, lean protein, and low-fat dairy products. Limit your intake of foods with high amounts of sugar, saturated fats, and salt. Take vitamin and mineral supplements as recommended by your health care provider. Do not drink alcohol if your health care provider tells you not to drink. If you drink alcohol: Limit how much you have to 0-1 drink a day. Know how much alcohol is in your drink. In the [place] one drink equals one 12 oz bottle of beer (355 mL), one 5 oz glass of wine (148 mL), or one 1 oz glass of hard liquor (44 mL). Lifestyle Brush your teeth every morning and night with fluoride toothpaste. Floss one time each day. Exercise for at least 30 minutes 5 or more days each week. Do not use any products that contain nicotine or tobacco. These products include cigarettes, chewing tobacco, and vaping devices, such as e-cigarettes. If you need help quitting, ask your health care provider. Do not use drugs. If you are sexually active, practice safe sex. Use a condom or other form of protection in order to prevent STIs.  Take aspirin  only as told by your health care provider. Make sure that you understand how much to take and what form to take. Work with your  health care provider to find out whether it is safe and beneficial for you to take aspirin  daily. Ask your health care provider if you need to take a cholesterol-lowering medicine (statin). Find healthy ways to manage stress, such as: Meditation, yoga, or listening to music. Journaling. Talking to a trusted person. Spending time with friends and family. Minimize exposure to UV radiation to reduce your risk of skin cancer. Safety Always wear your seat belt while driving or riding in a vehicle. Do not drive: If you have been drinking alcohol. Do not ride with someone who has been drinking. When you are tired or distracted. While texting. If you have been using any mind-altering substances or drugs. Wear a helmet and other protective equipment during sports activities. If you have firearms in your house, make sure you follow all gun safety procedures. What's next? Visit your health care provider once a year for an annual wellness visit. Ask your health care provider how often you should have your eyes and teeth checked. Stay up to date on all vaccines. This information is not intended to replace advice given to you by your health care provider. Make sure you discuss any questions you have with your health care provider. Document Revised: 04/15/2021 Document Reviewed: 04/15/2021 Elsevier Patient Education  2024 [employer].

## 2024-03-05 NOTE — Telephone Encounter (Signed)
 Spoke with Atheena regarding RB note. Pt scheduled 5/21 & is aware. Nothing further needed

## 2024-03-05 NOTE — Assessment & Plan Note (Signed)
 Consider hearing evaluation, is still intermittent

## 2024-03-06 ENCOUNTER — Other Ambulatory Visit (HOSPITAL_COMMUNITY): Payer: Self-pay

## 2024-03-06 LAB — MICROALBUMIN / CREATININE URINE RATIO
Creatinine,U: 83.6 mg/dL
Microalb Creat Ratio: 8.9 mg/g (ref 0.0–30.0)
Microalb, Ur: 0.7 mg/dL (ref 0.0–1.9)

## 2024-03-07 ENCOUNTER — Other Ambulatory Visit: Payer: Self-pay

## 2024-03-07 ENCOUNTER — Encounter: Payer: Self-pay | Admitting: Family Medicine

## 2024-03-07 ENCOUNTER — Other Ambulatory Visit (HOSPITAL_COMMUNITY): Payer: Self-pay

## 2024-03-07 ENCOUNTER — Other Ambulatory Visit: Payer: Self-pay | Admitting: Family

## 2024-03-07 ENCOUNTER — Other Ambulatory Visit: Payer: Self-pay | Admitting: Family Medicine

## 2024-03-07 MED ORDER — OZEMPIC (0.25 OR 0.5 MG/DOSE) 2 MG/3ML ~~LOC~~ SOPN
0.5000 mg | PEN_INJECTOR | SUBCUTANEOUS | 1 refills | Status: DC
  Filled 2024-03-07: qty 3, 28d supply, fill #0
  Filled 2024-04-24: qty 3, 28d supply, fill #1

## 2024-03-12 ENCOUNTER — Other Ambulatory Visit (HOSPITAL_COMMUNITY): Payer: Self-pay

## 2024-03-12 ENCOUNTER — Encounter: Payer: Self-pay | Admitting: Family Medicine

## 2024-03-14 ENCOUNTER — Other Ambulatory Visit: Payer: Self-pay | Admitting: Family Medicine

## 2024-03-14 DIAGNOSIS — N6310 Unspecified lump in the right breast, unspecified quadrant: Secondary | ICD-10-CM

## 2024-03-21 ENCOUNTER — Ambulatory Visit: Admitting: Emergency Medicine

## 2024-03-21 ENCOUNTER — Other Ambulatory Visit: Payer: Self-pay | Admitting: Family Medicine

## 2024-03-21 ENCOUNTER — Encounter: Payer: Self-pay | Admitting: Emergency Medicine

## 2024-03-21 VITALS — BP 126/80 | HR 85 | Ht 62.5 in | Wt 151.4 lb

## 2024-03-21 DIAGNOSIS — E119 Type 2 diabetes mellitus without complications: Secondary | ICD-10-CM

## 2024-03-21 DIAGNOSIS — Z87891 Personal history of nicotine dependence: Secondary | ICD-10-CM | POA: Diagnosis not present

## 2024-03-21 DIAGNOSIS — I25119 Atherosclerotic heart disease of native coronary artery with unspecified angina pectoris: Secondary | ICD-10-CM

## 2024-03-21 DIAGNOSIS — R918 Other nonspecific abnormal finding of lung field: Secondary | ICD-10-CM

## 2024-03-21 DIAGNOSIS — Z853 Personal history of malignant neoplasm of breast: Secondary | ICD-10-CM

## 2024-03-21 DIAGNOSIS — I10 Essential (primary) hypertension: Secondary | ICD-10-CM

## 2024-03-21 NOTE — H&P (View-Only) (Signed)
 Subjective:    Patient ID: Priscilla Butler, female    DOB: January 28, 1951, 73 y.o.   MRN: [id number]  HPI Priscilla Butler is a 73 year old female with a history of remote stage one breast cancer who presents for evaluation of a pulmonary nodule found on imaging. She was referred by Doctor Nathalie Baize for evaluation of a pulmonary nodule found during imaging for a cough. Her cough is improved and she denies any respiratory symptoms.  No chest pain, no wheezing no tightness.  She describes the cough to allergies.  She was initially evaluated for a persistent cough, which led to a chest x-ray revealing a pulmonary nodule. A subsequent CT scan of the chest was performed on Mar 01, 2024, to further investigate the findings.  The CT scan showed an irregular posterior left upper lobe nodule measuring 1.7 by 1.2 centimeters with some central lucency. Additionally, there is an adjacent subpleural ground glass nodule measuring 1.2 by 1.1 centimeters and multifocal tree-in-bud nodularity, including in the right middle lobe, left upper lobe, and left lower lobe. No mediastinal or hilar adenopathy was noted.  She has a history of remote stage one breast cancer, diabetes, and hypertension. She is a former smoker, approximately 20-25 pack years.  She recalls being hospitalized as a youth for tuberculosis but she is unclear whether this was a latent TB or whether she was actually treated for active TB.   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   DeQuervain's disease (tenosynovitis)    DM (diabetes mellitus), type 2 (HCC)    H/O measles    H/O mumps    History of chicken pox    Hx of TB skin testing    Hyperglycemia    Hyperlipidemia    Menopause    Osteopenia    Otitis externa of left ear 01/30/2017   Preventative health care 01/19/2016   Welcome to Medicare preventive visit  01/27/2017     Family History  Problem Relation Age of Onset   Breast cancer Maternal Grandmother    Cancer Maternal Grandmother 41       ish   Cancer Maternal Grandfather        lung   Hypertension Mother    Hyperlipidemia Mother    Arthritis Mother    Other Paternal Grandmother      Social History   Socioeconomic History   Marital status: Married    Spouse name: Ardelle Kos   Number of children: 1   Years of education: BA   Highest education level: Bachelor's degree (e.g., BA, AB, BS)  Occupational History   Occupation: Retired    Associate Professor: UNEMPLOYED  Tobacco Use   Smoking status: Former    Current packs/day: 0.00    Types: Cigarettes    Quit date: 11/02/1999    Years since quitting: 24.4   Smokeless tobacco: Never  Substance and Sexual Activity   Alcohol use: Yes    Alcohol/week: 3.0 standard drinks of alcohol    Types: 3 Cans of beer per week   Drug use: No   Sexual activity: Yes    Partners: Male    Birth control/protection: Post-menopausal    Comment: lives with husband, retired from Audiological scientist, Engineer, agricultural, No dietary restrictions  Other Topics Concern   Not on file  Social History Narrative   Not on  file   Social Drivers of Health   Financial Resource Strain: Low Risk  (07/25/2023)   Overall Financial Resource Strain (CARDIA)    Difficulty of Paying Living Expenses: Not hard at all  Food Insecurity: No Food Insecurity (07/25/2023)   Hunger Vital Sign    Worried About Running Out of Food in the Last Year: Never true    Ran Out of Food in the Last Year: Never true  Transportation Needs: No Transportation Needs (07/25/2023)   PRAPARE - Administrator, Civil Service (Medical): No    Lack of Transportation (Non-Medical): No  Physical Activity: Insufficiently Active (07/25/2023)   Exercise Vital Sign    Days of Exercise per Week: 4 days    Minutes of Exercise per Session: 30 min  Stress: No Stress Concern Present (07/25/2023)   [employer] of  Occupational Health - Occupational Stress Questionnaire    Feeling of Stress : Only a little  Social Connections: Moderately Isolated (07/25/2023)   Social Connection and Isolation Panel [NHANES]    Frequency of Communication with Friends and Family: Once a week    Frequency of Social Gatherings with Friends and Family: Once a week    Attends Religious Services: 1 to 4 times per year    Active Member of [employer] or Organizations: No    Attends Engineer, structural: Not on file    Marital Status: Married  Catering manager Violence: Not At Risk (08/25/2022)   Humiliation, Afraid, Rape, and Kick questionnaire    Fear of Current or Ex-Partner: No    Emotionally Abused: No    Physically Abused: No    Sexually Abused: No     Allergies  Allergen Reactions   Lisinopril  Cough     Outpatient Medications Prior to Visit  Medication Sig Dispense Refill   amLODipine  (NORVASC ) 5 MG tablet Take 1 tablet (5 mg total) by mouth daily. 90 tablet 1   aspirin  EC 81 MG tablet Take 1 tablet (81 mg total) by mouth daily. 90 tablet 3   Black Pepper-Turmeric (TURMERIC COMPLEX/BLACK PEPPER) 3-500 MG CAPS TAKE 1 CAPSULE BY MOUTH 2 (TWO) TIMES DAILY. 180 capsule 1   Calcium  Carb-Cholecalciferol  (CALCIUM  + VITAMIN D3) 600-10 MG-MCG TABS Take 1 tablet by mouth 2 (two) times daily. 180 tablet 1   cholecalciferol  (VITAMIN D3) 25 MCG (1000 UNIT) tablet TAKE 1 TABLET BY MOUTH ONCE DAILY 100 tablet 1   CRANBERRY PO Take 84 mg by mouth daily.     dorzolamide -timolol  (COSOPT ) 2-0.5 % ophthalmic solution Place 1 drop into both eyes 2 (two) times daily. 20 mL 3   Fluocinolone  Acetonide 0.01 % OIL Place 1 drop into the ear as directed 2 times daily as needed for itching 20 mL 1   glucose blood (ONETOUCH ULTRA) test strip USE TO TEST BLOOD SUGAR TWICE A DAY AS DIRECTED 200 strip 12   latanoprost  (XALATAN ) 0.005 % ophthalmic solution Place 1 drop into both eyes at bedtime. 10 mL 3   metFORMIN  (GLUCOPHAGE ) 500 MG tablet  Take 1 tablet (500 mg total) by mouth 2 (two) times daily with a meal. 180 tablet 0   Multiple Vitamin (MULTIVITAMIN) tablet Take 1 tablet by mouth daily.     Omega-3 Fatty Acids (FISH OIL) 1000 MG CAPS Take 1 capsule by mouth 2 (two) times daily.     Probiotic Product (PROBIOTIC DAILY) CAPS Take 1 capsule by mouth daily. NOW company     Psyllium (METAMUCIL SMOOTH TEXTURE) 28.3 % POWD  1 tsp daily in water     rosuvastatin  (CRESTOR ) 40 MG tablet Take 1 tablet (40 mg total) by mouth daily. 90 tablet 0   Semaglutide ,0.25 or 0.5MG /DOS, (OZEMPIC , 0.25 OR 0.5 MG/DOSE,) 2 MG/3ML SOPN Inject 0.5 mg into the skin once a week. 3 mL 1   sertraline  (ZOLOFT ) 50 MG tablet Take 1 tablet (50 mg total) by mouth daily. 90 tablet 0   promethazine -dextromethorphan (PROMETHAZINE -DM) 6.25-15 MG/5ML syrup Take 5 mLs by mouth at bedtime as needed for cough. (Patient not taking: Reported on 03/21/2024) 100 mL 0   No facility-administered medications prior to visit.        Objective:   Physical Exam  Vitals:   03/21/24 1605  BP: 126/80  Pulse: 85  SpO2: 97%  Weight: 151 lb 6.4 oz (68.7 kg)  Height: 5' 2.5" (1.588 m)   Gen: Pleasant, well-nourished, in no distress,  normal affect  ENT: No lesions,  mouth clear,  oropharynx clear, no postnasal drip  Neck: No JVD, no stridor  Lungs: No use of accessory muscles, no crackles or wheezing on normal respiration, no wheeze on forced expiration  Cardiovascular: RRR, heart sounds normal, no murmur or gallops, no peripheral edema  Musculoskeletal: No deformities, no cyanosis or clubbing  Neuro: alert, awake, non focal  Skin: Warm, no lesions or rash      Assessment & Plan:   Pulmonary nodules Abnormal CT chest with scattered bilateral nodules, most of these are punctate and tree-in-bud and suggestive of an inflammatory process.  There is a larger irregular left upper lobe solid focus as well as an adjacent groundglass rounded nodular opacity.  Etiology  unclear.  I did discuss the differential diagnosis with her that includes benign findings versus either infectious or noninfectious inflammatory process.  Also discussed the possibility of malignancy.  She is at moderate risk given her own tobacco history.  Further she has a history of breast cancer and is undergoing evaluation for a new breast finding.  We discussed conservative management with a repeat CT versus PET scan versus upfront bronchoscopy to achieve a tissue diagnosis and culture data.  We have decided to pursue bronchoscopy.  I will work on setting up navigational bronchoscopy as soon as possible, probably on 04/03/2024.  Suspicion for TB in this setting is low.  I will check a QuantiFERON gold to confirm the history.  We reviewed your CT scan of the chest today. We will arrange for lab work in our office prior to your procedure We will arrange for navigational bronchoscopy to evaluate pulmonary nodules seen on your CT scan.  This will be done as an outpatient at [facility] under general anesthesia.  You will need a designated driver and someone to watch you that day after the procedure.  You need to be off your Ozempic  for at least a week and off aspirin  for at least 2 days. Will arrange follow-up for you in our office about 1 week after the procedure to review preliminary results.       Racheal Buddle, MD, PhD 03/21/2024, 5:13 PM [facility] [phone] or if no answer before 7:00PM call [phone] For any issues after 7:00PM please call eLink [phone]

## 2024-03-21 NOTE — Telephone Encounter (Signed)
 I don't see a referral to Cardiology in the system but it was mentioned in the note. Pended. Complete if appropriate.

## 2024-03-21 NOTE — Assessment & Plan Note (Signed)
 Abnormal CT chest with scattered bilateral nodules, most of these are punctate and tree-in-bud and suggestive of an inflammatory process.  There is a larger irregular left upper lobe solid focus as well as an adjacent groundglass rounded nodular opacity.  Etiology unclear.  I did discuss the differential diagnosis with her that includes benign findings versus either infectious or noninfectious inflammatory process.  Also discussed the possibility of malignancy.  She is at moderate risk given her own tobacco history.  Further she has a history of breast cancer and is undergoing evaluation for a new breast finding.  We discussed conservative management with a repeat CT versus PET scan versus upfront bronchoscopy to achieve a tissue diagnosis and culture data.  We have decided to pursue bronchoscopy.  I will work on setting up navigational bronchoscopy as soon as possible, probably on 04/03/2024.  Suspicion for TB in this setting is low.  I will check a QuantiFERON gold to confirm the history.  We reviewed your CT scan of the chest today. We will arrange for lab work in our office prior to your procedure We will arrange for navigational bronchoscopy to evaluate pulmonary nodules seen on your CT scan.  This will be done as an outpatient at [place] [facility] under general anesthesia.  You will need a designated driver and someone to watch you that day after the procedure.  You need to be off your Ozempic  for at least a week and off aspirin  for at least 2 days. Will arrange follow-up for you in our office about 1 week after the procedure to review preliminary results.

## 2024-03-21 NOTE — Patient Instructions (Signed)
 We reviewed your CT scan of the chest today. We will arrange for lab work in our office prior to your procedure We will arrange for navigational bronchoscopy to evaluate pulmonary nodules seen on your CT scan.  This will be done as an outpatient at [place] [facility] under general anesthesia.  You will need a designated driver and someone to watch you that day after the procedure.  You need to be off your Ozempic  for at least a week and off aspirin  for at least 2 days. Will arrange follow-up for you in our office about 1 week after the procedure to review preliminary results.

## 2024-03-21 NOTE — Progress Notes (Signed)
 Subjective:    Patient ID: Priscilla Butler, female    DOB: January 28, 1951, 73 y.o.   MRN: [id number]  HPI Priscilla Butler is a 73 year old female with a history of remote stage one breast cancer who presents for evaluation of a pulmonary nodule found on imaging. She was referred by Doctor Nathalie Baize for evaluation of a pulmonary nodule found during imaging for a cough. Her cough is improved and she denies any respiratory symptoms.  No chest pain, no wheezing no tightness.  She describes the cough to allergies.  She was initially evaluated for a persistent cough, which led to a chest x-ray revealing a pulmonary nodule. A subsequent CT scan of the chest was performed on Mar 01, 2024, to further investigate the findings.  The CT scan showed an irregular posterior left upper lobe nodule measuring 1.7 by 1.2 centimeters with some central lucency. Additionally, there is an adjacent subpleural ground glass nodule measuring 1.2 by 1.1 centimeters and multifocal tree-in-bud nodularity, including in the right middle lobe, left upper lobe, and left lower lobe. No mediastinal or hilar adenopathy was noted.  She has a history of remote stage one breast cancer, diabetes, and hypertension. She is a former smoker, approximately 20-25 pack years.  She recalls being hospitalized as a youth for tuberculosis but she is unclear whether this was a latent TB or whether she was actually treated for active TB.   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   DeQuervain's disease (tenosynovitis)    DM (diabetes mellitus), type 2 (HCC)    H/O measles    H/O mumps    History of chicken pox    Hx of TB skin testing    Hyperglycemia    Hyperlipidemia    Menopause    Osteopenia    Otitis externa of left ear 01/30/2017   Preventative health care 01/19/2016   Welcome to Medicare preventive visit  01/27/2017     Family History  Problem Relation Age of Onset   Breast cancer Maternal Grandmother    Cancer Maternal Grandmother 41       ish   Cancer Maternal Grandfather        lung   Hypertension Mother    Hyperlipidemia Mother    Arthritis Mother    Other Paternal Grandmother      Social History   Socioeconomic History   Marital status: Married    Spouse name: Ardelle Kos   Number of children: 1   Years of education: BA   Highest education level: Bachelor's degree (e.g., BA, AB, BS)  Occupational History   Occupation: Retired    Associate Professor: UNEMPLOYED  Tobacco Use   Smoking status: Former    Current packs/day: 0.00    Types: Cigarettes    Quit date: 11/02/1999    Years since quitting: 24.4   Smokeless tobacco: Never  Substance and Sexual Activity   Alcohol use: Yes    Alcohol/week: 3.0 standard drinks of alcohol    Types: 3 Cans of beer per week   Drug use: No   Sexual activity: Yes    Partners: Male    Birth control/protection: Post-menopausal    Comment: lives with husband, retired from Audiological scientist, Engineer, agricultural, No dietary restrictions  Other Topics Concern   Not on file  Social History Narrative   Not on  file   Social Drivers of Health   Financial Resource Strain: Low Risk  (07/25/2023)   Overall Financial Resource Strain (CARDIA)    Difficulty of Paying Living Expenses: Not hard at all  Food Insecurity: No Food Insecurity (07/25/2023)   Hunger Vital Sign    Worried About Running Out of Food in the Last Year: Never true    Ran Out of Food in the Last Year: Never true  Transportation Needs: No Transportation Needs (07/25/2023)   PRAPARE - Administrator, Civil Service (Medical): No    Lack of Transportation (Non-Medical): No  Physical Activity: Insufficiently Active (07/25/2023)   Exercise Vital Sign    Days of Exercise per Week: 4 days    Minutes of Exercise per Session: 30 min  Stress: No Stress Concern Present (07/25/2023)   [employer] of  Occupational Health - Occupational Stress Questionnaire    Feeling of Stress : Only a little  Social Connections: Moderately Isolated (07/25/2023)   Social Connection and Isolation Panel [NHANES]    Frequency of Communication with Friends and Family: Once a week    Frequency of Social Gatherings with Friends and Family: Once a week    Attends Religious Services: 1 to 4 times per year    Active Member of [employer] or Organizations: No    Attends Engineer, structural: Not on file    Marital Status: Married  Catering manager Violence: Not At Risk (08/25/2022)   Humiliation, Afraid, Rape, and Kick questionnaire    Fear of Current or Ex-Partner: No    Emotionally Abused: No    Physically Abused: No    Sexually Abused: No     Allergies  Allergen Reactions   Lisinopril  Cough     Outpatient Medications Prior to Visit  Medication Sig Dispense Refill   amLODipine  (NORVASC ) 5 MG tablet Take 1 tablet (5 mg total) by mouth daily. 90 tablet 1   aspirin  EC 81 MG tablet Take 1 tablet (81 mg total) by mouth daily. 90 tablet 3   Black Pepper-Turmeric (TURMERIC COMPLEX/BLACK PEPPER) 3-500 MG CAPS TAKE 1 CAPSULE BY MOUTH 2 (TWO) TIMES DAILY. 180 capsule 1   Calcium  Carb-Cholecalciferol  (CALCIUM  + VITAMIN D3) 600-10 MG-MCG TABS Take 1 tablet by mouth 2 (two) times daily. 180 tablet 1   cholecalciferol  (VITAMIN D3) 25 MCG (1000 UNIT) tablet TAKE 1 TABLET BY MOUTH ONCE DAILY 100 tablet 1   CRANBERRY PO Take 84 mg by mouth daily.     dorzolamide -timolol  (COSOPT ) 2-0.5 % ophthalmic solution Place 1 drop into both eyes 2 (two) times daily. 20 mL 3   Fluocinolone  Acetonide 0.01 % OIL Place 1 drop into the ear as directed 2 times daily as needed for itching 20 mL 1   glucose blood (ONETOUCH ULTRA) test strip USE TO TEST BLOOD SUGAR TWICE A DAY AS DIRECTED 200 strip 12   latanoprost  (XALATAN ) 0.005 % ophthalmic solution Place 1 drop into both eyes at bedtime. 10 mL 3   metFORMIN  (GLUCOPHAGE ) 500 MG tablet  Take 1 tablet (500 mg total) by mouth 2 (two) times daily with a meal. 180 tablet 0   Multiple Vitamin (MULTIVITAMIN) tablet Take 1 tablet by mouth daily.     Omega-3 Fatty Acids (FISH OIL) 1000 MG CAPS Take 1 capsule by mouth 2 (two) times daily.     Probiotic Product (PROBIOTIC DAILY) CAPS Take 1 capsule by mouth daily. NOW company     Psyllium (METAMUCIL SMOOTH TEXTURE) 28.3 % POWD  1 tsp daily in water     rosuvastatin  (CRESTOR ) 40 MG tablet Take 1 tablet (40 mg total) by mouth daily. 90 tablet 0   Semaglutide ,0.25 or 0.5MG /DOS, (OZEMPIC , 0.25 OR 0.5 MG/DOSE,) 2 MG/3ML SOPN Inject 0.5 mg into the skin once a week. 3 mL 1   sertraline  (ZOLOFT ) 50 MG tablet Take 1 tablet (50 mg total) by mouth daily. 90 tablet 0   promethazine -dextromethorphan (PROMETHAZINE -DM) 6.25-15 MG/5ML syrup Take 5 mLs by mouth at bedtime as needed for cough. (Patient not taking: Reported on 03/21/2024) 100 mL 0   No facility-administered medications prior to visit.        Objective:   Physical Exam  Vitals:   03/21/24 1605  BP: 126/80  Pulse: 85  SpO2: 97%  Weight: 151 lb 6.4 oz (68.7 kg)  Height: 5' 2.5" (1.588 m)   Gen: Pleasant, well-nourished, in no distress,  normal affect  ENT: No lesions,  mouth clear,  oropharynx clear, no postnasal drip  Neck: No JVD, no stridor  Lungs: No use of accessory muscles, no crackles or wheezing on normal respiration, no wheeze on forced expiration  Cardiovascular: RRR, heart sounds normal, no murmur or gallops, no peripheral edema  Musculoskeletal: No deformities, no cyanosis or clubbing  Neuro: alert, awake, non focal  Skin: Warm, no lesions or rash      Assessment & Plan:   Pulmonary nodules Abnormal CT chest with scattered bilateral nodules, most of these are punctate and tree-in-bud and suggestive of an inflammatory process.  There is a larger irregular left upper lobe solid focus as well as an adjacent groundglass rounded nodular opacity.  Etiology  unclear.  I did discuss the differential diagnosis with her that includes benign findings versus either infectious or noninfectious inflammatory process.  Also discussed the possibility of malignancy.  She is at moderate risk given her own tobacco history.  Further she has a history of breast cancer and is undergoing evaluation for a new breast finding.  We discussed conservative management with a repeat CT versus PET scan versus upfront bronchoscopy to achieve a tissue diagnosis and culture data.  We have decided to pursue bronchoscopy.  I will work on setting up navigational bronchoscopy as soon as possible, probably on 04/03/2024.  Suspicion for TB in this setting is low.  I will check a QuantiFERON gold to confirm the history.  We reviewed your CT scan of the chest today. We will arrange for lab work in our office prior to your procedure We will arrange for navigational bronchoscopy to evaluate pulmonary nodules seen on your CT scan.  This will be done as an outpatient at [facility] under general anesthesia.  You will need a designated driver and someone to watch you that day after the procedure.  You need to be off your Ozempic  for at least a week and off aspirin  for at least 2 days. Will arrange follow-up for you in our office about 1 week after the procedure to review preliminary results.       Racheal Buddle, MD, PhD 03/21/2024, 5:13 PM [facility] [phone] or if no answer before 7:00PM call [phone] For any issues after 7:00PM please call eLink [phone]

## 2024-03-22 ENCOUNTER — Encounter: Payer: Self-pay | Admitting: Emergency Medicine

## 2024-03-22 DIAGNOSIS — R918 Other nonspecific abnormal finding of lung field: Secondary | ICD-10-CM | POA: Diagnosis not present

## 2024-03-26 LAB — QUANTIFERON-TB GOLD PLUS
Mitogen-NIL: 6.75 [IU]/mL
NIL: 0.01 [IU]/mL
QuantiFERON-TB Gold Plus: NEGATIVE
TB1-NIL: 0 [IU]/mL
TB2-NIL: 0 [IU]/mL

## 2024-03-27 NOTE — Telephone Encounter (Signed)
 Patient is scheduled for 07/18/24 with cardiology.

## 2024-03-30 ENCOUNTER — Ambulatory Visit

## 2024-03-30 ENCOUNTER — Encounter (HOSPITAL_COMMUNITY): Payer: Self-pay | Admitting: Emergency Medicine

## 2024-03-30 ENCOUNTER — Ambulatory Visit
Admission: RE | Admit: 2024-03-30 | Discharge: 2024-03-30 | Disposition: A | Discharge status: Home / Self Care | Source: Ambulatory Visit | Attending: Family Medicine | Admitting: Family Medicine

## 2024-03-30 ENCOUNTER — Other Ambulatory Visit: Payer: Self-pay

## 2024-03-30 DIAGNOSIS — Z853 Personal history of malignant neoplasm of breast: Secondary | ICD-10-CM | POA: Diagnosis not present

## 2024-03-30 DIAGNOSIS — R928 Other abnormal and inconclusive findings on diagnostic imaging of breast: Secondary | ICD-10-CM | POA: Diagnosis not present

## 2024-03-30 DIAGNOSIS — N6310 Unspecified lump in the right breast, unspecified quadrant: Secondary | ICD-10-CM

## 2024-03-30 NOTE — Progress Notes (Signed)
 SDW CALL  Patient was given pre-op instructions over the phone. The opportunity was given for the patient to ask questions. No further questions asked. Patient verbalized understanding of instructions given.    PCP - Neda Balk, MD Cardiologist -   PPM/ICD - denies Device Orders - n/a Rep Notified - n/a  Chest x-ray - 02-29-24 EKG - denies Stress Test - denies ECHO - denies Cardiac Cath - denies  Sleep Study - denies CPAP - n/a  Fasting Blood Sugar - per patient she does not check them daily but did range around 130 Checks Blood Sugar MD monitors A1c, Last A1c 7.1 on 02-28-24  Last dose of GLP1 agonist-  OZEMPIC ,  GLP1 instructions: last dose 03-25-24  Blood Thinner Instructions: denies Aspirin  Instructions:n/a  ERAS Protcol - NPO   COVID TEST- na   Anesthesia review: no  Patient denies shortness of breath, fever, cough and chest pain over the phone call   All instructions explained to the patient, with a verbal understanding of the material. Patient agrees to go over the instructions while at home for a better understanding.

## 2024-04-02 ENCOUNTER — Encounter (HOSPITAL_COMMUNITY): Payer: Self-pay | Admitting: Emergency Medicine

## 2024-04-02 NOTE — Anesthesia Preprocedure Evaluation (Addendum)
 Anesthesia Evaluation  Patient identified by MRN, date of birth, ID band Patient awake    Reviewed: Allergy & Precautions, NPO status , Patient's Chart, lab work & pertinent test results  History of Anesthesia Complications (+) PROLONGED EMERGENCE and history of anesthetic complications  Airway Mallampati: II  TM Distance: >3 FB     Dental  (+) Teeth Intact, Caps, Dental Advisory Given   Pulmonary former smoker Pulmonary nodules   breath sounds clear to auscultation + decreased breath sounds      Cardiovascular hypertension, Pt. on medications Normal cardiovascular exam Rhythm:Regular Rate:Normal     Neuro/Psych Glaucoma  negative psych ROS   GI/Hepatic negative GI ROS, Neg liver ROS,,,  Endo/Other  diabetes, Poorly Controlled, Type 2, Oral Hypoglycemic Agents  GLP-1 RA therapy - last dose 5/25 Hyperlipidemia  Renal/GU negative Renal ROS  negative genitourinary   Musculoskeletal  (+) Arthritis , Osteoarthritis,    Abdominal   Peds  Hematology negative hematology ROS (+)   Anesthesia Other Findings   Reproductive/Obstetrics                             Anesthesia Physical Anesthesia Plan  ASA: 3  Anesthesia Plan: General   Post-op Pain Management: Minimal or no pain anticipated   Induction: Intravenous  PONV Risk Score and Plan: 4 or greater and Treatment may vary due to age or medical condition, Ondansetron, Propofol infusion and TIVA  Airway Management Planned: Oral ETT  Additional Equipment: None  Intra-op Plan:   Post-operative Plan: Extubation in OR  Informed Consent: I have reviewed the patients History and Physical, chart, labs and discussed the procedure including the risks, benefits and alternatives for the proposed anesthesia with the patient or authorized representative who has indicated his/her understanding and acceptance.     Dental advisory given  Plan  Discussed with: CRNA and Anesthesiologist  Anesthesia Plan Comments:         Anesthesia Quick Evaluation

## 2024-04-03 ENCOUNTER — Encounter (HOSPITAL_COMMUNITY)
Admission: RE | Disposition: A | Discharge status: Home / Self Care | Payer: Self-pay | Source: Home / Self Care | Attending: Emergency Medicine

## 2024-04-03 ENCOUNTER — Encounter (HOSPITAL_COMMUNITY): Payer: Self-pay | Admitting: Emergency Medicine

## 2024-04-03 ENCOUNTER — Ambulatory Visit (HOSPITAL_COMMUNITY)
Admission: RE | Admit: 2024-04-03 | Discharge: 2024-04-03 | Disposition: A | Discharge status: Home / Self Care | Attending: Emergency Medicine | Admitting: Emergency Medicine

## 2024-04-03 ENCOUNTER — Ambulatory Visit (HOSPITAL_COMMUNITY): Admitting: Anesthesiology

## 2024-04-03 ENCOUNTER — Ambulatory Visit (HOSPITAL_COMMUNITY)

## 2024-04-03 ENCOUNTER — Other Ambulatory Visit: Payer: Self-pay

## 2024-04-03 DIAGNOSIS — Z7984 Long term (current) use of oral hypoglycemic drugs: Secondary | ICD-10-CM | POA: Insufficient documentation

## 2024-04-03 DIAGNOSIS — R059 Cough, unspecified: Secondary | ICD-10-CM | POA: Insufficient documentation

## 2024-04-03 DIAGNOSIS — I10 Essential (primary) hypertension: Secondary | ICD-10-CM | POA: Diagnosis not present

## 2024-04-03 DIAGNOSIS — E119 Type 2 diabetes mellitus without complications: Secondary | ICD-10-CM

## 2024-04-03 DIAGNOSIS — Z87891 Personal history of nicotine dependence: Secondary | ICD-10-CM | POA: Diagnosis not present

## 2024-04-03 DIAGNOSIS — M1990 Unspecified osteoarthritis, unspecified site: Secondary | ICD-10-CM | POA: Diagnosis not present

## 2024-04-03 DIAGNOSIS — Z7982 Long term (current) use of aspirin: Secondary | ICD-10-CM | POA: Diagnosis not present

## 2024-04-03 DIAGNOSIS — Z853 Personal history of malignant neoplasm of breast: Secondary | ICD-10-CM | POA: Diagnosis not present

## 2024-04-03 DIAGNOSIS — J189 Pneumonia, unspecified organism: Secondary | ICD-10-CM | POA: Diagnosis not present

## 2024-04-03 DIAGNOSIS — R918 Other nonspecific abnormal finding of lung field: Secondary | ICD-10-CM

## 2024-04-03 DIAGNOSIS — R911 Solitary pulmonary nodule: Secondary | ICD-10-CM | POA: Diagnosis not present

## 2024-04-03 DIAGNOSIS — Z79899 Other long term (current) drug therapy: Secondary | ICD-10-CM | POA: Insufficient documentation

## 2024-04-03 DIAGNOSIS — Z7985 Long-term (current) use of injectable non-insulin antidiabetic drugs: Secondary | ICD-10-CM | POA: Insufficient documentation

## 2024-04-03 DIAGNOSIS — H409 Unspecified glaucoma: Secondary | ICD-10-CM | POA: Insufficient documentation

## 2024-04-03 DIAGNOSIS — E785 Hyperlipidemia, unspecified: Secondary | ICD-10-CM | POA: Diagnosis not present

## 2024-04-03 DIAGNOSIS — Z48813 Encounter for surgical aftercare following surgery on the respiratory system: Secondary | ICD-10-CM | POA: Diagnosis not present

## 2024-04-03 HISTORY — PX: FIDUCIAL MARKER PLACEMENT: SHX6858

## 2024-04-03 HISTORY — PX: BRONCHIAL NEEDLE ASPIRATION BIOPSY: SHX5106

## 2024-04-03 HISTORY — PX: VIDEO BRONCHOSCOPY WITH ENDOBRONCHIAL NAVIGATION: SHX6175

## 2024-04-03 HISTORY — DX: Other complications of anesthesia, initial encounter: T88.59XA

## 2024-04-03 HISTORY — PX: BRONCHIAL BIOPSY: SHX5109

## 2024-04-03 HISTORY — PX: BRONCHIAL BRUSHINGS: SHX5108

## 2024-04-03 LAB — CBC
HCT: 41.4 % (ref 36.0–46.0)
Hemoglobin: 13.6 g/dL (ref 12.0–15.0)
MCH: 28.6 pg (ref 26.0–34.0)
MCHC: 32.9 g/dL (ref 30.0–36.0)
MCV: 87.2 fL (ref 80.0–100.0)
Platelets: 267 10*3/uL (ref 150–400)
RBC: 4.75 MIL/uL (ref 3.87–5.11)
RDW: 12.7 % (ref 11.5–15.5)
WBC: 8.2 10*3/uL (ref 4.0–10.5)
nRBC: 0 % (ref 0.0–0.2)

## 2024-04-03 LAB — BASIC METABOLIC PANEL WITH GFR
Anion gap: 5 (ref 5–15)
BUN: 17 mg/dL (ref 8–23)
CO2: 29 mmol/L (ref 22–32)
Calcium: 9.4 mg/dL (ref 8.9–10.3)
Chloride: 105 mmol/L (ref 98–111)
Creatinine, Ser: 0.8 mg/dL (ref 0.44–1.00)
GFR, Estimated: >60 mL/min (ref >60)
Glucose, Bld: 167 mg/dL — ABNORMAL HIGH (ref 70–99)
Potassium: 3.8 mmol/L (ref 3.5–5.1)
Sodium: 139 mmol/L (ref 135–145)

## 2024-04-03 LAB — GLUCOSE, CAPILLARY
Glucose-Capillary: 170 mg/dL — ABNORMAL HIGH (ref 70–99)
Glucose-Capillary: 170 mg/dL — ABNORMAL HIGH (ref 70–99)

## 2024-04-03 SURGERY — VIDEO BRONCHOSCOPY WITH ENDOBRONCHIAL NAVIGATION
Anesthesia: General | Laterality: Bilateral

## 2024-04-03 MED ORDER — ONDANSETRON HCL 4 MG/2ML IJ SOLN
INTRAMUSCULAR | Status: DC | PRN
  Administered 2024-04-03: 4 mg via INTRAVENOUS

## 2024-04-03 MED ORDER — FENTANYL CITRATE (PF) 100 MCG/2ML IJ SOLN
INTRAMUSCULAR | Status: AC
  Filled 2024-04-03: qty 2

## 2024-04-03 MED ORDER — LIDOCAINE 2% (20 MG/ML) 5 ML SYRINGE
INTRAMUSCULAR | Status: DC | PRN
  Administered 2024-04-03: 40 mg via INTRAVENOUS
  Administered 2024-04-03: 20 mg via INTRAVENOUS

## 2024-04-03 MED ORDER — LACTATED RINGERS IV SOLN: INTRAVENOUS | Status: DC

## 2024-04-03 MED ORDER — FENTANYL CITRATE (PF) 100 MCG/2ML IJ SOLN: 25.0000 ug | INTRAMUSCULAR | Status: DC | PRN

## 2024-04-03 MED ORDER — PROPOFOL 10 MG/ML IV BOLUS
INTRAVENOUS | Status: DC | PRN
  Administered 2024-04-03: 120 mg via INTRAVENOUS
  Administered 2024-04-03: 30 mg via INTRAVENOUS
  Administered 2024-04-03: 20 mg via INTRAVENOUS

## 2024-04-03 MED ORDER — ONDANSETRON HCL 4 MG/2ML IJ SOLN
4.0000 mg | Freq: Once | INTRAMUSCULAR | Status: DC | PRN
Start: 2024-04-03 — End: 2024-04-03

## 2024-04-03 MED ORDER — DEXAMETHASONE SODIUM PHOSPHATE 10 MG/ML IJ SOLN
INTRAMUSCULAR | Status: DC | PRN
  Administered 2024-04-03: 5 mg via INTRAVENOUS

## 2024-04-03 MED ORDER — INSULIN ASPART 100 UNIT/ML IJ SOLN: 0.0000 [IU] | INTRAMUSCULAR | Status: DC | PRN

## 2024-04-03 MED ORDER — OXYCODONE HCL 5 MG PO TABS: 5.0000 mg | ORAL_TABLET | Freq: Once | ORAL | Status: DC | PRN

## 2024-04-03 MED ORDER — PHENYLEPHRINE 80 MCG/ML (10ML) SYRINGE FOR IV PUSH (FOR BLOOD PRESSURE SUPPORT)
PREFILLED_SYRINGE | INTRAVENOUS | Status: DC | PRN
  Administered 2024-04-03: 80 ug via INTRAVENOUS

## 2024-04-03 MED ORDER — CHLORHEXIDINE GLUCONATE 0.12 % MT SOLN
15.0000 mL | Freq: Once | OROMUCOSAL | Status: AC
  Administered 2024-04-03: 15 mL via OROMUCOSAL

## 2024-04-03 MED ORDER — ROCURONIUM BROMIDE 10 MG/ML (PF) SYRINGE
PREFILLED_SYRINGE | INTRAVENOUS | Status: DC | PRN
  Administered 2024-04-03: 10 mg via INTRAVENOUS
  Administered 2024-04-03: 40 mg via INTRAVENOUS

## 2024-04-03 MED ORDER — SUGAMMADEX SODIUM 200 MG/2ML IV SOLN
INTRAVENOUS | Status: DC | PRN
  Administered 2024-04-03: 200 mg via INTRAVENOUS

## 2024-04-03 MED ORDER — OXYCODONE HCL 5 MG/5ML PO SOLN: 5.0000 mg | Freq: Once | ORAL | Status: DC | PRN

## 2024-04-03 MED ORDER — PROPOFOL 500 MG/50ML IV EMUL
INTRAVENOUS | Status: DC | PRN
Start: 2024-04-03 — End: 2024-04-03
  Administered 2024-04-03: 160 ug/kg/min via INTRAVENOUS

## 2024-04-03 SURGICAL SUPPLY — 1 items: superlock fiducial IMPLANT

## 2024-04-03 NOTE — Anesthesia Procedure Notes (Signed)
 Procedure Name: Intubation Date/Time: 04/03/2024 7:32 AM  Performed by: Sherma Diver, CRNAPre-anesthesia Checklist: Patient identified, Emergency Drugs available, Suction available and Patient being monitored Patient Re-evaluated:Patient Re-evaluated prior to induction Oxygen  Delivery Method: Circle system utilized Preoxygenation: Pre-oxygenation with 100% oxygen  Induction Type: IV induction Ventilation: Mask ventilation without difficulty Laryngoscope Size: McGrath and 3 Grade View: Grade I Tube type: Oral Tube size: 8.5 mm Number of attempts: 1 Airway Equipment and Method: Stylet and Oral airway Placement Confirmation: ETT inserted through vocal cords under direct vision, positive ETCO2 and breath sounds checked- equal and bilateral Secured at: 20 cm Tube secured with: Tape Dental Injury: Teeth and Oropharynx as per pre-operative assessment

## 2024-04-03 NOTE — Anesthesia Postprocedure Evaluation (Signed)
 Anesthesia Post Note  Patient: Priscilla Butler  Procedure(s) Performed: VIDEO BRONCHOSCOPY WITH ENDOBRONCHIAL NAVIGATION (Bilateral) BRONCHOSCOPY, WITH BRUSH BIOPSY BRONCHOSCOPY, WITH NEEDLE ASPIRATION BIOPSY BRONCHOSCOPY, WITH BIOPSY INSERTION, FIDUCIAL MARKERS     Patient location during evaluation: PACU Anesthesia Type: General Level of consciousness: awake and alert and oriented Pain management: pain level controlled Vital Signs Assessment: post-procedure vital signs reviewed and stable Respiratory status: spontaneous breathing, nonlabored ventilation and respiratory function stable Cardiovascular status: blood pressure returned to baseline and stable Postop Assessment: no apparent nausea or vomiting Anesthetic complications: no   No notable events documented.  Last Vitals:  Vitals:   04/03/24 0845 04/03/24 0900  BP: 129/71 122/71  Pulse: 80 74  Resp: 17 12  Temp:    SpO2: 92% 96%    Last Pain:  Vitals:   04/03/24 0844  TempSrc:   PainSc: 0-No pain                 Raiden Haydu A.

## 2024-04-03 NOTE — Discharge Instructions (Signed)
 Flexible Bronchoscopy, Care After This sheet gives you information about how to care for yourself after your test. Your doctor may also give you more specific instructions. If you have problems or questions, contact your doctor. Follow these instructions at home: Eating and drinking When you are wide awake, your numbness is gone and your cough and gag reflexes have come back, you may: Start eating only soft foods. Slowly drink liquids. Six hours after the test, go back to your normal diet. Driving Do not drive for 24 hours if you were given a medicine to help you relax (sedative). Do not drive or use heavy machinery while taking prescription pain medicine. General instructions Take over-the-counter and prescription medicines only as told by your doctor. Return to your normal activities as told. Ask what activities are safe for you. Do not use any products that have nicotine or tobacco in them. This includes cigarettes and e-cigarettes. If you need help quitting, ask your doctor. Keep all follow-up visits as told by your doctor. This is important. It is very important if you had a tissue sample (biopsy) taken. Get help right away if: You have shortness of breath that gets worse. You get light-headed. You feel like you are going to pass out (faint). You have chest pain. You cough up: More than a little blood. More blood than before. Summary Do not use cigarettes. Do not use e-cigarettes. Seek care in the Emergency Department right away if you have chest pain or shortness of breath. Call or MyChart Message our office for any questions or problems at [phone].  Okay to restart your Ozempic  per your normal weekly schedule Okay to restart aspirin  on 04/04/24   This information is not intended to replace advice given to you by your health care provider. Make sure you discuss any questions you have with your health care provider.

## 2024-04-03 NOTE — Interval H&P Note (Signed)
 History and Physical Interval Note:  04/03/2024 7:17 AM  Priscilla Butler  has presented today for surgery, with the diagnosis of Pulmonary nodules.  The various methods of treatment have been discussed with the patient and family. After consideration of risks, benefits and other options for treatment, the patient has consented to  Procedure(s) with comments: BRONCHOSCOPY, WITH BIOPSY USING ELECTROMAGNETIC NAVIGATION (Bilateral) - with flouro as a surgical intervention.  The patient's history has been reviewed, patient examined, no change in status, stable for surgery.  I have reviewed the patient's chart and labs.  Questions were answered to the patient's satisfaction.     Denson Flake

## 2024-04-03 NOTE — Op Note (Signed)
 Video Bronchoscopy with Robotic Assisted Bronchoscopic Navigation   Date of Operation: 04/03/2024   Pre-op Diagnosis: Bilateral pulmonary nodules  Post-op Diagnosis: Same  Surgeon: Racheal Buddle  Assistants: None  Anesthesia: General endotracheal anesthesia  Operation: Flexible video fiberoptic bronchoscopy with robotic assistance and biopsies.  Estimated Blood Loss: Minimal  Complications: None  Indications and History: Priscilla Butler is a 73 y.o. female with history of breast cancer, former tobacco use.  She was found to have an irregular left upper lobe mixed density pulmonary nodule, other scattered tree-in-bud nodularity on CT scan of the chest.  Recommendation made to achieve a tissue diagnosis via robotic assisted navigational bronchoscopy.  The risks, benefits, complications, treatment options and expected outcomes were discussed with the patient.  The possibilities of pneumothorax, pneumonia, reaction to medication, pulmonary aspiration, perforation of a viscus, bleeding, failure to diagnose a condition and creating a complication requiring transfusion or operation were discussed with the patient who freely signed the consent.    Description of Procedure: The patient was seen in the Preoperative Area, was examined and was deemed appropriate to proceed.  The patient was taken to [facility]oscopy room 3, identified as [name] and the procedure verified as Flexible Video Fiberoptic Bronchoscopy.  A Time Out was held and the above information confirmed.   Prior to the date of the procedure a high-resolution CT scan of the chest was performed. Utilizing ION software program a virtual tracheobronchial tree was generated to allow the creation of distinct navigation pathways to the patient's parenchymal abnormalities. After being taken to the operating room general anesthesia was initiated and the patient  was orally intubated. The video fiberoptic bronchoscope was introduced via the  endotracheal tube and a general inspection was performed which showed normal right and left lung anatomy. Aspiration of the bilateral mainstems was completed to remove any remaining secretions. Robotic catheter inserted into patient's endotracheal tube.   Target #1 left upper lobe pulmonary nodule: The distinct navigation pathways prepared prior to this procedure were then utilized to navigate to patient's lesion identified on CT scan. The robotic catheter was secured into place and the vision probe was withdrawn.  Lesion location was approximated using fluoroscopy.  Local registration and targeting was performed using Siem[employer]ios mobile C-arm three-dimensional imaging.  The nodule was less well-formed, more groundglass in nature than on her previous CT chest imaging.  That said it could still be visualized.  Under fluoroscopic guidance transbronchial brushings, transbronchial needle biopsies, and transbronchial forceps biopsies were performed to be sent for cytology and pathology.   A bronchioalveolar lavage was performed in the left upper lobe adjacent to the nodule and sent for microbiology.  Under fluoroscopic guidance a single fiducial marker was placed adjacent to the nodule.   At the end of the procedure a general airway inspection was performed and there was no evidence of active bleeding. The bronchoscope was removed.  The patient tolerated the procedure well. There was no significant blood loss and there were no obvious complications. A post-procedural chest x-ray is pending.  Samples Target #1: 1. Transbronchial needle brushings from left upper lobe nodule 2. Transbronchial Wang needle biopsies from left upper lobe nodule 3. Transbronchial forceps biopsies from left upper lobe nodule 4. Bronchoalveolar lavage from left upper lobe    Plans:  The patient will be discharged from the PACU to home when recovered from anesthesia and after chest x-ray is reviewed. We will review  the cytology, pathology and microbiology results with the  patient when they become available. Outpatient followup will be with Braden Caddy, NP and Dr Baldwin Levee.   Racheal Buddle, MD, PhD 04/03/2024, 8:36 AM [facility] [phone] or if no answer before 7:00PM call [phone] For any issues after 7:00PM please call eLink [phone]

## 2024-04-03 NOTE — Addendum Note (Signed)
 Addendum  created 04/03/24 1225 by Sherma Diver, CRNA   LDA properties accepted

## 2024-04-03 NOTE — Transfer of Care (Signed)
 Immediate Anesthesia Transfer of Care Note  Patient: Priscilla Butler  Procedure(s) Performed: VIDEO BRONCHOSCOPY WITH ENDOBRONCHIAL NAVIGATION (Bilateral) BRONCHOSCOPY, WITH BRUSH BIOPSY BRONCHOSCOPY, WITH NEEDLE ASPIRATION BIOPSY BRONCHOSCOPY, WITH BIOPSY INSERTION, FIDUCIAL MARKERS  Patient Location: PACU  Anesthesia Type:General  Level of Consciousness: awake, oriented, and patient cooperative  Airway & Oxygen  Therapy: Patient Spontanous Breathing and Patient connected to nasal cannula oxygen   Post-op Assessment: Report given to RN and Post -op Vital signs reviewed and stable  Post vital signs: Reviewed and stable  Last Vitals:  Vitals Value Taken Time  BP    Temp    Pulse    Resp    SpO2      Last Pain:  Vitals:   04/03/24 0625  TempSrc:   PainSc: 0-No pain         Complications: No notable events documented.

## 2024-04-04 LAB — ACID FAST SMEAR (AFB, MYCOBACTERIA): Acid Fast Smear: NEGATIVE

## 2024-04-04 LAB — CYTOLOGY - NON PAP

## 2024-04-05 LAB — CULTURE, BAL-QUANTITATIVE W GRAM STAIN

## 2024-04-06 LAB — FUNGUS CULTURE RESULT

## 2024-04-08 LAB — AEROBIC/ANAEROBIC CULTURE W GRAM STAIN (SURGICAL/DEEP WOUND)
Culture: NO GROWTH
Gram Stain: NONE SEEN

## 2024-04-10 ENCOUNTER — Encounter: Payer: Self-pay | Admitting: Acute Care

## 2024-04-10 ENCOUNTER — Ambulatory Visit: Admitting: Acute Care

## 2024-04-10 VITALS — BP 135/80 | HR 73 | Ht 62.0 in | Wt 154.0 lb

## 2024-04-10 DIAGNOSIS — Z87891 Personal history of nicotine dependence: Secondary | ICD-10-CM | POA: Diagnosis not present

## 2024-04-10 DIAGNOSIS — I700 Atherosclerosis of aorta: Secondary | ICD-10-CM | POA: Diagnosis not present

## 2024-04-10 DIAGNOSIS — Z853 Personal history of malignant neoplasm of breast: Secondary | ICD-10-CM

## 2024-04-10 DIAGNOSIS — R911 Solitary pulmonary nodule: Secondary | ICD-10-CM

## 2024-04-10 DIAGNOSIS — I2510 Atherosclerotic heart disease of native coronary artery without angina pectoris: Secondary | ICD-10-CM

## 2024-04-10 NOTE — Patient Instructions (Addendum)
 It is good to see you today. I am glad you have done well after the bronchoscopy with biopsies. Your biopsies were negative for malignancy, which is great news.  The biopsies resulted as benign reactive bronchial cells with acute inflammatory cells. Your cultures were also negative Plan will be for 16-month follow-up CT of the chest to continue surveillance of the lung nodule. This will be due in August 2025. You will get a call closer to the time to get this scheduled. Once it is scheduled you will get a follow-up appointment scheduled with me to review the results and determine next best steps in plan of care. Please call if you need us  for anything at all. If you experience unexplained weight loss or blood in your sputum call to be seen. Please contact office for sooner follow up if symptoms do not improve or worsen or seek emergency care

## 2024-04-10 NOTE — Progress Notes (Signed)
 History of Present Illness Priscilla Butler is a 73 y.o. female  referred to see Dr. Baldwin Levee for a pulmonary nodule 03/2024. Additional PMH includes stage one breast cancer .  Synopsis Pt. referred by Doctor Nathalie Baize 03/2024  for evaluation of a pulmonary nodule found during imaging for a cough. Her cough is improved and she denies any respiratory symptoms.  No chest pain, no wheezing no tightness.  She feels  the cough is related to allergies.   She was initially evaluated for a persistent cough, which led to a chest x-ray revealing a pulmonary nodule. A subsequent CT scan of the chest was performed on Mar 01, 2024, to further investigate the findings.   The CT scan showed an irregular posterior left upper lobe nodule measuring 1.7 by 1.2 centimeters with some central lucency. Additionally, there is an adjacent subpleural ground glass nodule measuring 1.2 by 1.1 centimeters and multifocal tree-in-bud nodularity, including in the right middle lobe, left upper lobe, and left lower lobe. No mediastinal or hilar adenopathy was noted.   She has a history of remote stage one breast cancer, diabetes, and hypertension. She is a former smoker, approximately 20-25 pack years.  She recalls being hospitalized as a youth for tuberculosis but she is unclear whether this was a latent TB or whether she was actually treated for active TB. Quantiferon Gold was negative.  She underwent bronchoscopy with biopsy 04/03/2024. She is here today to follow up on cytology and to sure she has done well after the procedure.   04/10/2024 Priscilla Butler is a 73 year old female who presents for follow-up after bronchoscopy and biopsies.  She underwent a bronchoscopy and biopsies targeting the left upper lobe of the lung. No complications such as bleeding, fever, or shortness of breath were noted following the procedure, and she tolerated the anesthesia well. The biopsies showed benign bronchial alveolar cells and acute inflammation, with  no evidence of malignancy, fungal elements, or organisms. Cultures showed no growth, and AFB was negative.  She has a history of right-sided breast cancer and received radiation treatment. She also has a history of smoking, having quit over twenty years ago. She recalls having pneumonia approximately nine years ago, but has not had a chest x-ray since then.  Her allergies often manifest as a cough. No current respiratory issues or breathing problems. She has received a pneumococcal vaccine.  We have discussed that we will need to maintain surveillance imaging of this nodule until we can confirm no growth for 2 years if solid and 5 years if semi solid.She is ain agreement with this plan.  Test Results: Cytology 04/03/2024 A.  LEFT LUNG, UPPER LOBE, NODULE, BRUSHING:  - Negative for malignancy  - Numerous benign/reactive bronchial cells with acute inflammatory cells   B.  LEFT LUNG, UPPER LOBE, NODULE, NEEDLE ASPIRATION  BIOPSY FORCEPS:  - Negative for malignancy  - Scant cellularity; benign bronchial cells  - Benign bronchial mucosa and alveolated lung (cellblock)   All Cultures and Micro were negative      Latest Ref Rng & Units 04/03/2024    6:05 AM 02/28/2024    9:32 AM 07/19/2023   11:03 AM  CBC  WBC 4.0 - 10.5 K/uL 8.2  10.5  6.5   Hemoglobin 12.0 - 15.0 g/dL 69.6  29.5  28.4   Hematocrit 36.0 - 46.0 % 41.4  40.6  39.6   Platelets 150 - 400 K/uL 267  405.0  278.0  Latest Ref Rng & Units 04/03/2024    6:05 AM 02/28/2024    9:32 AM 07/19/2023   11:03 AM  BMP  Glucose 70 - 99 mg/dL 098  119  147   BUN 8 - 23 mg/dL 17  11  15    Creatinine 0.44 - 1.00 mg/dL 8.29  5.62  1.30   Sodium 135 - 145 mmol/L 139  139  140   Potassium 3.5 - 5.1 mmol/L 3.8  4.2  4.2   Chloride 98 - 111 mmol/L 105  102  105   CO2 22 - 32 mmol/L 29  29  28    Calcium  8.9 - 10.3 mg/dL 9.4  9.4  9.3     BNP No results found for: "BNP"  ProBNP No results found for: "PROBNP"  PFT No results found  for: "FEV1PRE", "FEV1POST", "FVCPRE", "FVCPOST", "TLC", "DLCOUNC", "PREFEV1FVCRT", "PSTFEV1FVCRT"  DG Chest Port 1 View Result Date: 04/03/2024 CLINICAL DATA:  Status post bronchoscopy EXAM: PORTABLE CHEST 1 VIEW COMPARISON:  February 29, 2024 FINDINGS: Comparison with prior examination ill-defined left perihilar nodular density in the left upper lobe measures about 2 cm in diameter with a small marker clip from prior biopsy. There is no evidence of pneumothorax. No consolidations no pleural effusions heart and mediastinum normal IMPRESSION: *No evidence of pneumothorax. *Left upper lobe nodular density. Electronically Signed   By: Fredrich Jefferson M.D.   On: 04/03/2024 09:37   DG C-ARM BRONCHOSCOPY Result Date: 04/03/2024 C-ARM BRONCHOSCOPY: Fluoroscopy was utilized by the requesting physician.  No radiographic interpretation.   MM 3D DIAGNOSTIC MAMMOGRAM UNILATERAL RIGHT BREAST Result Date: 03/30/2024 CLINICAL DATA:  73 year old woman status post RIGHT breast lumpectomy in 2009, which showed invasive ductal carcinoma and DCIS with negative margins. She presents for diagnostic workup of incidentally noted RIGHT breast abnormality on recent chest CT from 03/01/2024. EXAM: DIGITAL DIAGNOSTIC UNILATERAL RIGHT MAMMOGRAM WITH TOMOSYNTHESIS AND CAD TECHNIQUE: Right digital diagnostic mammography and breast tomosynthesis was performed. The images were evaluated with computer-aided detection. COMPARISON:  Previous exam(s). ACR Breast Density Category b: There are scattered areas of fibroglandular density. FINDINGS: RIGHT: Mammogram: Postlumpectomy changes of the retroareolar RIGHT breast is stable when compared to multiple prior mammograms and corresponds to the abnormality seen on recent chest CT. No suspicious mass, asymmetry, or microcalcifications. IMPRESSION: Stable postsurgical changes of the RIGHT breast. No findings suspicious for RIGHT breast malignancy. RECOMMENDATION: BILATERAL screening mammogram in March  2026. I have discussed the findings and recommendations with the patient. If applicable, a reminder letter will be sent to the patient regarding the next appointment. BI-RADS CATEGORY  2: Benign. Electronically Signed   By: Elester Grim M.D.   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   DeQuervain's disease (tenosynovitis)    DM (diabetes mellitus), type 2 (HCC)    H/O measles    H/O mumps    History of chicken pox    Hx of TB skin testing    Hyperglycemia    Hyperlipidemia    Menopause    Osteopenia    Otitis externa of left ear 01/30/2017   Preventative health care 01/19/2016   Welcome to Medicare preventive visit 01/27/2017     Social History   Tobacco Use   Smoking status:  Former    Current packs/day: 0.00    Types: Cigarettes    Quit date: 11/02/1999    Years since quitting: 24.4   Smokeless tobacco: Never  Vaping Use   Vaping status: Never Used  Substance Use Topics   Alcohol use: Yes    Alcohol/week: 3.0 standard drinks of alcohol    Types: 3 Cans of beer per week    Comment: socially   Drug use: No    Ms.Armenteros reports that she quit smoking about 24 years ago. Her smoking use included cigarettes. She has never used smokeless tobacco. She reports current alcohol use of about 3.0 standard drinks of alcohol per week. She reports that she does not use drugs.  Tobacco Cessation: Former smoker , quit 2001 with a 20-25 pack year smoking history   Past surgical hx, Family hx, Social hx all reviewed.  Current Outpatient Medications on File Prior to Visit  Medication Sig   amLODipine  (NORVASC ) 5 MG tablet Take 1 tablet (5 mg total) by mouth daily.   aspirin  EC 81 MG tablet Take 1 tablet (81 mg total) by mouth daily.   Black Pepper-Turmeric (TURMERIC  COMPLEX/BLACK PEPPER) 3-500 MG CAPS TAKE 1 CAPSULE BY MOUTH 2 (TWO) TIMES DAILY.   Calcium  Carb-Cholecalciferol  (CALCIUM  + VITAMIN D3) 600-10 MG-MCG TABS Take 1 tablet by mouth 2 (two) times daily.   cholecalciferol  (VITAMIN D3) 25 MCG (1000 UNIT) tablet TAKE 1 TABLET BY MOUTH ONCE DAILY   CRANBERRY PO Take 84 mg by mouth daily.   dorzolamide -timolol  (COSOPT ) 2-0.5 % ophthalmic solution Place 1 drop into both eyes 2 (two) times daily.   Fluocinolone  Acetonide 0.01 % OIL Place 1 drop into the ear as directed 2 times daily as needed for itching   glucose blood (ONETOUCH ULTRA) test strip USE TO TEST BLOOD SUGAR TWICE A DAY AS DIRECTED   latanoprost  (XALATAN ) 0.005 % ophthalmic solution Place 1 drop into both eyes at bedtime.   metFORMIN  (GLUCOPHAGE ) 500 MG tablet Take 1 tablet (500 mg total) by mouth 2 (two) times daily with a meal.   Multiple Vitamin (MULTIVITAMIN) tablet Take 1 tablet by mouth daily.   Omega-3 Fatty Acids (FISH OIL) 1000 MG CAPS Take 1 capsule by mouth 2 (two) times daily.   Probiotic Product (PROBIOTIC DAILY) CAPS Take 1 capsule by mouth daily. NOW company   Psyllium (METAMUCIL SMOOTH TEXTURE) 28.3 % POWD 1 tsp daily in water   rosuvastatin  (CRESTOR ) 40 MG tablet Take 1 tablet (40 mg total) by mouth daily.   Semaglutide ,0.25 or 0.5MG /DOS, (OZEMPIC , 0.25 OR 0.5 MG/DOSE,) 2 MG/3ML SOPN Inject 0.5 mg into the skin once a week.   sertraline  (ZOLOFT ) 50 MG tablet Take 1 tablet (50 mg total) by mouth daily.   No current facility-administered medications on file prior to visit.     Allergies  Allergen Reactions   Lisinopril  Cough    Review Of Systems:  Constitutional:   No  weight loss, night sweats,  Fevers, chills, fatigue, or  lassitude.  HEENT:   No headaches,  Difficulty swallowing,  Tooth/dental problems, or  Sore throat,                No sneezing, itching, ear ache, nasal congestion, post nasal drip,   CV:  No chest pain,  Orthopnea, PND, swelling in lower  extremities, anasarca, dizziness, palpitations, syncope.   GI  No heartburn, indigestion, abdominal pain, nausea, vomiting, diarrhea, change in bowel habits, loss of appetite, bloody stools.   Resp:  No shortness of breath with exertion or at rest.  No excess mucus, no productive cough,  No non-productive cough,  No coughing up of blood.  No change in color of mucus.  No wheezing.  No chest wall deformity  Skin: no rash or lesions.  GU: no dysuria, change in color of urine, no urgency or frequency.  No flank pain, no hematuria   MS:  No joint pain or swelling.  No decreased range of motion.  No back pain.  Psych:  No change in mood or affect. No depression or anxiety.  No memory loss.   Vital Signs BP 135/80 (BP Location: Left Arm, Patient Position: Sitting, Cuff Size: Normal)   Pulse 73   Ht 5\' 2"  (1.575 m)   Wt 154 lb (69.9 kg)   SpO2 97%   BMI 28.17 kg/m    Physical Exam:  General- No distress,  A&Ox3, pleasant ENT: No sinus tenderness, TM clear, pale nasal mucosa, no oral exudate,no post nasal drip, no LAN Cardiac: S1, S2, regular rate and rhythm, no murmur Chest: No wheeze/ rales/ dullness; no accessory muscle use, no nasal flaring, no sternal retractions, Abd.: Soft Non-tender, ND, BS +, Body mass index is 28.17 kg/m.  Ext: No clubbing cyanosis, edema Neuro:  normal strength, MAE x 4, A&O x 3 , appropriate Skin: No rashes, warm and dry, no lesions  Psych: normal mood and behavior   Assessment/Plan   Pulmonary nodule  Former smoker  History of Stage 1 breast cancer Negative for malignancy Plan - Order follow-up CT chest in August 2025  to monitor the nodule for changes. - Schedule follow-up appointment in three months to review CT results. - If stable at three months, extend follow-up intervals to six months, then annually if stable. - Please call if you need us  for anything at all. - If you experience unexplained weight loss or blood in your sputum call to be  seen. - Please contact office for sooner follow up if symptoms do not improve or worsen or seek emergency care    Aortic atherosclerosis and coronary artery calcifications Aortic atherosclerosis and coronary artery calcifications noted on imaging.  Plan Currently on a statin.   General Health Maintenance History of Pneumonia  Plan Pneumococcal vaccine is up to date.  I spent 20 minutes dedicated to the care of this patient on the date of this encounter to include pre-visit review of records, face-to-face time with the patient discussing conditions above, post visit ordering of testing, clinical documentation with the electronic health record, making appropriate referrals as documented, and communicating necessary information to the patient's healthcare team.    Raejean Bullock, NP 04/10/2024  11:12 AM

## 2024-04-23 ENCOUNTER — Other Ambulatory Visit: Payer: Self-pay | Admitting: Family Medicine

## 2024-04-23 ENCOUNTER — Other Ambulatory Visit (HOSPITAL_BASED_OUTPATIENT_CLINIC_OR_DEPARTMENT_OTHER): Payer: Self-pay

## 2024-04-23 ENCOUNTER — Other Ambulatory Visit (HOSPITAL_COMMUNITY): Payer: Self-pay

## 2024-04-23 ENCOUNTER — Other Ambulatory Visit: Payer: Self-pay

## 2024-04-23 MED ORDER — BLACK PEPPER-TURMERIC 3-500 MG PO CAPS
ORAL_CAPSULE | ORAL | 1 refills | Status: AC
  Filled 2024-04-23: qty 120, 60d supply, fill #0
  Filled 2024-04-23: qty 180, 90d supply, fill #0

## 2024-04-23 MED ORDER — SERTRALINE HCL 50 MG PO TABS
50.0000 mg | ORAL_TABLET | Freq: Every day | ORAL | 0 refills | Status: DC
  Filled 2024-04-23 (×2): qty 90, 90d supply, fill #0

## 2024-04-23 MED ORDER — AMLODIPINE BESYLATE 5 MG PO TABS
5.0000 mg | ORAL_TABLET | Freq: Every day | ORAL | 1 refills | Status: DC
  Filled 2024-04-23 (×2): qty 90, 90d supply, fill #0
  Filled 2024-07-18: qty 90, 90d supply, fill #1

## 2024-04-23 MED ORDER — ROSUVASTATIN CALCIUM 40 MG PO TABS
40.0000 mg | ORAL_TABLET | Freq: Every day | ORAL | 1 refills | Status: DC
  Filled 2024-04-23 (×2): qty 90, 90d supply, fill #0
  Filled 2024-07-18: qty 90, 90d supply, fill #1

## 2024-04-23 MED ORDER — METFORMIN HCL 500 MG PO TABS
500.0000 mg | ORAL_TABLET | Freq: Two times a day (BID) | ORAL | 1 refills | Status: DC
  Filled 2024-04-23 (×2): qty 180, 90d supply, fill #0
  Filled 2024-07-18: qty 180, 90d supply, fill #1

## 2024-04-24 ENCOUNTER — Other Ambulatory Visit: Payer: Self-pay

## 2024-04-25 ENCOUNTER — Other Ambulatory Visit: Payer: Self-pay

## 2024-04-26 ENCOUNTER — Other Ambulatory Visit: Payer: Self-pay

## 2024-04-30 DIAGNOSIS — L821 Other seborrheic keratosis: Secondary | ICD-10-CM | POA: Diagnosis not present

## 2024-04-30 DIAGNOSIS — D225 Melanocytic nevi of trunk: Secondary | ICD-10-CM | POA: Diagnosis not present

## 2024-04-30 DIAGNOSIS — L814 Other melanin hyperpigmentation: Secondary | ICD-10-CM | POA: Diagnosis not present

## 2024-05-03 LAB — FUNGAL ORGANISM REFLEX

## 2024-05-03 LAB — FUNGUS CULTURE WITH STAIN

## 2024-05-14 LAB — ACID FAST CULTURE WITH REFLEXED SENSITIVITIES (MYCOBACTERIA): Acid Fast Culture: NEGATIVE

## 2024-05-24 ENCOUNTER — Other Ambulatory Visit (HOSPITAL_COMMUNITY): Payer: Self-pay

## 2024-05-31 ENCOUNTER — Other Ambulatory Visit (HOSPITAL_BASED_OUTPATIENT_CLINIC_OR_DEPARTMENT_OTHER): Payer: Self-pay

## 2024-05-31 ENCOUNTER — Other Ambulatory Visit: Payer: Self-pay | Admitting: Pharmacist

## 2024-05-31 ENCOUNTER — Encounter: Payer: Self-pay | Admitting: Pharmacist

## 2024-05-31 MED ORDER — OZEMPIC (0.25 OR 0.5 MG/DOSE) 2 MG/3ML ~~LOC~~ SOPN
0.5000 mg | PEN_INJECTOR | SUBCUTANEOUS | 2 refills | Status: DC
  Filled 2024-05-31 – 2024-06-25 (×4): qty 3, 28d supply, fill #0
  Filled 2024-07-18: qty 3, 28d supply, fill #1

## 2024-05-31 NOTE — Progress Notes (Signed)
 Pharmacy Quality Measure Review  This patient is appearing on a report for being at risk of failing the adherence measure for diabetes medications this calendar year.   Medication: Ozmepic Last fill date: 03/07/2024 for 28 day supply per adherence report  Reviewed recent refill history in Dr Annemarie database. Actual last refill date was 04/28/2024 for 28 day supply. Patient has no refills remaining. Next appointment with PCP is 09/24/2024.    Will collaborate with provider to facilitate refill needs. Sent in Rx with 2 refills.   Madelin Ray, PharmD Clinical Pharmacist [facility]  Population Health [phone]

## 2024-06-04 ENCOUNTER — Other Ambulatory Visit (HOSPITAL_BASED_OUTPATIENT_CLINIC_OR_DEPARTMENT_OTHER): Payer: Self-pay

## 2024-06-04 ENCOUNTER — Other Ambulatory Visit: Payer: Self-pay

## 2024-06-05 ENCOUNTER — Ambulatory Visit: Admitting: Family Medicine

## 2024-06-09 ENCOUNTER — Ambulatory Visit
Admission: EM | Admit: 2024-06-09 | Discharge: 2024-06-09 | Disposition: A | Discharge status: Home / Self Care | Attending: Family Medicine | Admitting: Family Medicine

## 2024-06-09 ENCOUNTER — Other Ambulatory Visit: Payer: Self-pay

## 2024-06-09 ENCOUNTER — Encounter: Payer: Self-pay | Admitting: *Deleted

## 2024-06-09 DIAGNOSIS — N3001 Acute cystitis with hematuria: Secondary | ICD-10-CM | POA: Diagnosis not present

## 2024-06-09 DIAGNOSIS — R300 Dysuria: Secondary | ICD-10-CM | POA: Diagnosis not present

## 2024-06-09 LAB — POCT URINALYSIS DIP (MANUAL ENTRY)
Bilirubin, UA: NEGATIVE
Glucose, UA: 100 mg/dL — AB
Ketones, POC UA: NEGATIVE mg/dL
Nitrite, UA: POSITIVE — AB
Protein Ur, POC: >=300 mg/dL — AB
Spec Grav, UA: >=1.03 — AB (ref 1.010–1.025)
Urobilinogen, UA: 1 U/dL
pH, UA: 5 (ref 5.0–8.0)

## 2024-06-09 MED ORDER — CEFDINIR 300 MG PO CAPS: 300.0000 mg | ORAL_CAPSULE | Freq: Two times a day (BID) | ORAL | 0 refills | Status: AC

## 2024-06-09 NOTE — Discharge Instructions (Addendum)
 Advised patient to take medication as directed with food to completion.  Encouraged to increase daily water intake to 64 ounces per day while taking this medication.  Advised if symptoms worsen and/or unresolved please follow-up with your PCP or here for further evaluation.

## 2024-06-09 NOTE — ED Provider Notes (Signed)
 [facility]    CSN: [id number] Arrival date & time: 06/09/24  1351      History   Chief Complaint Chief Complaint  Patient presents with   Dysuria   Urinary Frequency    HPI Priscilla Butler is a 73 y.o. female.   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   DeQuervain's disease (tenosynovitis)    DM (diabetes mellitus), type 2 (HCC)    H/O measles    H/O mumps    History of chicken pox    Hx of TB skin testing    Hyperglycemia    Hyperlipidemia    Menopause    Osteopenia    Otitis externa of left ear 01/30/2017   Preventative health care 01/19/2016   Welcome to Medicare preventive visit 01/27/2017    Patient Active Problem List   Diagnosis Date Noted   Tinnitus of both ears 03/05/2024   Pulmonary nodules 03/04/2024   Cerumen impaction 07/25/2023   Finger pain, left 12/12/2022   Cyst of subcutaneous tissue 12/14/2020   Cervical cancer screening 12/09/2020   Rash 08/15/2019   Urinary frequency 08/15/2019   Otitis externa of left ear 01/30/2017   Welcome to Medicare preventive visit 01/27/2017   Glaucoma 07/31/2016   Preventative health care 01/19/2016   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   HTN (hypertension) 08/16/2015   Neck pain on right side 08/16/2015   Pain in joint of right shoulder 08/16/2015   Acromioclavicular joint arthritis 08/14/2015   Subacromial bursitis 08/14/2015   Degenerative disc disease, cervical 08/14/2015   History of chicken pox    Hx of TB skin testing    Skin cancer, basal cell 12/04/2012   Hyperlipidemia    DM (diabetes mellitus), type 2 (HCC)    Menopause    Osteopenia    HX: breast cancer 01/31/2008    Past Surgical History:  Procedure Laterality Date   BREAST LUMPECTOMY  03/2008   Right breast   BRONCHIAL  BIOPSY  04/03/2024   Procedure: BRONCHOSCOPY, WITH BIOPSY;  Surgeon: Shelah Lamar RAMAN, MD;  Location: [facility] ENDOSCOPY;  Service: Pulmonary;;   BRONCHIAL BRUSHINGS  04/03/2024   Procedure: BRONCHOSCOPY, WITH BRUSH BIOPSY;  Surgeon: Shelah Lamar RAMAN, MD;  Location: [facility] ENDOSCOPY;  Service: Pulmonary;;   BRONCHIAL NEEDLE ASPIRATION BIOPSY  04/03/2024   Procedure: BRONCHOSCOPY, WITH NEEDLE ASPIRATION BIOPSY;  Surgeon: Shelah Lamar RAMAN, MD;  Location: [facility] ENDOSCOPY;  Service: Pulmonary;;   FIDUCIAL MARKER PLACEMENT  04/03/2024   Procedure: INSERTION, FIDUCIAL MARKERS;  Surgeon: Shelah Lamar RAMAN, MD;  Location: [facility] ENDOSCOPY;  Service: Pulmonary;;   MOHS SURGERY  2013   BCC right upper lip, Oolitic skin surgery   SKIN SURGERY     facial  skin cancer   VIDEO BRONCHOSCOPY WITH ENDOBRONCHIAL NAVIGATION Bilateral 04/03/2024   Procedure: VIDEO BRONCHOSCOPY WITH ENDOBRONCHIAL NAVIGATION;  Surgeon: Shelah Lamar RAMAN, MD;  Location: [facility] ENDOSCOPY;  Service: Pulmonary;  Laterality: Bilateral;  with flouro   WISDOM TOOTH EXTRACTION  1972    OB History     Gravida  1   Para  1   Term      Preterm      AB      Living         SAB  IAB      Ectopic      Multiple      Live Births               Home Medications    Prior to Admission medications   Medication Sig Start Date End Date Taking? Authorizing Provider  cefdinir  (OMNICEF ) 300 MG capsule Take 1 capsule (300 mg total) by mouth 2 (two) times daily for 7 days. 06/09/24 06/16/24 Yes Teddy Sharper, FNP  amLODipine  (NORVASC ) 5 MG tablet Take 1 tablet (5 mg total) by mouth daily. 04/23/24   Domenica Harlene LABOR, MD  aspirin  EC 81 MG tablet Take 1 tablet (81 mg total) by mouth daily. 06/28/23   Domenica Harlene LABOR, MD  Black Pepper-Turmeric (TURMERIC COMPLEX/BLACK PEPPER) 3-500 MG CAPS TAKE 1 CAPSULE BY MOUTH 2 (TWO) TIMES DAILY. 04/23/24   Domenica Harlene LABOR, MD  Calcium  Carb-Cholecalciferol  (CALCIUM  + VITAMIN D3) 600-10 MG-MCG TABS Take 1 tablet by mouth 2 (two)  times daily. 08/05/23   Domenica Harlene LABOR, MD  cholecalciferol  (VITAMIN D3) 25 MCG (1000 UNIT) tablet TAKE 1 TABLET BY MOUTH ONCE DAILY 09/13/22   Domenica Harlene LABOR, MD  CRANBERRY PO Take 84 mg by mouth daily.    [provider]  dorzolamide -timolol  (COSOPT ) 2-0.5 % ophthalmic solution Place 1 drop into both eyes 2 (two) times daily. 12/05/23     Fluocinolone  Acetonide 0.01 % OIL Place 1 drop into the ear as directed 2 times daily as needed for itching 10/07/23   Domenica Harlene LABOR, MD  glucose blood (ONETOUCH ULTRA) test strip USE TO TEST BLOOD SUGAR TWICE A DAY AS DIRECTED 05/04/23   Domenica Harlene LABOR, MD  latanoprost  (XALATAN ) 0.005 % ophthalmic solution Place 1 drop into both eyes at bedtime. 02/29/24     metFORMIN  (GLUCOPHAGE ) 500 MG tablet Take 1 tablet (500 mg total) by mouth 2 (two) times daily with a meal. 04/23/24   Domenica Harlene LABOR, MD  Multiple Vitamin (MULTIVITAMIN) tablet Take 1 tablet by mouth daily.    [provider]  Omega-3 Fatty Acids (FISH OIL) 1000 MG CAPS Take 1 capsule by mouth 2 (two) times daily.    [provider]  Probiotic Product (PROBIOTIC DAILY) CAPS Take 1 capsule by mouth daily. [employer] company 02/06/18   Domenica Harlene LABOR, MD  Psyllium (METAMUCIL SMOOTH TEXTURE) 28.3 % POWD 1 tsp daily in water 02/06/18   Domenica Harlene LABOR, MD  rosuvastatin  (CRESTOR ) 40 MG tablet Take 1 tablet (40 mg total) by mouth daily. 04/23/24 04/23/25  Domenica Harlene LABOR, MD  Semaglutide ,0.25 or 0.5MG /DOS, (OZEMPIC , 0.25 OR 0.5 MG/DOSE,) 2 MG/3ML SOPN Inject 0.5 mg into the skin once a week. 05/31/24   Domenica Harlene LABOR, MD  sertraline  (ZOLOFT ) 50 MG tablet Take 1 tablet (50 mg total) by mouth daily. 04/23/24   Domenica Harlene LABOR, MD    Family History Family History  Problem Relation Age of Onset   Breast cancer Maternal Grandmother    Cancer Maternal Grandmother 52       ish   Cancer Maternal Grandfather        lung   Hypertension Mother    Hyperlipidemia Mother    Arthritis Mother    Other  Paternal Grandmother     Social History Social History   Tobacco Use   Smoking status: Former    Current packs/day: 0.00    Types: Cigarettes    Quit date: 11/02/1999    Years since quitting: 24.6   Smokeless  tobacco: Never  Vaping Use   Vaping status: Never Used  Substance Use Topics   Alcohol use: Yes    Alcohol/week: 3.0 standard drinks of alcohol    Types: 3 Cans of beer per week    Comment: socially   Drug use: No     Allergies   Lisinopril    Review of Systems Review of Systems   Physical Exam Triage Vital Signs ED Triage Vitals  Encounter Vitals Group     BP      Girls Systolic BP Percentile      Girls Diastolic BP Percentile      Boys Systolic BP Percentile      Boys Diastolic BP Percentile      Pulse      Resp      Temp      Temp src      SpO2      Weight      Height      Head Circumference      Peak Flow      Pain Score      Pain Loc      Pain Education      Exclude from Growth Chart    No data found.  Updated Vital Signs BP 116/75 (BP Location: Right Arm)   Pulse 78   Temp 98.2 F (36.8 C) (Oral)   Resp 18   Ht 5' 2 (1.575 m)   Wt 150 lb (68 kg)   SpO2 95%   BMI 27.44 kg/m   Physical Exam Vitals and nursing note reviewed.  Constitutional:      Appearance: Normal appearance. She is normal weight.  HENT:     Head: Normocephalic and atraumatic.     Mouth/Throat:     Mouth: Mucous membranes are moist.     Pharynx: Oropharynx is clear.  Eyes:     Extraocular Movements: Extraocular movements intact.     Conjunctiva/sclera: Conjunctivae normal.     Pupils: Pupils are equal, round, and reactive to light.  Cardiovascular:     Rate and Rhythm: Normal rate and regular rhythm.     Pulses: Normal pulses.     Heart sounds: Normal heart sounds.  Pulmonary:     Effort: Pulmonary effort is normal.     Breath sounds: Normal breath sounds. No wheezing, rhonchi or rales.  Musculoskeletal:        General: Normal range of motion.      Cervical back: Normal range of motion and neck supple.  Skin:    General: Skin is warm and dry.  Neurological:     General: No focal deficit present.     Mental Status: She is alert and oriented to person, place, and time. Mental status is at baseline.  Psychiatric:        Mood and Affect: Mood normal.        Behavior: Behavior normal.      UC Treatments / Results  Labs (all labs ordered are listed, but only abnormal results are displayed) Labs Reviewed  POCT URINALYSIS DIP (MANUAL ENTRY) - Abnormal; Notable for the following components:      Result Value   Color, UA orange (*)    Clarity, UA cloudy (*)    Glucose, UA =100 (*)    Spec Grav, UA >=1.030 (*)    Blood, UA moderate (*)    Protein Ur, POC >=300 (*)    Nitrite, UA Positive (*)    Leukocytes, UA Small (1+) (*)  All other components within normal limits  URINE CULTURE    EKG   Radiology No results found.  Procedures Procedures (including critical care time)  Medications Ordered in UC Medications - No data to display  Initial Impression / Assessment and Plan / UC Course  I have reviewed the triage vital signs and the nursing notes.  Pertinent labs & imaging results that were available during my care of the patient were reviewed by me and considered in my medical decision making (see chart for details).     MDM: 1.  Acute cystitis with hematuria-UA revealed above, urine culture ordered, Rx'd cefdinir  300 mg capsule: Take 1 capsule twice daily x 7 days; 2.  Dysuria-UA revealed above, urine culture ordered, Rx'd cefdinir  300 mg capsule: Take 1 capsule twice daily x 7 days. Advised patient to take medication as directed with food to completion.  Encouraged to increase daily water intake to 64 ounces per day while taking this medication.  Advised if symptoms worsen and/or unresolved please follow-up with your PCP or here for further evaluation.  Patient discharged home, hemodynamically stable. Final Clinical  Impressions(s) / UC Diagnoses   Final diagnoses:  Acute cystitis with hematuria  Dysuria     Discharge Instructions      Advised patient to take medication as directed with food to completion.  Encouraged to increase daily water intake to 64 ounces per day while taking this medication.  Advised if symptoms worsen and/or unresolved please follow-up with your PCP or here for further evaluation.     ED Prescriptions     Medication Sig Dispense Auth. Provider   cefdinir  (OMNICEF ) 300 MG capsule Take 1 capsule (300 mg total) by mouth 2 (two) times daily for 7 days. 14 capsule Seleni Meller, FNP      PDMP not reviewed this encounter.   Teddy Sharper, FNP 06/09/24 1504

## 2024-06-09 NOTE — ED Triage Notes (Signed)
 Patient states 3 days or painful urination and frequency.  Taking Azo with some relief but symptoms return.

## 2024-06-11 ENCOUNTER — Ambulatory Visit (HOSPITAL_COMMUNITY): Payer: Self-pay

## 2024-06-11 LAB — URINE CULTURE: Culture: 80000 — AB

## 2024-06-13 ENCOUNTER — Other Ambulatory Visit

## 2024-06-22 ENCOUNTER — Other Ambulatory Visit: Payer: Self-pay

## 2024-06-22 ENCOUNTER — Other Ambulatory Visit (HOSPITAL_COMMUNITY): Payer: Self-pay

## 2024-06-27 ENCOUNTER — Other Ambulatory Visit (HOSPITAL_BASED_OUTPATIENT_CLINIC_OR_DEPARTMENT_OTHER): Payer: Self-pay

## 2024-06-28 DIAGNOSIS — H52203 Unspecified astigmatism, bilateral: Secondary | ICD-10-CM | POA: Diagnosis not present

## 2024-06-28 DIAGNOSIS — H25013 Cortical age-related cataract, bilateral: Secondary | ICD-10-CM | POA: Diagnosis not present

## 2024-06-28 DIAGNOSIS — H401113 Primary open-angle glaucoma, right eye, severe stage: Secondary | ICD-10-CM | POA: Diagnosis not present

## 2024-06-28 DIAGNOSIS — H2513 Age-related nuclear cataract, bilateral: Secondary | ICD-10-CM | POA: Diagnosis not present

## 2024-06-28 DIAGNOSIS — E119 Type 2 diabetes mellitus without complications: Secondary | ICD-10-CM | POA: Diagnosis not present

## 2024-06-28 LAB — HM DIABETES EYE EXAM

## 2024-07-11 ENCOUNTER — Ambulatory Visit
Admission: RE | Admit: 2024-07-11 | Discharge: 2024-07-11 | Disposition: A | Discharge status: Home / Self Care | Source: Ambulatory Visit | Attending: Acute Care | Admitting: Acute Care

## 2024-07-11 DIAGNOSIS — J984 Other disorders of lung: Secondary | ICD-10-CM | POA: Diagnosis not present

## 2024-07-11 DIAGNOSIS — R911 Solitary pulmonary nodule: Secondary | ICD-10-CM

## 2024-07-17 ENCOUNTER — Encounter (HOSPITAL_BASED_OUTPATIENT_CLINIC_OR_DEPARTMENT_OTHER): Payer: Self-pay

## 2024-07-18 ENCOUNTER — Other Ambulatory Visit (HOSPITAL_COMMUNITY): Payer: Self-pay

## 2024-07-18 ENCOUNTER — Other Ambulatory Visit (HOSPITAL_BASED_OUTPATIENT_CLINIC_OR_DEPARTMENT_OTHER): Payer: Self-pay

## 2024-07-18 ENCOUNTER — Ambulatory Visit (HOSPITAL_BASED_OUTPATIENT_CLINIC_OR_DEPARTMENT_OTHER): Admitting: Cardiology

## 2024-07-18 ENCOUNTER — Other Ambulatory Visit: Payer: Self-pay | Admitting: Family Medicine

## 2024-07-18 ENCOUNTER — Encounter (HOSPITAL_BASED_OUTPATIENT_CLINIC_OR_DEPARTMENT_OTHER): Payer: Self-pay | Admitting: Cardiology

## 2024-07-18 ENCOUNTER — Other Ambulatory Visit: Payer: Self-pay

## 2024-07-18 VITALS — BP 132/72 | HR 84 | Resp 17 | Ht 62.0 in | Wt 161.0 lb

## 2024-07-18 DIAGNOSIS — I2510 Atherosclerotic heart disease of native coronary artery without angina pectoris: Secondary | ICD-10-CM | POA: Diagnosis not present

## 2024-07-18 DIAGNOSIS — Z7189 Other specified counseling: Secondary | ICD-10-CM | POA: Diagnosis not present

## 2024-07-18 DIAGNOSIS — E7800 Pure hypercholesterolemia, unspecified: Secondary | ICD-10-CM | POA: Diagnosis not present

## 2024-07-18 DIAGNOSIS — I700 Atherosclerosis of aorta: Secondary | ICD-10-CM | POA: Diagnosis not present

## 2024-07-18 DIAGNOSIS — Z712 Person consulting for explanation of examination or test findings: Secondary | ICD-10-CM | POA: Diagnosis not present

## 2024-07-18 MED ORDER — SERTRALINE HCL 50 MG PO TABS
50.0000 mg | ORAL_TABLET | Freq: Every day | ORAL | 1 refills | Status: AC
  Filled 2024-07-18: qty 90, 90d supply, fill #0
  Filled 2024-10-16: qty 90, 90d supply, fill #1

## 2024-07-18 MED ORDER — FLUZONE HIGH-DOSE 0.5 ML IM SUSY
0.5000 mL | PREFILLED_SYRINGE | Freq: Once | INTRAMUSCULAR | 0 refills | Status: AC
Start: 2024-07-18 — End: 2024-07-19
  Filled 2024-07-18: qty 0.5, 1d supply, fill #0

## 2024-07-18 NOTE — Progress Notes (Signed)
 Cardiology Office Note:  .   Date:  07/18/2024  ID:  Priscilla Butler, DOB 09/13/1951, MRN [id number] PCP: Domenica Harlene LABOR, MD  [facility] Providers Cardiologist:  Shelda Bruckner, MD {  History of Present Illness: .   Priscilla Butler is a 73 y.o. female with PMH remote stage 1 breast cancer, pulmonary nodules, coronary calcification and aortic atherosclerosis on CT.  She is seen as a new patient consult for coronary artery disease at the request of Dr. Domenica  Today: Patient was referred on 03/21/24 by Dr. Domenica for coronary artery disease. On review of available notes and data, appears she had a CT scan of her chest on 03/01/24. Comments on this from Dr. Domenica noted coronary artery calcifications, for which she was referred to cardiology for further evaluation. She was referred to pulmonology for the nodules, and the CT also noted breast abnormality, but follow up mammogram was not suspicious for malignancy. She had bronchoscopy, which showed inflammation without infection or malignancy. She is being followed with serial imaging.  She has no personal cardiac history that she is aware of. Was told she had a heart murmur around the time she was pregnant with her son. FH: grandfather had a heart attack, died around age 36. Former tobacco use >20 years ago. Has type II diabetes, was on lisinopril  for diabetes/kidney protection but has a cough. Denies hypertension, states that amlodipine  is for her diabetes. No history of CKD. Has been on statin and aspirin  for a long time.  ROS: Denies chest pain, shortness of breath at rest or with normal exertion. No PND, orthopnea, LE edema or unexpected weight gain. No syncope or palpitations. ROS otherwise negative except as noted.   Studies Reviewed: SABRA    EKG:  EKG Interpretation Date/Time:  Wednesday July 18 2024 13:57:51 EDT Ventricular Rate:  80 PR Interval:  168 QRS Duration:  84 QT Interval:  380 QTC Calculation: 438 R Axis:   -45  Text  Interpretation: Normal sinus rhythm Low voltage QRS Left anterior fascicular block Cannot rule out Inferior infarct (masked by fascicular block?) , age undetermined Cannot rule out Anterior infarct (cited on or before 18-Jul-2024) Confirmed by Bruckner Shelda [phone]) on 07/18/2024 2:06:54 PM    Physical Exam:   VS:  BP 132/72 (BP Location: Left Arm, Patient Position: Sitting, Cuff Size: Normal)   Pulse 84   Resp 17   Ht 5' 2 (1.575 m)   Wt 161 lb (73 kg)   SpO2 97%   BMI 29.45 kg/m    Wt Readings from Last 3 Encounters:  07/18/24 161 lb (73 kg)  06/09/24 150 lb (68 kg)  04/10/24 154 lb (69.9 kg)    GEN: Well nourished, well developed in no acute distress HEENT: Normal, moist mucous membranes NECK: No JVD CARDIAC: regular rhythm, normal S1 and S2, no rubs or gallops. No murmur. VASCULAR: Radial and DP pulses 2+ bilaterally. No carotid bruits RESPIRATORY:  Clear to auscultation without rales, wheezing or rhonchi  ABDOMEN: Soft, non-tender, non-distended MUSCULOSKELETAL:  Ambulates independently SKIN: Warm and dry, no edema NEUROLOGIC:  Alert and oriented x 3. No focal neuro deficits noted. PSYCHIATRIC:  Normal affect    ASSESSMENT AND PLAN: .    Coronary artery calcification Aortic atherosclerosis Hypercholesterolemia -reviewed the actual images/results of her CT scan -reviewed biology of calcification, recommendations for management -she is already on aspirin  81 mg daily and rosuvastatin  40 mg daily -last LDL 52, at goal of <70 -reviewed red flag  warning signs that need immediate medical attention  Type II diabetes -last A1c 7.1 -on Ozempic , reviewed CV benefit with semaglutide   CV risk counseling and prevention -recommend heart healthy/Mediterranean diet, with whole grains, fruits, vegetable, fish, lean meats, nuts, and olive oil. Limit salt. -recommend moderate walking, 3-5 times/week for 30-50 minutes each session. Aim for at least 150 minutes/week. Goal should  be pace of 3 miles/hours, or walking 1.5 miles in 30 minutes -recommend avoidance of tobacco products. Avoid excess alcohol.  Dispo: I would be happy to see her back as needed  Signed, Shelda Bruckner, MD   Shelda Bruckner, MD, PhD, [facility]  [facility]  [place]  Heart & Vascular at [facility] at [facility] [address], [address] [place], [place] [address] [phone]

## 2024-07-18 NOTE — Patient Instructions (Signed)
 Medication Instructions:   Your physician recommends that you continue on your current medications as directed. Please refer to the Current Medication list given to you today.  *If you need a refill on your cardiac medications before your next appointment, please call your pharmacy*    Follow-Up:  As needed with Dr. Lonni

## 2024-07-20 ENCOUNTER — Ambulatory Visit (INDEPENDENT_AMBULATORY_CARE_PROVIDER_SITE_OTHER): Admitting: *Deleted

## 2024-07-20 VITALS — Ht 62.0 in | Wt 161.0 lb

## 2024-07-20 DIAGNOSIS — Z0000 Encounter for general adult medical examination without abnormal findings: Secondary | ICD-10-CM

## 2024-07-20 DIAGNOSIS — Z780 Asymptomatic menopausal state: Secondary | ICD-10-CM

## 2024-07-20 DIAGNOSIS — M8580 Other specified disorders of bone density and structure, unspecified site: Secondary | ICD-10-CM | POA: Diagnosis not present

## 2024-07-20 NOTE — Progress Notes (Signed)
 Please attest this visit in the absence of patient primary care provider.    Subjective:   Priscilla Butler is a 73 y.o. who presents for a Medicare Wellness preventive visit.  As a reminder, Annual Wellness Visits don't include a physical exam, and some assessments may be limited, especially if this visit is performed virtually. We may recommend an in-person follow-up visit with your provider if needed.  Visit Complete: Virtual I connected with  Priscilla Butler on 07/20/24 by a audio enabled telemedicine application and verified that I am speaking with the correct person using two identifiers.  Patient Location: Home  Provider Location: Office/Clinic  I discussed the limitations of evaluation and management by telemedicine. The patient expressed understanding and agreed to proceed.  Vital Signs: Because this visit was a virtual/telehealth visit, some criteria may be missing or patient reported. Any vitals not documented were not able to be obtained and vitals that have been documented are patient reported.   Persons Participating in Visit: Patient.  AWV Questionnaire: Yes: Patient Medicare AWV questionnaire was completed by the patient on 07/20/24; I have confirmed that all information answered by patient is correct and no changes since this date.  Cardiac Risk Factors include: advanced age (>49men, >24 women);diabetes mellitus;hypertension;dyslipidemia;Other (see comment), Risk factor comments: Hx breast cancer     Objective:    Today's Vitals   07/20/24 0947  Weight: 161 lb (73 kg)  Height: 5' 2 (1.575 m)   Body mass index is 29.45 kg/m.     07/20/2024    9:53 AM 06/09/2024    2:34 PM 04/03/2024    6:24 AM 01/03/2023    2:42 PM 08/25/2022    3:03 PM 01/27/2017    2:16 PM  Advanced Directives  Does Patient Have a Medical Advance Directive? Yes Yes No Yes Yes Yes   Type of Estate agent of [place];Living will   Living will;Healthcare Power of [employer] Power of [place];Living will Out of facility DNR (pink MOST or yellow form);Healthcare Power of [place];Living will  Does patient want to make changes to medical advance directive? No - Patient declined   No - Patient declined No - Patient declined Yes (MAU/Ambulatory/Procedural Areas - Information given)   Copy of Healthcare Power of Attorney in Chart? Yes - validated most recent copy scanned in chart (See row information)    Yes - validated most recent copy scanned in chart (See row information) No - copy requested   Would patient like information on creating a medical advance directive?   No - Patient declined        Data saved with a previous flowsheet row definition    Current Medications (verified) Outpatient Encounter Medications as of 07/20/2024  Medication Sig   amLODipine  (NORVASC ) 5 MG tablet Take 1 tablet (5 mg total) by mouth daily.   aspirin  EC 81 MG tablet Take 1 tablet (81 mg total) by mouth daily.   Black Pepper-Turmeric (TURMERIC COMPLEX/BLACK PEPPER) 3-500 MG CAPS TAKE 1 CAPSULE BY MOUTH 2 (TWO) TIMES DAILY.   Calcium  Carb-Cholecalciferol  (CALCIUM  + VITAMIN D3) 600-10 MG-MCG TABS Take 1 tablet by mouth 2 (two) times daily.   cholecalciferol  (VITAMIN D3) 25 MCG (1000 UNIT) tablet TAKE 1 TABLET BY MOUTH ONCE DAILY   CRANBERRY PO Take 84 mg by mouth daily.   dorzolamide -timolol  (COSOPT ) 2-0.5 % ophthalmic solution Place 1 drop into both eyes 2 (two) times daily.   Fluocinolone  Acetonide 0.01 % OIL Place 1 drop into  the ear as directed 2 times daily as needed for itching   glucose blood (ONETOUCH ULTRA) test strip USE TO TEST BLOOD SUGAR TWICE A DAY AS DIRECTED   latanoprost  (XALATAN ) 0.005 % ophthalmic solution Place 1 drop into both eyes at bedtime.   metFORMIN  (GLUCOPHAGE ) 500 MG tablet Take 1 tablet (500 mg total) by mouth 2 (two) times daily with a meal.   Multiple Vitamin (MULTIVITAMIN) tablet Take 1 tablet by mouth daily.   Omega-3 Fatty Acids (FISH OIL) 1000  MG CAPS Take 1 capsule by mouth 2 (two) times daily.   Probiotic Product (PROBIOTIC DAILY) CAPS Take 1 capsule by mouth daily. NOW company   Psyllium (METAMUCIL SMOOTH TEXTURE) 28.3 % POWD 1 tsp daily in water   rosuvastatin  (CRESTOR ) 40 MG tablet Take 1 tablet (40 mg total) by mouth daily.   Semaglutide ,0.25 or 0.5MG /DOS, (OZEMPIC , 0.25 OR 0.5 MG/DOSE,) 2 MG/3ML SOPN Inject 0.5 mg into the skin once a week.   sertraline  (ZOLOFT ) 50 MG tablet Take 1 tablet (50 mg total) by mouth daily.   No facility-administered encounter medications on file as of 07/20/2024.    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   DeQuervain's disease (tenosynovitis)    DM (diabetes mellitus), type 2 (HCC)    H/O measles    H/O mumps    History of chicken pox    Hx of TB skin testing    Hyperglycemia    Hyperlipidemia    Menopause    Osteopenia    Otitis externa of left ear 01/30/2017   Preventative health care 01/19/2016   Welcome to Medicare preventive visit 01/27/2017   Past Surgical History:  Procedure Laterality Date   BREAST LUMPECTOMY  03/2008   Right breast   BRONCHIAL BIOPSY  04/03/2024   Procedure: BRONCHOSCOPY, WITH BIOPSY;  Surgeon: Shelah Lamar RAMAN, MD;  Location: [facility] ENDOSCOPY;  Service: Pulmonary;;   BRONCHIAL BRUSHINGS  04/03/2024   Procedure: BRONCHOSCOPY, WITH BRUSH BIOPSY;  Surgeon: Shelah Lamar RAMAN, MD;  Location: [facility] ENDOSCOPY;  Service: Pulmonary;;   BRONCHIAL NEEDLE ASPIRATION BIOPSY  04/03/2024   Procedure: BRONCHOSCOPY, WITH NEEDLE ASPIRATION BIOPSY;  Surgeon: Shelah Lamar RAMAN, MD;  Location: [facility] ENDOSCOPY;  Service: Pulmonary;;   FIDUCIAL MARKER PLACEMENT  04/03/2024   Procedure: INSERTION, FIDUCIAL MARKERS;  Surgeon: Shelah Lamar RAMAN, MD;  Location: [facility] ENDOSCOPY;  Service:  Pulmonary;;   MOHS SURGERY  2013   BCC right upper lip, [place] skin surgery   SKIN SURGERY     facial  skin cancer   VIDEO BRONCHOSCOPY WITH ENDOBRONCHIAL NAVIGATION Bilateral 04/03/2024   Procedure: VIDEO BRONCHOSCOPY WITH ENDOBRONCHIAL NAVIGATION;  Surgeon: Shelah Lamar RAMAN, MD;  Location: [facility] ENDOSCOPY;  Service: Pulmonary;  Laterality: Bilateral;  with flouro   WISDOM TOOTH EXTRACTION  1972   Family History  Problem Relation Age of Onset   Breast cancer Maternal Grandmother    Cancer Maternal Grandmother 18       ish   Cancer Maternal Grandfather        lung   Hypertension Mother    Hyperlipidemia Mother    Arthritis Mother    Other Paternal Grandmother    Social History   Socioeconomic History   Marital status: Married    Spouse name: Candance  Number of children: 1   Years of education: BA   Highest education level: Bachelor's degree (e.g., BA, AB, BS)  Occupational History   Occupation: Retired    Associate Professor: UNEMPLOYED  Tobacco Use   Smoking status: Former    Current packs/day: 0.00    Types: Cigarettes    Quit date: 11/02/1999    Years since quitting: 24.7   Smokeless tobacco: Never  Vaping Use   Vaping status: Never Used  Substance and Sexual Activity   Alcohol use: Yes    Alcohol/week: 3.0 standard drinks of alcohol    Types: 3 Cans of beer per week    Comment: socially   Drug use: No   Sexual activity: Yes    Partners: Male    Birth control/protection: Post-menopausal    Comment: lives with husband, retired from Audiological scientist, Engineer, agricultural, No dietary restrictions  Other Topics Concern   Not on file  Social History Narrative   Not on file   Social Drivers of Health   Financial Resource Strain: Low Risk  (07/20/2024)   Overall Financial Resource Strain (CARDIA)    Difficulty of Paying Living Expenses: Not hard at all  Food Insecurity: No Food Insecurity (07/20/2024)   Hunger Vital Sign    Worried About Running Out of Food in the Last Year: Never true     Ran Out of Food in the Last Year: Never true  Transportation Needs: No Transportation Needs (07/20/2024)   PRAPARE - Administrator, Civil Service (Medical): No    Lack of Transportation (Non-Medical): No  Physical Activity: Insufficiently Active (07/20/2024)   Exercise Vital Sign    Days of Exercise per Week: 2 days    Minutes of Exercise per Session: 20 min  Stress: No Stress Concern Present (07/20/2024)   [employer] of Occupational Health - Occupational Stress Questionnaire    Feeling of Stress: Only a little  Social Connections: Moderately Isolated (07/20/2024)   Social Connection and Isolation Panel    Frequency of Communication with Friends and Family: Once a week    Frequency of Social Gatherings with Friends and Family: Once a week    Attends Religious Services: 1 to 4 times per year    Active Member of [employer] or Organizations: No    Attends Engineer, structural: Not on file    Marital Status: Married    Tobacco Counseling Counseling given: Not Answered    Clinical Intake:  Pre-visit preparation completed: Yes  Pain : No/denies pain     BMI - recorded: 29.45 Nutritional Status: BMI 25 -29 Overweight Nutritional Risks: None Diabetes: Yes CBG done?: No Did pt. bring in CBG monitor from home?: No  Lab Results  Component Value Date   HGBA1C 7.1 (H) 02/28/2024   HGBA1C 6.7 (H) 07/19/2023   HGBA1C 7.0 (H) 03/14/2023     How often do you need to have someone help you when you read instructions, pamphlets, or other written materials from your doctor or pharmacy?: 1 - Never What is the last grade level you completed in school?: bachelor's degree  Interpreter Needed?: No  Information entered by :: Lolita Libra, CMA(AAMA)   Activities of Daily Living     07/20/2024    8:50 AM  In your present state of health, do you have any difficulty performing the following activities:  Hearing? 0  Vision? 0  Difficulty concentrating or making  decisions? 0  Walking or climbing stairs? 0  Dressing or bathing? 0  Doing errands, shopping? 0  Preparing Food and eating ? N  Using the Toilet? N  In the past six months, have you accidently leaked urine? N  Do you have problems with loss of bowel control? N  Managing your Medications? N  Managing your Finances? N  Housekeeping or managing your Housekeeping? N    Patient Care Team: Domenica Harlene LABOR, MD as PCP - General (Family Medicine) Lonni Slain, MD as PCP - Cardiology (Cardiology) Donnald Charleston, MD as Consulting Physician (Gastroenterology) Patrcia Sharper, MD as Consulting Physician (Ophthalmology) [facility] as Consulting Physician (Dermatology)  I have updated your Care Teams any recent Medical Services you may have received from other providers in the past year.     Assessment:   This is a routine wellness examination for Zakya.  Hearing/Vision screen Hearing Screening - Comments:: Denies hearing difficulties.  Vision Screening - Comments:: Up to date with routine eye exams with Dr Patrcia    Goals Addressed   None    Depression Screen     07/20/2024    9:52 AM 07/25/2023   10:54 AM 03/17/2023    2:52 PM 12/06/2022   11:16 AM 12/06/2022   11:15 AM 08/25/2022    3:03 PM 08/23/2022   11:28 AM  PHQ 2/9 Scores  PHQ - 2 Score 0 0 1 1 1  0 0  PHQ- 9 Score 0  2 1   0    Fall Risk     07/20/2024    8:50 AM 04/10/2024   11:06 AM 03/21/2024    4:06 PM 07/25/2023   10:54 AM 03/17/2023    2:52 PM  Fall Risk   Falls in the past year? 0 0 0 0 0  Number falls in past yr: 0 0  0 0  Injury with Fall? 0 0  0 0  Risk for fall due to : No Fall Risks    No Fall Risks  Follow up Education provided   Falls evaluation completed Falls evaluation completed    MEDICARE RISK AT HOME:  Medicare Risk at Home Any stairs in or around the home?: (Patient-Rptd) Yes If so, are there any without handrails?: (Patient-Rptd) No Home free of loose throw rugs in  walkways, pet beds, electrical cords, etc?: (Patient-Rptd) Yes Adequate lighting in your home to reduce risk of falls?: (Patient-Rptd) Yes Life alert?: (Patient-Rptd) No Use of a cane, walker or w/c?: (Patient-Rptd) No Grab bars in the bathroom?: (Patient-Rptd) Yes Shower chair or bench in shower?: (Patient-Rptd) Yes Elevated toilet seat or a handicapped toilet?: (Patient-Rptd) No  TIMED UP AND GO:  Was the test performed?  No,video  Cognitive Function: 6CIT completed        07/20/2024    9:54 AM 08/25/2022    3:13 PM  6CIT Screen  What Year? 0 points 0 points  What month? 0 points 0 points  What time? 0 points 0 points  Count back from 20 0 points 0 points  Months in reverse 0 points 0 points  Repeat phrase 0 points 0 points  Total Score 0 points 0 points    Immunizations Immunization History  Administered Date(s) Administered   Fluad  Quad(high Dose 65+) 08/04/2021, 09/14/2022   Fluad  Trivalent(High Dose 65+) 07/25/2023   Hepatitis A 12/29/2012   Hepatitis A, Adult 07/25/2013   INFLUENZA, HIGH DOSE SEASONAL PF 07/22/2016, 07/28/2017, 07/24/2018, 07/18/2024   Influenza Split 08/02/2012   Influenza,inj,Quad PF,6+ Mos 07/25/2013   Influenza-Unspecified 08/12/2014, 08/16/2015, 07/28/2020   Moderna SARS-COV2  Booster Vaccination 08/29/2020   Moderna Sars-Covid-2 Vaccination 08/29/2020   PFIZER Comirnaty (Gray Top)Covid-19 Tri-Sucrose Vaccine 04/27/2021   PFIZER(Purple Top)SARS-COV-2 Vaccination 11/21/2019, 12/12/2019   Pfizer Covid-19 Vaccine Bivalent Booster 3yrs & up 04/27/2021, 01/22/2022   Pfizer(Comirnaty )Fall Seasonal Vaccine 12 years and older 09/14/2022, 08/24/2023   Pneumococcal Conjugate-13 07/22/2016   Pneumococcal Polysaccharide-23 12/04/2012, 02/06/2018   Respiratory Syncytial Virus Vaccine ,Recomb Aduvanted(Arexvy ) 10/11/2022   Tdap 10/04/2011, 03/23/2021   Zoster Recombinant(Shingrix) 09/27/2018, 01/05/2019    Screening Tests Health Maintenance  Topic  Date Due   FOOT EXAM  Never done   Medicare Annual Wellness (AWV)  08/26/2023   COVID-19 Vaccine (7 - Pfizer risk 2024-25 season) 07/02/2024   HEMOGLOBIN A1C  08/29/2024   Diabetic kidney evaluation - Urine ACR  03/05/2025   Diabetic kidney evaluation - eGFR measurement  04/03/2025   OPHTHALMOLOGY EXAM  06/28/2025   Colonoscopy  04/08/2026   DTaP/Tdap/Td (3 - Td or Tdap) 03/24/2031   Pneumococcal Vaccine: 50+ Years  Completed   Influenza Vaccine  Completed   DEXA SCAN  Completed   Hepatitis C Screening  Completed   Zoster Vaccines- Shingrix  Completed   HPV VACCINES  Aged Out   Meningococcal B Vaccine  Aged Out   Mammogram  Discontinued    Health Maintenance Items Addressed: DEXA ordered. Will get foot exam at next OV. All other HM up to date.  Additional Screening:  Vision Screening: Recommended annual ophthalmology exams for early detection of glaucoma and other disorders of the eye. Is the patient up to date with their annual eye exam?  Yes  Who is the provider or what is the name of the office in which the patient attends annual eye exams? Dr Patrcia  Dental Screening: Recommended annual dental exams for proper oral hygiene  Community Resource Referral / Chronic Care Management: CRR required this visit?  No   CCM required this visit?  No   Plan:    I have personally reviewed and noted the following in the patient's chart:   Medical and social history Use of alcohol, tobacco or illicit drugs  Current medications and supplements including opioid prescriptions. Patient is not currently taking opioid prescriptions. Functional ability and status Nutritional status Physical activity Advanced directives List of other physicians Hospitalizations, surgeries, and ER visits in previous 12 months Vitals Screenings to include cognitive, depression, and falls Referrals and appointments  In addition, I have reviewed and discussed with patient certain preventive protocols,  quality metrics, and best practice recommendations. A written personalized care plan for preventive services as well as general preventive health recommendations were provided to patient.   Lolita Libra, CMA   07/20/2024   After Visit Summary: (MyChart) Due to this being a telephonic visit, the after visit summary with patients personalized plan was offered to patient via MyChart   Notes: Nothing significant to report at this time.

## 2024-07-20 NOTE — Patient Instructions (Signed)
 Ms. Priscilla Butler , Thank you for taking time out of your busy schedule to complete your Annual Wellness Visit with me. I enjoyed our conversation and look forward to speaking with you again next year. I, as well as your care team,  appreciate your ongoing commitment to your health goals. Please review the following plan we discussed and let me know if I can assist you in the future. Your Game plan/ To Do List    Referrals: If you haven't heard from the office you've been referred to, please reach out to them at the phone provided.   [facility] ([place]):  [phone]  Follow up Visits: Next Medicare AWV with our clinical staff: 07/26/25 9:40am, video    Next Office Visit with your provider: 09/24/24 3pm,  Dr Domenica. (Do diabetic foot exam at this visit)  Clinician Recommendations:  Aim for 30 minutes of exercise or brisk walking, 6-8 glasses of water, and 5 servings of fruits and vegetables each day.       This is a list of the screening recommended for you and due dates:  The IFOB will not be needed since you are doing colonoscopies every 10 years unless you develop symptoms of rectal bleeding, dark stools or other GI symptoms. Health Maintenance  Topic Date Due   Complete foot exam   Never done   Medicare Annual Wellness Visit  08/26/2023   COVID-19 Vaccine (7 - Pfizer risk 2024-25 season) 07/02/2024   Hemoglobin A1C  08/29/2024   Yearly kidney health urinalysis for diabetes  03/05/2025   Yearly kidney function blood test for diabetes  04/03/2025   Eye exam for diabetics  06/28/2025   Colon Cancer Screening  04/08/2026   DTaP/Tdap/Td vaccine (3 - Td or Tdap) 03/24/2031   Pneumococcal Vaccine for age over 63  Completed   Flu Shot  Completed   DEXA scan (bone density measurement)  Completed   Hepatitis C Screening  Completed   Zoster (Shingles) Vaccine  Completed   HPV Vaccine  Aged Out   Meningitis B Vaccine  Aged Out   Breast Cancer Screening  Discontinued    Advanced directives: (In  Chart) A copy of your advanced directives are scanned into your chart should your provider ever need it. Advance Care Planning is important because it:  [x]  Makes sure you receive the medical care that is consistent with your values, goals, and preferences  [x]  It provides guidance to your family and loved ones and reduces their decisional burden about whether or not they are making the right decisions based on your wishes.  Follow the link provided in your after visit summary or read over the paperwork we have mailed to you to help you started getting your Advance Directives in place. If you need assistance in completing these, please reach out to us  so that we can help you!  See attachments for Preventive Care and Fall Prevention Tips.

## 2024-07-20 NOTE — Progress Notes (Signed)
 I have reviewed and agree with [facility] documentation.  Aloysius Mech, MD

## 2024-07-24 ENCOUNTER — Encounter: Payer: Self-pay | Admitting: Acute Care

## 2024-07-24 ENCOUNTER — Ambulatory Visit: Admitting: Acute Care

## 2024-07-24 VITALS — BP 116/78 | HR 73 | Temp 98.0°F | Ht 62.0 in | Wt 158.6 lb

## 2024-07-24 DIAGNOSIS — Z87891 Personal history of nicotine dependence: Secondary | ICD-10-CM | POA: Diagnosis not present

## 2024-07-24 DIAGNOSIS — R911 Solitary pulmonary nodule: Secondary | ICD-10-CM

## 2024-07-24 DIAGNOSIS — Z853 Personal history of malignant neoplasm of breast: Secondary | ICD-10-CM | POA: Diagnosis not present

## 2024-07-24 DIAGNOSIS — R9389 Abnormal findings on diagnostic imaging of other specified body structures: Secondary | ICD-10-CM | POA: Diagnosis not present

## 2024-07-24 NOTE — Progress Notes (Signed)
 History of Present Illness Priscilla Butler is a 73 y.o. female former smoker referred to see Dr. Shelah for a pulmonary nodule 03/2024. Additional PMH includes stage one breast cancer .   Synopsis Synopsis Pt. referred by Doctor Carleton 03/2024  for evaluation of a pulmonary nodule found during imaging for a cough. Her cough is improved and she denies any respiratory symptoms.  No chest pain, no wheezing no tightness.  She feels  the cough is related to allergies.   She was initially evaluated for a persistent cough, which led to a chest x-ray revealing a pulmonary nodule. A subsequent CT scan of the chest was performed on Mar 01, 2024, to further investigate the findings.   The CT scan showed an irregular posterior left upper lobe nodule measuring 1.7 by 1.2 centimeters with some central lucency. Additionally, there is an adjacent subpleural ground glass nodule measuring 1.2 by 1.1 centimeters and multifocal tree-in-bud nodularity, including in the right middle lobe, left upper lobe, and left lower lobe. No mediastinal or hilar adenopathy was noted.   She has a history of remote stage one breast cancer, diabetes, and hypertension. She is a former smoker, approximately 20-25 pack years.  She recalls being hospitalized as a youth for tuberculosis but she is unclear whether this was a latent TB or whether she was actually treated for active TB. Quantiferon Gold was negative.   She underwent bronchoscopy with biopsy 04/03/2024.Cytology was negative for malignancy. She is here today to review her 3 month follow up Ct chest as short term surveillance.   07/24/2024 Discussed the use of AI scribe software for clinical note transcription with the patient, who gave verbal consent to proceed.  History of Present Illness Priscilla Butler is a 73 year old female former smoker followed for a lung nodule which was negative for malignancy on biopsy.  She is here today to follow up on short term surveillance Ct Chest  which was done as short term surveillance.  Repeat CT chest shows the previously noted 1.7 x 1.2 cm left upper lobe nodule is no longer seen. There is a single fiducial marker medial to where the nodule was previously located. There is some scarring near the site. There is some loosely clustered micronodular disease in the posterior periphery of the left lower lobe, first seen on the last CT and not seen in 2009. Suspect these are likely small airway impactions. Largest individual nodule is only 3 mm.  This is great news. Plan will be for a 6 month follow up scan as surveillance to ensure stability.   No issues with breathing, unexplained weight loss, or hemoptysis. She quit smoking approximately 25 years ago. I have asked her to call to be seen sooner for any unexplained weight loss or blood in her sputum when she coughs.     Test Results: 07/11/2024 IMPRESSION: 1. Previously noted 1.7 x 1.2 cm left upper lobe nodule is no longer seen. There is a single fiducial marker medial to where the nodule was previously located. 2. There is a small air diverticulum off the posterior wall of a third or fourth order posterior segment left upper lobe bronchial division where the nodule was previously located. 3. There is linear scar-like opacity extending from the fiducial marker medially to the hilum. 4. There is loosely clustered micronodular disease in the posterior periphery of the left lower lobe, first seen on the last CT and not seen in 2009. Suspect these are likely small airway impactions.  Largest individual nodule is only 3 mm. 5. Aortic and coronary artery atherosclerosis. 6. Stable posttreatment changes in the right breast. 7. Osteopenia, kyphosis and degenerative change.   Cytology 04/03/2024 A.  LEFT LUNG, UPPER LOBE, NODULE, BRUSHING:  - Negative for malignancy  - Numerous benign/reactive bronchial cells with acute inflammatory cells   B.  LEFT LUNG, UPPER LOBE, NODULE, NEEDLE  ASPIRATION  BIOPSY FORCEPS:  - Negative for malignancy  - Scant cellularity; benign bronchial cells  - Benign bronchial mucosa and alveolated lung (cellblock)    All Cultures and Micro were negative     Latest Ref Rng & Units 04/03/2024    6:05 AM 02/28/2024    9:32 AM 07/19/2023   11:03 AM  CBC  WBC 4.0 - 10.5 K/uL 8.2  10.5  6.5   Hemoglobin 12.0 - 15.0 g/dL 86.3  86.4  86.9   Hematocrit 36.0 - 46.0 % 41.4  40.6  39.6   Platelets 150 - 400 K/uL 267  405.0  278.0        Latest Ref Rng & Units 04/03/2024    6:05 AM 02/28/2024    9:32 AM 07/19/2023   11:03 AM  BMP  Glucose 70 - 99 mg/dL 832  847  882   BUN 8 - 23 mg/dL 17  11  15    Creatinine 0.44 - 1.00 mg/dL 9.19  9.29  9.19   Sodium 135 - 145 mmol/L 139  139  140   Potassium 3.5 - 5.1 mmol/L 3.8  4.2  4.2   Chloride 98 - 111 mmol/L 105  102  105   CO2 22 - 32 mmol/L 29  29  28    Calcium  8.9 - 10.3 mg/dL 9.4  9.4  9.3     BNP No results found for: BNP  ProBNP No results found for: PROBNP  PFT No results found for: FEV1PRE, FEV1POST, FVCPRE, FVCPOST, TLC, DLCOUNC, PREFEV1FVCRT, PSTFEV1FVCRT  CT CHEST WO CONTRAST Result Date: 07/19/2024 CLINICAL DATA:  73 year old female with 2009 history of right breast cancer status post lumpectomy and radiation in 2009. She had a 1.7 x 1.2 cm left upper lobe nodule discovered May of this year, then on 04/03/2024 underwent fiducial marker placement and transbronchial biopsy of the nodule with negative pathology results. Presents for follow-up chest CT without contrast. EXAM: CT CHEST WITHOUT CONTRAST TECHNIQUE: Multidetector CT imaging of the chest was performed following the standard protocol without IV contrast. RADIATION DOSE REDUCTION: This exam was performed according to the departmental dose-optimization program which includes automated exposure control, adjustment of the mA and/or kV according to patient size and/or use of iterative reconstruction technique.  COMPARISON:  Chest CT with IV contrast 03/01/2024, chest CT with IV contrast 04/23/2008. FINDINGS: Cardiovascular: The heart is normal in size. There are left main and scattered three-vessel coronary calcifications. No pericardial effusion. Pulmonary arteries and veins are normal caliber. There are mild calcific plaques in the aorta and great vessels without aneurysm. Mediastinum/Nodes: No enlarged mediastinal or axillary lymph nodes. Evaluation of the hila is somewhat limited without contrast but there is no new contour deforming abnormality of the hila. Thyroid  gland, trachea, and esophagus demonstrate no significant findings. Lungs/Pleura: Previously noted left upper lobe 1.7 x 1.2 cm nodule, in the posterior segment of the level of the carina is no longer seen. There is a single fiducial marker medial to where the nodule was previously located. There is a small air diverticulum off the posterior wall of a third or fourth order  posterior segment left upper lobe bronchial division on 4:50 where the nodule was previously located. There is linear scar-like opacity extending from the fiducial marker medially to the hilum. There also was previously 1.1 x 1.2 cm ground-glass nodule laterally in this same area which has clear. There is a stable 4 mm posterior apical left upper lobe nodule on 4: 23, Unchanged from 2009 consistent with a benign entity. There is loosely clustered micronodular disease in the posterior periphery of the left lower lobe on images 60-77, first seen on the last CT and not seen in 2009. Suspect these are likely small airway impactions, unchanged. Largest individual nodule in this area is only 3 mm on 4:68. There are scattered linear scar-like opacities in both bases. There is no consolidation, effusion or pneumothorax. Upper Abdomen: No acute abnormality. Large duodenal diverticulum is partially visible. Musculoskeletal: There is osteopenia, kyphosis and degenerative change of the thoracic spine.  No acute or other significant osseous findings. There are stable posttreatment changes in the right breast, with a stable thick-walled central breast fluid collection 2.2 x 1.7 cm. No new chest wall abnormality. IMPRESSION: 1. Previously noted 1.7 x 1.2 cm left upper lobe nodule is no longer seen. There is a single fiducial marker medial to where the nodule was previously located. 2. There is a small air diverticulum off the posterior wall of a third or fourth order posterior segment left upper lobe bronchial division where the nodule was previously located. 3. There is linear scar-like opacity extending from the fiducial marker medially to the hilum. 4. There is loosely clustered micronodular disease in the posterior periphery of the left lower lobe, first seen on the last CT and not seen in 2009. Suspect these are likely small airway impactions. Largest individual nodule is only 3 mm. 5. Aortic and coronary artery atherosclerosis. 6. Stable posttreatment changes in the right breast. 7. Osteopenia, kyphosis and degenerative change. Aortic Atherosclerosis (ICD10-I70.0). Electronically Signed   By: Francis Quam M.D.   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   DeQuervain's disease (tenosynovitis)    DM (diabetes mellitus), type 2 (HCC)    H/O measles    H/O mumps    History of chicken pox    Hx of TB skin testing    Hyperglycemia    Hyperlipidemia    Menopause    Osteopenia    Otitis externa of left ear 01/30/2017   Preventative health care 01/19/2016   Welcome to Medicare preventive visit 01/27/2017     Social History   Tobacco Use   Smoking status: Former    Current packs/day: 0.00    Average packs/day: 1 pack/day for 33.0 years (33.0 ttl pk-yrs)    Types: Cigarettes     Start date: 43    Quit date: 2001    Years since quitting: 24.7    Passive exposure: Past   Smokeless tobacco: Never  Vaping Use   Vaping status: Never Used  Substance Use Topics   Alcohol use: Yes    Alcohol/week: 3.0 standard drinks of alcohol    Types: 3 Cans of beer per week    Comment: socially   Drug use: No    Ms.Puleo reports that she quit smoking about 24 years  ago. Her smoking use included cigarettes. She started smoking about 57 years ago. She has a 33 pack-year smoking history. She has been exposed to tobacco smoke. She has never used smokeless tobacco. She reports current alcohol use of about 3.0 standard drinks of alcohol per week. She reports that she does not use drugs.  Tobacco Cessation: Counseling given: Not Answered Former smoker , 33 pack year smoking history, quit 2001  Past surgical hx, Family hx, Social hx all reviewed.  Current Outpatient Medications on File Prior to Visit  Medication Sig   amLODipine  (NORVASC ) 5 MG tablet Take 1 tablet (5 mg total) by mouth daily.   aspirin  EC 81 MG tablet Take 1 tablet (81 mg total) by mouth daily.   Black Pepper-Turmeric (TURMERIC COMPLEX/BLACK PEPPER) 3-500 MG CAPS TAKE 1 CAPSULE BY MOUTH 2 (TWO) TIMES DAILY.   Calcium  Carb-Cholecalciferol  (CALCIUM  + VITAMIN D3) 600-10 MG-MCG TABS Take 1 tablet by mouth 2 (two) times daily.   cholecalciferol  (VITAMIN D3) 25 MCG (1000 UNIT) tablet TAKE 1 TABLET BY MOUTH ONCE DAILY   CRANBERRY PO Take 84 mg by mouth daily.   dorzolamide -timolol  (COSOPT ) 2-0.5 % ophthalmic solution Place 1 drop into both eyes 2 (two) times daily.   Fluocinolone  Acetonide 0.01 % OIL Place 1 drop into the ear as directed 2 times daily as needed for itching   glucose blood (ONETOUCH ULTRA) test strip USE TO TEST BLOOD SUGAR TWICE A DAY AS DIRECTED   latanoprost  (XALATAN ) 0.005 % ophthalmic solution Place 1 drop into both eyes at bedtime.   metFORMIN  (GLUCOPHAGE ) 500 MG tablet Take 1 tablet (500 mg total) by  mouth 2 (two) times daily with a meal.   Multiple Vitamin (MULTIVITAMIN) tablet Take 1 tablet by mouth daily.   Omega-3 Fatty Acids (FISH OIL) 1000 MG CAPS Take 1 capsule by mouth 2 (two) times daily.   Probiotic Product (PROBIOTIC DAILY) CAPS Take 1 capsule by mouth daily. [employer] company   Psyllium (METAMUCIL SMOOTH TEXTURE) 28.3 % POWD 1 tsp daily in water   rosuvastatin  (CRESTOR ) 40 MG tablet Take 1 tablet (40 mg total) by mouth daily.   Semaglutide ,0.25 or 0.5MG /DOS, (OZEMPIC , 0.25 OR 0.5 MG/DOSE,) 2 MG/3ML SOPN Inject 0.5 mg into the skin once a week.   sertraline  (ZOLOFT ) 50 MG tablet Take 1 tablet (50 mg total) by mouth daily.   No current facility-administered medications on file prior to visit.     Allergies  Allergen Reactions   Lisinopril  Cough    Review Of Systems:  Constitutional:   No  weight loss, night sweats,  Fevers, chills, fatigue, or  lassitude.  HEENT:   No headaches,  Difficulty swallowing,  Tooth/dental problems, or  Sore throat,                No sneezing, itching, ear ache, nasal congestion, post nasal drip,   CV:  No chest pain,  Orthopnea, PND, swelling in lower extremities, anasarca, dizziness, palpitations, syncope.   GI  No heartburn, indigestion, abdominal pain, nausea, vomiting, diarrhea, change in bowel habits, loss of appetite, bloody stools.   Resp: No shortness of breath with exertion or at rest.  No excess mucus, no productive cough,  No non-productive cough,  No coughing up of blood.  No change in color of mucus.  No wheezing.  No chest wall deformity  Skin: no rash or lesions.  GU: no dysuria, change in color of urine, no urgency or frequency.  No flank pain, no hematuria  MS:  No joint pain or swelling.  No decreased range of motion.  No back pain.  Psych:  No change in mood or affect. No depression or anxiety.  No memory loss.   Vital Signs BP 116/78   Pulse 73   Temp 98 F (36.7 C) (Oral)   Ht 5' 2 (1.575 m)   Wt 158 lb 9.6 oz  (71.9 kg)   SpO2 96%   BMI 29.01 kg/m    Physical Exam:  General- No distress,  A&Ox3, pleasant ENT: No sinus tenderness, TM clear, pale nasal mucosa, no oral exudate,no post nasal drip, no LAN Cardiac: S1, S2, regular rate and rhythm, no murmur Chest: No wheeze/ rales/ dullness; no accessory muscle use, no nasal flaring, no sternal retractions Abd.: Soft Non-tender, ND, BS +, Body mass index is 29.01 kg/m.  Ext: No clubbing cyanosis, edema, no obvious deformities Neuro:  normal strength, MAE x 4, A&O x 3  Skin: No rashes, warm and dry, no obvious skin lesions  Psych: normal mood and behavior  Assessment & Plan Resolved left upper lobe lung nodule Former smoker  History of breast cancer - Schedule follow-up CT scan in March 2026 to monitor disease and scarring. - Advise reporting unexplained weight loss or hemoptysis for earlier evaluation. - Review results one to two weeks post-follow-up scan.  I spent 20 minutes dedicated to the care of this patient on the date of this encounter to include pre-visit review of records, face-to-face time with the patient discussing conditions above, post visit ordering of testing, clinical documentation with the electronic health record, making appropriate referrals as documented, and communicating necessary information to the patient's healthcare team.      Lauraine JULIANNA Lites, NP 07/24/2024  11:23 AM

## 2024-07-24 NOTE — Patient Instructions (Addendum)
 It is good to see you today. We have reviewed your CT chest today. The nodule of concern is actually resolved. This is great news. There is some scarring near the site, which is expected. We will do a 6 month follow up scan to maintain surveillance. This will be March of 2026.  We will see you in the office 1-2 weeks after to review the results. You will get a call to get this scheduled closer to the time. Call for any unexplained weight loss blood in sputum, and we will see you sooner. Please contact office for sooner follow up if symptoms do not improve or worsen or seek emergency care

## 2024-07-25 ENCOUNTER — Other Ambulatory Visit (HOSPITAL_BASED_OUTPATIENT_CLINIC_OR_DEPARTMENT_OTHER): Payer: Self-pay

## 2024-07-25 MED ORDER — COMIRNATY 30 MCG/0.3ML IM SUSY
0.3000 mL | PREFILLED_SYRINGE | Freq: Once | INTRAMUSCULAR | 0 refills | Status: AC
Start: 2024-07-25 — End: 2024-07-26
  Filled 2024-07-25: qty 0.3, 1d supply, fill #0

## 2024-08-19 ENCOUNTER — Telehealth: Payer: Self-pay | Admitting: Pharmacist

## 2024-08-19 MED ORDER — OZEMPIC (0.25 OR 0.5 MG/DOSE) 2 MG/3ML ~~LOC~~ SOPN
0.5000 mg | PEN_INJECTOR | SUBCUTANEOUS | 0 refills | Status: DC
  Filled 2024-08-19 – 2024-08-20 (×2): qty 3, 28d supply, fill #0

## 2024-08-19 NOTE — Progress Notes (Signed)
 Pharmacy Quality Measure Review  This patient is appearing on a report for being at risk of failing the adherence measure for diabetes medications this calendar year.   Medication: Ozmepic Last fill date: 06/25/2024 for 28 day supply per adherence report  Reviewed recent refill history in Dr Annemarie database. Actual last refill date was 07/18/2024 for 28 day supply. Patient has no refills remaining. Next appointment with PCP is 09/24/2024.    Will collaborate with provider to facilitate refill needs. Sent in Rx for 28 Ds to last until she sees PCP in November.   Madelin Ray, PharmD Clinical Pharmacist [facility]  Population Health [phone]

## 2024-08-20 ENCOUNTER — Other Ambulatory Visit: Payer: Self-pay

## 2024-08-20 ENCOUNTER — Other Ambulatory Visit (HOSPITAL_BASED_OUTPATIENT_CLINIC_OR_DEPARTMENT_OTHER): Payer: Self-pay

## 2024-08-24 ENCOUNTER — Other Ambulatory Visit: Payer: Self-pay

## 2024-08-27 ENCOUNTER — Other Ambulatory Visit: Payer: Self-pay

## 2024-08-29 ENCOUNTER — Other Ambulatory Visit: Payer: Self-pay

## 2024-09-11 ENCOUNTER — Telehealth: Payer: Self-pay

## 2024-09-11 ENCOUNTER — Encounter: Payer: Self-pay | Admitting: Family Medicine

## 2024-09-11 DIAGNOSIS — E782 Mixed hyperlipidemia: Secondary | ICD-10-CM

## 2024-09-11 DIAGNOSIS — I10 Essential (primary) hypertension: Secondary | ICD-10-CM

## 2024-09-11 DIAGNOSIS — E119 Type 2 diabetes mellitus without complications: Secondary | ICD-10-CM

## 2024-09-11 NOTE — Telephone Encounter (Signed)
 Patient requested lab order to be completed before her schedule appointment and labs was placed for future order.

## 2024-09-18 ENCOUNTER — Other Ambulatory Visit: Payer: Self-pay

## 2024-09-19 ENCOUNTER — Other Ambulatory Visit (INDEPENDENT_AMBULATORY_CARE_PROVIDER_SITE_OTHER)

## 2024-09-19 ENCOUNTER — Ambulatory Visit: Payer: Self-pay | Admitting: Family Medicine

## 2024-09-19 DIAGNOSIS — I10 Essential (primary) hypertension: Secondary | ICD-10-CM | POA: Diagnosis not present

## 2024-09-19 DIAGNOSIS — E119 Type 2 diabetes mellitus without complications: Secondary | ICD-10-CM

## 2024-09-19 DIAGNOSIS — E782 Mixed hyperlipidemia: Secondary | ICD-10-CM

## 2024-09-19 LAB — CBC WITH DIFFERENTIAL/PLATELET
Basophils Absolute: 0.1 K/uL (ref 0.0–0.1)
Basophils Relative: 1.3 % (ref 0.0–3.0)
Eosinophils Absolute: 0.2 K/uL (ref 0.0–0.7)
Eosinophils Relative: 3.2 % (ref 0.0–5.0)
HCT: 39.7 % (ref 36.0–46.0)
Hemoglobin: 13.2 g/dL (ref 12.0–15.0)
Lymphocytes Relative: 30.1 % (ref 12.0–46.0)
Lymphs Abs: 1.4 K/uL (ref 0.7–4.0)
MCHC: 33.2 g/dL (ref 30.0–36.0)
MCV: 85 fl (ref 78.0–100.0)
Monocytes Absolute: 0.5 K/uL (ref 0.1–1.0)
Monocytes Relative: 9.9 % (ref 3.0–12.0)
Neutro Abs: 2.6 K/uL (ref 1.4–7.7)
Neutrophils Relative %: 55.5 % (ref 43.0–77.0)
Platelets: 261 K/uL (ref 150.0–400.0)
RBC: 4.67 Mil/uL (ref 3.87–5.11)
RDW: 13.5 % (ref 11.5–15.5)
WBC: 4.7 K/uL (ref 4.0–10.5)

## 2024-09-19 LAB — LIPID PANEL
Cholesterol: 118 mg/dL (ref 0–200)
HDL: 48.7 mg/dL (ref >39.00)
LDL Cholesterol: 55 mg/dL (ref 0–99)
NonHDL: 69.77
Total CHOL/HDL Ratio: 2
Triglycerides: 75 mg/dL (ref 0.0–149.0)
VLDL: 15 mg/dL (ref 0.0–40.0)

## 2024-09-19 LAB — COMPREHENSIVE METABOLIC PANEL WITH GFR
ALT: 17 U/L (ref 0–35)
AST: 17 U/L (ref 0–37)
Albumin: 4.6 g/dL (ref 3.5–5.2)
Alkaline Phosphatase: 53 U/L (ref 39–117)
BUN: 15 mg/dL (ref 6–23)
CO2: 29 meq/L (ref 19–32)
Calcium: 9.3 mg/dL (ref 8.4–10.5)
Chloride: 105 meq/L (ref 96–112)
Creatinine, Ser: 0.83 mg/dL (ref 0.40–1.20)
GFR: 69.96 mL/min (ref >60.00)
Glucose, Bld: 138 mg/dL — ABNORMAL HIGH (ref 70–99)
Potassium: 4.2 meq/L (ref 3.5–5.1)
Sodium: 141 meq/L (ref 135–145)
Total Bilirubin: 1 mg/dL (ref 0.2–1.2)
Total Protein: 6.6 g/dL (ref 6.0–8.3)

## 2024-09-19 LAB — TSH: TSH: 1.21 u[IU]/mL (ref 0.35–5.50)

## 2024-09-19 LAB — HEMOGLOBIN A1C: Hgb A1c MFr Bld: 6.7 % — ABNORMAL HIGH (ref 4.6–6.5)

## 2024-09-23 NOTE — Assessment & Plan Note (Signed)
hgba1c elevated, minimize simple carbs. Increase exercise as tolerated. Continue current meds including and titrate up as needed and as tolerated.

## 2024-09-23 NOTE — Assessment & Plan Note (Signed)
 Tolerating statin, encouraged heart healthy diet, avoid trans fats, minimize simple carbs and saturated fats. Increase exercise as tolerated

## 2024-09-23 NOTE — Assessment & Plan Note (Signed)
 Well controlled, no changes to meds. Encouraged heart healthy diet such as the DASH diet and exercise as tolerated.

## 2024-09-23 NOTE — Assessment & Plan Note (Signed)
 Encouraged to get adequate exercise, calcium and vitamin d intake

## 2024-09-24 ENCOUNTER — Ambulatory Visit: Admitting: Family Medicine

## 2024-09-24 ENCOUNTER — Other Ambulatory Visit (HOSPITAL_BASED_OUTPATIENT_CLINIC_OR_DEPARTMENT_OTHER): Payer: Self-pay

## 2024-09-24 VITALS — BP 134/82 | HR 70 | Temp 97.9°F | Resp 16 | Ht 62.0 in | Wt 162.2 lb

## 2024-09-24 DIAGNOSIS — E782 Mixed hyperlipidemia: Secondary | ICD-10-CM | POA: Diagnosis not present

## 2024-09-24 DIAGNOSIS — Z23 Encounter for immunization: Secondary | ICD-10-CM

## 2024-09-24 DIAGNOSIS — I10 Essential (primary) hypertension: Secondary | ICD-10-CM | POA: Diagnosis not present

## 2024-09-24 DIAGNOSIS — M8580 Other specified disorders of bone density and structure, unspecified site: Secondary | ICD-10-CM | POA: Diagnosis not present

## 2024-09-24 DIAGNOSIS — E119 Type 2 diabetes mellitus without complications: Secondary | ICD-10-CM | POA: Diagnosis not present

## 2024-09-24 MED ORDER — HYDROXYZINE HCL 10 MG PO TABS
10.0000 mg | ORAL_TABLET | Freq: Three times a day (TID) | ORAL | 2 refills | Status: AC | PRN
  Filled 2024-09-24: qty 30, 5d supply, fill #0

## 2024-09-24 NOTE — Patient Instructions (Signed)
 Sarna anti itch lotion, benadryl cream and/or witch hazel astringent for itching

## 2024-09-24 NOTE — Progress Notes (Signed)
 Subjective:    Patient ID: Priscilla Butler, female    DOB: 09/07/1951, 73 y.o.   MRN: [id number]  Chief Complaint  Patient presents with   Medical Management of Chronic Issues    Patient presents today for a 6 month f/u   Quality Metric Gaps    Foot exam    HPI Discussed the use of AI scribe software for clinical note transcription with the patient, who gave verbal consent to proceed.  History of Present Illness Priscilla Butler is a 73 year old female who presents with intermittent itching of the hands and feet.  She has been experiencing intermittent itching for about a month, primarily affecting her hands and feet, though not simultaneously. Occasionally, other areas such as her legs are affected. The itching is described as intense, occurring sporadically, and sometimes associated with stress or overheating. There is no visible rash accompanying the itching. She has been using Benadryl for relief, which she finds effective. She recalls an incident where her hand swelled significantly, possibly due to a bug bite, which responded well to antihistamines like Benadryl or Zyrtec.  She expresses a need for medication to manage occasional anxiety, which she experiences about once a week. Her symptoms are not daily but occur sporadically.  She is retired and provides care for her 52 year old mother, with additional help from caregivers. She recently decided against attending a class reunion due to personal and family considerations. She reports normal bowel function with occasional disruptions.    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   DeQuervain's disease (tenosynovitis)    DM (diabetes mellitus), type 2 (HCC)    H/O measles    H/O mumps    History of chicken pox    Hx of TB skin testing    Hyperglycemia     Hyperlipidemia    Menopause    Osteopenia    Otitis externa of left ear 01/30/2017   Preventative health care 01/19/2016   Welcome to Medicare preventive visit 01/27/2017    Past Surgical History:  Procedure Laterality Date   BREAST LUMPECTOMY  03/2008   Right breast   BRONCHIAL BIOPSY  04/03/2024   Procedure: BRONCHOSCOPY, WITH BIOPSY;  Surgeon: Shelah Lamar RAMAN, MD;  Location: [facility] ENDOSCOPY;  Service: Pulmonary;;   BRONCHIAL BRUSHINGS  04/03/2024   Procedure: BRONCHOSCOPY, WITH BRUSH BIOPSY;  Surgeon: Shelah Lamar RAMAN, MD;  Location: [facility] ENDOSCOPY;  Service: Pulmonary;;   BRONCHIAL NEEDLE ASPIRATION BIOPSY  04/03/2024   Procedure: BRONCHOSCOPY, WITH NEEDLE ASPIRATION BIOPSY;  Surgeon: Shelah Lamar RAMAN, MD;  Location: [facility] ENDOSCOPY;  Service: Pulmonary;;   FIDUCIAL MARKER PLACEMENT  04/03/2024   Procedure: INSERTION, FIDUCIAL MARKERS;  Surgeon: Shelah Lamar RAMAN, MD;  Location: [facility] ENDOSCOPY;  Service: Pulmonary;;   MOHS SURGERY  2013   BCC right upper lip, [place] skin surgery   SKIN SURGERY     facial  skin cancer   VIDEO BRONCHOSCOPY WITH ENDOBRONCHIAL NAVIGATION Bilateral 04/03/2024   Procedure: VIDEO BRONCHOSCOPY WITH ENDOBRONCHIAL NAVIGATION;  Surgeon: Shelah Lamar RAMAN, MD;  Location: [facility] ENDOSCOPY;  Service: Pulmonary;  Laterality: Bilateral;  with flouro   WISDOM TOOTH EXTRACTION  1972    Family History  Problem Relation Age of Onset   Breast cancer Maternal Grandmother    Cancer Maternal Grandmother 22  ish   Cancer Maternal Grandfather        lung   Hypertension Mother    Hyperlipidemia Mother    Arthritis Mother    Other Paternal Grandmother     Social History   Socioeconomic History   Marital status: Married    Spouse name: Candance   Number of children: 1   Years of education: BA   Highest education level: Bachelor's degree (e.g., BA, AB, BS)  Occupational History   Occupation: Retired    Associate Professor: UNEMPLOYED  Tobacco Use   Smoking status: Former    Current  packs/day: 0.00    Average packs/day: 1 pack/day for 33.0 years (33.0 ttl pk-yrs)    Types: Cigarettes    Start date: 19    Quit date: 2001    Years since quitting: 24.9    Passive exposure: Past   Smokeless tobacco: Never  Vaping Use   Vaping status: Never Used  Substance and Sexual Activity   Alcohol use: Yes    Alcohol/week: 3.0 standard drinks of alcohol    Types: 3 Cans of beer per week    Comment: socially   Drug use: No   Sexual activity: Yes    Partners: Male    Birth control/protection: Post-menopausal    Comment: lives with husband, retired from audiological scientist, Engineer, Agricultural, No dietary restrictions  Other Topics Concern   Not on file  Social History Narrative   Not on file   Social Drivers of Health   Financial Resource Strain: Low Risk  (09/24/2024)   Overall Financial Resource Strain (CARDIA)    Difficulty of Paying Living Expenses: Not hard at all  Food Insecurity: No Food Insecurity (09/24/2024)   Hunger Vital Sign    Worried About Running Out of Food in the Last Year: Never true    Ran Out of Food in the Last Year: Never true  Transportation Needs: No Transportation Needs (09/24/2024)   PRAPARE - Administrator, Civil Service (Medical): No    Lack of Transportation (Non-Medical): No  Physical Activity: Insufficiently Active (09/24/2024)   Exercise Vital Sign    Days of Exercise per Week: 1 day    Minutes of Exercise per Session: 20 min  Stress: No Stress Concern Present (09/24/2024)   [employer] of Occupational Health - Occupational Stress Questionnaire    Feeling of Stress: Only a little  Social Connections: Moderately Integrated (09/24/2024)   Social Connection and Isolation Panel    Frequency of Communication with Friends and Family: Twice a week    Frequency of Social Gatherings with Friends and Family: Once a week    Attends Religious Services: 1 to 4 times per year    Active Member of [employer] or Organizations: No    Attends Probation Officer: Not on file    Marital Status: Married  Recent Concern: Social Connections - Moderately Isolated (07/20/2024)   Social Connection and Isolation Panel    Frequency of Communication with Friends and Family: Once a week    Frequency of Social Gatherings with Friends and Family: Once a week    Attends Religious Services: 1 to 4 times per year    Active Member of [employer] or Organizations: No    Attends Banker Meetings: Not on file    Marital Status: Married  Intimate Partner Violence: Not At Risk (07/20/2024)   Humiliation, Afraid, Rape, and Kick questionnaire    Fear of Current or Ex-Partner: No  Emotionally Abused: No    Physically Abused: No    Sexually Abused: No    Outpatient Medications Prior to Visit  Medication Sig Dispense Refill   amLODipine  (NORVASC ) 5 MG tablet Take 1 tablet (5 mg total) by mouth daily. 90 tablet 1   aspirin  EC 81 MG tablet Take 1 tablet (81 mg total) by mouth daily. 90 tablet 3   Black Pepper-Turmeric (TURMERIC COMPLEX/BLACK PEPPER) 3-500 MG CAPS TAKE 1 CAPSULE BY MOUTH 2 (TWO) TIMES DAILY. 180 capsule 1   Calcium  Carb-Cholecalciferol  (CALCIUM  + VITAMIN D3) 600-10 MG-MCG TABS Take 1 tablet by mouth 2 (two) times daily. 180 tablet 1   cholecalciferol  (VITAMIN D3) 25 MCG (1000 UNIT) tablet TAKE 1 TABLET BY MOUTH ONCE DAILY 100 tablet 1   CRANBERRY PO Take 84 mg by mouth daily.     dorzolamide -timolol  (COSOPT ) 2-0.5 % ophthalmic solution Place 1 drop into both eyes 2 (two) times daily. 20 mL 3   Fluocinolone  Acetonide 0.01 % OIL Place 1 drop into the ear as directed 2 times daily as needed for itching 20 mL 1   glucose blood (ONETOUCH ULTRA) test strip USE TO TEST BLOOD SUGAR TWICE A DAY AS DIRECTED 200 strip 12   latanoprost  (XALATAN ) 0.005 % ophthalmic solution Place 1 drop into both eyes at bedtime. 10 mL 3   metFORMIN  (GLUCOPHAGE ) 500 MG tablet Take 1 tablet (500 mg total) by mouth 2 (two) times daily with a meal. 180  tablet 1   Multiple Vitamin (MULTIVITAMIN) tablet Take 1 tablet by mouth daily.     Omega-3 Fatty Acids (FISH OIL) 1000 MG CAPS Take 1 capsule by mouth 2 (two) times daily.     Probiotic Product (PROBIOTIC DAILY) CAPS Take 1 capsule by mouth daily. NOW company     Psyllium (METAMUCIL SMOOTH TEXTURE) 28.3 % POWD 1 tsp daily in water     rosuvastatin  (CRESTOR ) 40 MG tablet Take 1 tablet (40 mg total) by mouth daily. 90 tablet 1   Semaglutide ,0.25 or 0.5MG /DOS, (OZEMPIC , 0.25 OR 0.5 MG/DOSE,) 2 MG/3ML SOPN Inject 0.5 mg into the skin once a week. 3 mL 0   sertraline  (ZOLOFT ) 50 MG tablet Take 1 tablet (50 mg total) by mouth daily. 90 tablet 1   No facility-administered medications prior to visit.    Allergies  Allergen Reactions   Lisinopril  Cough    Review of Systems  Constitutional:  Positive for malaise/fatigue. Negative for fever.  HENT:  Negative for congestion.   Eyes:  Negative for blurred vision.  Respiratory:  Negative for shortness of breath.   Cardiovascular:  Negative for chest pain, palpitations and leg swelling.  Gastrointestinal:  Negative for abdominal pain, blood in stool and nausea.  Genitourinary:  Negative for dysuria and frequency.  Musculoskeletal:  Negative for falls.  Skin:  Positive for itching. Negative for rash.  Neurological:  Negative for dizziness, loss of consciousness and headaches.  Endo/Heme/Allergies:  Negative for environmental allergies.  Psychiatric/Behavioral:  Negative for depression. The patient is nervous/anxious.        Objective:    Physical Exam Constitutional:      General: She is not in acute distress.    Appearance: Normal appearance. She is well-developed. She is not toxic-appearing.  HENT:     Head: Normocephalic and atraumatic.     Right Ear: External ear normal.     Left Ear: External ear normal.     Nose: Nose normal.  Eyes:     General:  Right eye: No discharge.        Left eye: No discharge.      Conjunctiva/sclera: Conjunctivae normal.  Neck:     Thyroid : No thyromegaly.  Cardiovascular:     Rate and Rhythm: Normal rate and regular rhythm.     Heart sounds: Normal heart sounds. No murmur heard. Pulmonary:     Effort: Pulmonary effort is normal. No respiratory distress.     Breath sounds: Normal breath sounds.  Abdominal:     General: Bowel sounds are normal.     Palpations: Abdomen is soft.     Tenderness: There is no abdominal tenderness. There is no guarding.  Musculoskeletal:        General: Normal range of motion.     Cervical back: Neck supple.  Lymphadenopathy:     Cervical: No cervical adenopathy.  Skin:    General: Skin is warm and dry.  Neurological:     Mental Status: She is alert and oriented to person, place, and time.  Psychiatric:        Mood and Affect: Mood normal.        Behavior: Behavior normal.        Thought Content: Thought content normal.        Judgment: Judgment normal.     BP 134/82   Pulse 70   Temp 97.9 F (36.6 C)   Resp 16   Ht 5' 2 (1.575 m)   Wt 162 lb 3.2 oz (73.6 kg)   SpO2 96%   BMI 29.67 kg/m  Wt Readings from Last 3 Encounters:  09/24/24 162 lb 3.2 oz (73.6 kg)  07/24/24 158 lb 9.6 oz (71.9 kg)  07/20/24 161 lb (73 kg)    Diabetic Foot Exam - Simple   Simple Foot Form Diabetic Foot exam was performed with the following findings: Yes 09/24/2024  4:16 PM  Visual Inspection No deformities, no ulcerations, no other skin breakdown bilaterally: Yes Sensation Testing Intact to touch and monofilament testing bilaterally: Yes Pulse Check Posterior Tibialis and Dorsalis pulse intact bilaterally: Yes Comments    Lab Results  Component Value Date   WBC 4.7 09/19/2024   HGB 13.2 09/19/2024   HCT 39.7 09/19/2024   PLT 261.0 09/19/2024   GLUCOSE 138 (H) 09/19/2024   CHOL 118 09/19/2024   TRIG 75.0 09/19/2024   HDL 48.70 09/19/2024   LDLCALC 55 09/19/2024   ALT 17 09/19/2024   AST 17 09/19/2024   NA 141 09/19/2024    K 4.2 09/19/2024   CL 105 09/19/2024   CREATININE 0.83 09/19/2024   BUN 15 09/19/2024   CO2 29 09/19/2024   TSH 1.21 09/19/2024   HGBA1C 6.7 (H) 09/19/2024   MICROALBUR 0.7 03/05/2024    Lab Results  Component Value Date   TSH 1.21 09/19/2024   Lab Results  Component Value Date   WBC 4.7 09/19/2024   HGB 13.2 09/19/2024   HCT 39.7 09/19/2024   MCV 85.0 09/19/2024   PLT 261.0 09/19/2024   Lab Results  Component Value Date   NA 141 09/19/2024   K 4.2 09/19/2024   CO2 29 09/19/2024   GLUCOSE 138 (H) 09/19/2024   BUN 15 09/19/2024   CREATININE 0.83 09/19/2024   BILITOT 1.0 09/19/2024   ALKPHOS 53 09/19/2024   AST 17 09/19/2024   ALT 17 09/19/2024   PROT 6.6 09/19/2024   ALBUMIN 4.6 09/19/2024   CALCIUM  9.3 09/19/2024   ANIONGAP 5 04/03/2024   GFR 69.96 09/19/2024  Lab Results  Component Value Date   CHOL 118 09/19/2024   Lab Results  Component Value Date   HDL 48.70 09/19/2024   Lab Results  Component Value Date   LDLCALC 55 09/19/2024   Lab Results  Component Value Date   TRIG 75.0 09/19/2024   Lab Results  Component Value Date   CHOLHDL 2 09/19/2024   Lab Results  Component Value Date   HGBA1C 6.7 (H) 09/19/2024       Assessment & Plan:  Osteopenia, unspecified location Assessment & Plan: Encouraged to get adequate exercise, calcium  and vitamin d  intake    Mixed hyperlipidemia Assessment & Plan: Tolerating statin, encouraged heart healthy diet, avoid trans fats, minimize simple carbs and saturated fats. Increase exercise as tolerated   Type 2 diabetes mellitus without complication, without long-term current use of insulin  [facility]) Assessment & Plan: hgba1c elevated, minimize simple carbs. Increase exercise as tolerated. Continue current meds including and titrate up as needed and as tolerated.    Hypertension, unspecified type Assessment & Plan: Well controlled, no changes to meds. Encouraged heart healthy diet such as the DASH diet  and exercise as tolerated.     Need for pneumococcal 20-valent conjugate vaccination -     Pneumococcal conjugate vaccine 20-valent  Other orders -     hydrOXYzine  HCl; Take 1-2 tablets (10-20 mg total) by mouth every 8 (eight) hours as needed.  Dispense: 30 tablet; Refill: 2    Assessment and Plan Assessment & Plan Intermittent pruritus of hands and feet Intermittent pruritus of hands and feet, occurring randomly without visible rash. Symptoms are exacerbated by stress and possibly heat. Differential includes histaminic response due to stress or external triggers such as bug bites or food. No consistent pattern identified. - Use Benadryl cream with hazel astringent for topical relief. - Consider Sarna lotion for anti-itch relief. - If symptoms escalate, consider using Zyrtec or Benadryl twice daily and Pepcid twice daily for severe reactions. - Consult dermatologist if symptoms worsen.  Episodic anxiety Episodic anxiety, occurring approximately once a week, not severe enough for daily medication. Hydroxyzine  chosen for its dual benefit in managing anxiety and itching. Discussed potential sedative effects and alternative options if needed. - Prescribed hydroxyzine  10 mg, to be taken as needed, up to three times a day. - Monitor response to hydroxyzine  and adjust treatment if necessary. - Scheduled follow-up in 3-4 months to assess effectiveness and adjust treatment plan.  General Health Maintenance Vaccination status reviewed. Prevnar 20 recommended due to improved coverage of pneumococcal strains compared to previous vaccines. - Administered Prevnar 20 vaccine in the office.  Recording duration: 27 minutes     Harlene Horton, MD

## 2024-10-16 ENCOUNTER — Other Ambulatory Visit: Payer: Self-pay

## 2024-10-16 ENCOUNTER — Other Ambulatory Visit: Payer: Self-pay | Admitting: Family Medicine

## 2024-10-16 ENCOUNTER — Other Ambulatory Visit (HOSPITAL_COMMUNITY): Payer: Self-pay

## 2024-10-16 MED ORDER — METFORMIN HCL 500 MG PO TABS
500.0000 mg | ORAL_TABLET | Freq: Two times a day (BID) | ORAL | 1 refills | Status: AC
  Filled 2024-10-16: qty 180, 90d supply, fill #0

## 2024-10-16 MED ORDER — ROSUVASTATIN CALCIUM 40 MG PO TABS
40.0000 mg | ORAL_TABLET | Freq: Every day | ORAL | 1 refills | Status: AC
  Filled 2024-10-16: qty 90, 90d supply, fill #0

## 2024-10-16 MED ORDER — OZEMPIC (0.25 OR 0.5 MG/DOSE) 2 MG/3ML ~~LOC~~ SOPN
0.5000 mg | PEN_INJECTOR | SUBCUTANEOUS | 0 refills | Status: AC
  Filled 2024-10-16: qty 9, 84d supply, fill #0

## 2024-10-16 MED ORDER — AMLODIPINE BESYLATE 5 MG PO TABS
5.0000 mg | ORAL_TABLET | Freq: Every day | ORAL | 1 refills | Status: AC
  Filled 2024-10-16: qty 90, 90d supply, fill #0

## 2024-11-27 ENCOUNTER — Other Ambulatory Visit: Payer: Self-pay

## 2024-11-28 ENCOUNTER — Other Ambulatory Visit (HOSPITAL_COMMUNITY): Payer: Self-pay

## 2024-11-28 ENCOUNTER — Other Ambulatory Visit: Payer: Self-pay

## 2024-11-28 MED ORDER — DORZOLAMIDE HCL-TIMOLOL MAL 2-0.5 % OP SOLN
1.0000 [drp] | Freq: Two times a day (BID) | OPHTHALMIC | 3 refills | Status: AC
  Filled 2024-11-28: qty 20, 100d supply, fill #0

## 2025-02-01 ENCOUNTER — Ambulatory Visit: Admitting: Acute Care

## 2025-02-04 ENCOUNTER — Ambulatory Visit: Admitting: Family Medicine

## 2025-03-07 ENCOUNTER — Encounter: Admitting: Family Medicine

## 2025-06-10 ENCOUNTER — Encounter: Admitting: Family Medicine

## 2025-07-01 ENCOUNTER — Encounter: Admitting: Family Medicine

## 2025-07-26 ENCOUNTER — Ambulatory Visit
